# Patient Record
Sex: Male | Born: 1980 | Hispanic: Yes | Marital: Single | Smoking: Current every day smoker
Health system: Southern US, Community
[De-identification: ages and names within clinical notes are randomized; demographics above are authoritative.]

## PROBLEM LIST (undated history)

## (undated) DIAGNOSIS — B20 Human immunodeficiency virus [HIV] disease: Secondary | ICD-10-CM

## (undated) DIAGNOSIS — F410 Panic disorder [episodic paroxysmal anxiety] without agoraphobia: Secondary | ICD-10-CM

## (undated) DIAGNOSIS — R569 Unspecified convulsions: Secondary | ICD-10-CM

## (undated) DIAGNOSIS — G40909 Epilepsy, unspecified, not intractable, without status epilepticus: Secondary | ICD-10-CM

## (undated) DIAGNOSIS — I10 Essential (primary) hypertension: Secondary | ICD-10-CM

## (undated) DIAGNOSIS — Z21 Asymptomatic human immunodeficiency virus [HIV] infection status: Secondary | ICD-10-CM

## (undated) DIAGNOSIS — F419 Anxiety disorder, unspecified: Secondary | ICD-10-CM

## (undated) DIAGNOSIS — G43909 Migraine, unspecified, not intractable, without status migrainosus: Secondary | ICD-10-CM

## (undated) HISTORY — PX: ORIF CLAVICULAR FRACTURE: SHX5055

---

## 2014-07-14 ENCOUNTER — Emergency Department (HOSPITAL_BASED_OUTPATIENT_CLINIC_OR_DEPARTMENT_OTHER): Payer: PRIVATE HEALTH INSURANCE

## 2014-07-14 ENCOUNTER — Encounter (HOSPITAL_BASED_OUTPATIENT_CLINIC_OR_DEPARTMENT_OTHER): Payer: Self-pay | Admitting: Emergency Medicine

## 2014-07-14 ENCOUNTER — Emergency Department (HOSPITAL_BASED_OUTPATIENT_CLINIC_OR_DEPARTMENT_OTHER)
Admission: EM | Admit: 2014-07-14 | Discharge: 2014-07-14 | Disposition: A | Discharge status: Home / Self Care | Payer: PRIVATE HEALTH INSURANCE | Attending: Emergency Medicine | Admitting: Emergency Medicine

## 2014-07-14 DIAGNOSIS — S5401XA Injury of ulnar nerve at forearm level, right arm, initial encounter: Secondary | ICD-10-CM

## 2014-07-14 DIAGNOSIS — Z820 Family history of epilepsy and other diseases of the nervous system: Secondary | ICD-10-CM | POA: Insufficient documentation

## 2014-07-14 DIAGNOSIS — F419 Anxiety disorder, unspecified: Secondary | ICD-10-CM | POA: Insufficient documentation

## 2014-07-14 DIAGNOSIS — Z880 Allergy status to penicillin: Secondary | ICD-10-CM | POA: Insufficient documentation

## 2014-07-14 DIAGNOSIS — Z23 Encounter for immunization: Secondary | ICD-10-CM | POA: Insufficient documentation

## 2014-07-14 DIAGNOSIS — S4361XA Sprain of right sternoclavicular joint, initial encounter: Secondary | ICD-10-CM

## 2014-07-14 DIAGNOSIS — Z8669 Personal history of other diseases of the nervous system and sense organs: Secondary | ICD-10-CM | POA: Insufficient documentation

## 2014-07-14 DIAGNOSIS — Z791 Long term (current) use of non-steroidal anti-inflammatories (NSAID): Secondary | ICD-10-CM | POA: Insufficient documentation

## 2014-07-14 DIAGNOSIS — Z79899 Other long term (current) drug therapy: Secondary | ICD-10-CM | POA: Insufficient documentation

## 2014-07-14 DIAGNOSIS — S4991XA Unspecified injury of right shoulder and upper arm, initial encounter: Secondary | ICD-10-CM | POA: Insufficient documentation

## 2014-07-14 DIAGNOSIS — Z21 Asymptomatic human immunodeficiency virus [HIV] infection status: Secondary | ICD-10-CM | POA: Insufficient documentation

## 2014-07-14 DIAGNOSIS — I10 Essential (primary) hypertension: Secondary | ICD-10-CM | POA: Insufficient documentation

## 2014-07-14 HISTORY — DX: Anxiety disorder, unspecified: F41.9

## 2014-07-14 HISTORY — DX: Unspecified convulsions: R56.9

## 2014-07-14 HISTORY — DX: Asymptomatic human immunodeficiency virus (hiv) infection status: Z21

## 2014-07-14 HISTORY — DX: Human immunodeficiency virus (HIV) disease: B20

## 2014-07-14 HISTORY — DX: Essential (primary) hypertension: I10

## 2014-07-14 MED ORDER — FENTANYL CITRATE 0.05 MG/ML IJ SOLN
100.0000 ug | Freq: Once | INTRAMUSCULAR | Status: AC
  Administered 2014-07-14: 100 ug via INTRAVENOUS
  Filled 2014-07-14: qty 2

## 2014-07-14 MED ORDER — TETANUS-DIPHTH-ACELL PERTUSSIS 5-2.5-18.5 LF-MCG/0.5 IM SUSP
0.5000 mL | Freq: Once | INTRAMUSCULAR | Status: AC
  Administered 2014-07-14: 0.5 mL via INTRAMUSCULAR
  Filled 2014-07-14: qty 0.5

## 2014-07-14 MED ORDER — HYDROCODONE-ACETAMINOPHEN 5-325 MG PO TABS: 1.0000 | ORAL_TABLET | Freq: Four times a day (QID) | ORAL | Status: DC | PRN

## 2014-07-14 MED ORDER — LORAZEPAM 2 MG/ML IJ SOLN
INTRAMUSCULAR | Status: AC
  Filled 2014-07-14: qty 1

## 2014-07-14 MED ORDER — LORAZEPAM 2 MG/ML IJ SOLN
2.0000 mg | Freq: Once | INTRAMUSCULAR | Status: AC
  Administered 2014-07-14: 2 mg via INTRAVENOUS
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
## 2014-07-14 NOTE — ED Notes (Signed)
Patient transported to CT 
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
## 2014-07-17 ENCOUNTER — Inpatient Hospital Stay (HOSPITAL_COMMUNITY)
Admission: EM | Admit: 2014-07-17 | Discharge: 2014-07-19 | DRG: 100 | Disposition: A | Discharge status: Home / Self Care | Payer: PRIVATE HEALTH INSURANCE | Attending: Internal Medicine | Admitting: Internal Medicine

## 2014-07-17 ENCOUNTER — Emergency Department (HOSPITAL_COMMUNITY): Payer: PRIVATE HEALTH INSURANCE

## 2014-07-17 ENCOUNTER — Encounter (HOSPITAL_COMMUNITY): Payer: Self-pay | Admitting: Emergency Medicine

## 2014-07-17 ENCOUNTER — Inpatient Hospital Stay (HOSPITAL_COMMUNITY): Payer: PRIVATE HEALTH INSURANCE

## 2014-07-17 DIAGNOSIS — Z79899 Other long term (current) drug therapy: Secondary | ICD-10-CM

## 2014-07-17 DIAGNOSIS — E870 Hyperosmolality and hypernatremia: Secondary | ICD-10-CM | POA: Diagnosis present

## 2014-07-17 DIAGNOSIS — D649 Anemia, unspecified: Secondary | ICD-10-CM | POA: Diagnosis not present

## 2014-07-17 DIAGNOSIS — F17210 Nicotine dependence, cigarettes, uncomplicated: Secondary | ICD-10-CM | POA: Diagnosis present

## 2014-07-17 DIAGNOSIS — E876 Hypokalemia: Secondary | ICD-10-CM | POA: Diagnosis not present

## 2014-07-17 DIAGNOSIS — R569 Unspecified convulsions: Secondary | ICD-10-CM

## 2014-07-17 DIAGNOSIS — B20 Human immunodeficiency virus [HIV] disease: Secondary | ICD-10-CM

## 2014-07-17 DIAGNOSIS — F445 Conversion disorder with seizures or convulsions: Secondary | ICD-10-CM

## 2014-07-17 DIAGNOSIS — Z21 Asymptomatic human immunodeficiency virus [HIV] infection status: Secondary | ICD-10-CM | POA: Diagnosis present

## 2014-07-17 DIAGNOSIS — I10 Essential (primary) hypertension: Secondary | ICD-10-CM | POA: Diagnosis present

## 2014-07-17 DIAGNOSIS — G40301 Generalized idiopathic epilepsy and epileptic syndromes, not intractable, with status epilepticus: Secondary | ICD-10-CM

## 2014-07-17 DIAGNOSIS — J9610 Chronic respiratory failure, unspecified whether with hypoxia or hypercapnia: Secondary | ICD-10-CM

## 2014-07-17 DIAGNOSIS — Z9114 Patient's other noncompliance with medication regimen: Secondary | ICD-10-CM | POA: Diagnosis present

## 2014-07-17 DIAGNOSIS — G40901 Epilepsy, unspecified, not intractable, with status epilepticus: Principal | ICD-10-CM | POA: Diagnosis present

## 2014-07-17 DIAGNOSIS — J9601 Acute respiratory failure with hypoxia: Secondary | ICD-10-CM | POA: Diagnosis present

## 2014-07-17 LAB — I-STAT ARTERIAL BLOOD GAS, ED
BICARBONATE: 27.1 meq/L — AB (ref 20.0–24.0)
O2 Saturation: 63 %
PO2 ART: 35 mmHg — AB (ref 80.0–100.0)
TCO2: 29 mmol/L (ref 0–100)
pCO2 arterial: 50.7 mmHg — ABNORMAL HIGH (ref 35.0–45.0)
pH, Arterial: 7.336 — ABNORMAL LOW (ref 7.350–7.450)

## 2014-07-17 LAB — COMPREHENSIVE METABOLIC PANEL
ALT: 48 U/L (ref 0–53)
AST: 26 U/L (ref 0–37)
Albumin: 4 g/dL (ref 3.5–5.2)
Alkaline Phosphatase: 101 U/L (ref 39–117)
Anion gap: 12 (ref 5–15)
BUN: 11 mg/dL (ref 6–23)
CO2: 28 mEq/L (ref 19–32)
Calcium: 9.5 mg/dL (ref 8.4–10.5)
Chloride: 108 mEq/L (ref 96–112)
Creatinine, Ser: 0.67 mg/dL (ref 0.50–1.35)
GFR calc Af Amer: >90 mL/min (ref >90)
GFR calc non Af Amer: >90 mL/min (ref >90)
Glucose, Bld: 128 mg/dL — ABNORMAL HIGH (ref 70–99)
Potassium: 4.6 mEq/L (ref 3.7–5.3)
SODIUM: 148 meq/L — AB (ref 137–147)
Total Bilirubin: <0.2 mg/dL — ABNORMAL LOW (ref 0.3–1.2)
Total Protein: 8 g/dL (ref 6.0–8.3)

## 2014-07-17 LAB — RAPID URINE DRUG SCREEN, HOSP PERFORMED
AMPHETAMINES: NOT DETECTED
BENZODIAZEPINES: POSITIVE — AB
Barbiturates: NOT DETECTED
Cocaine: NOT DETECTED
OPIATES: NOT DETECTED
Tetrahydrocannabinol: NOT DETECTED

## 2014-07-17 LAB — URINE MICROSCOPIC-ADD ON

## 2014-07-17 LAB — CBG MONITORING, ED: Glucose-Capillary: 141 mg/dL — ABNORMAL HIGH (ref 70–99)

## 2014-07-17 LAB — URINALYSIS, ROUTINE W REFLEX MICROSCOPIC
Bilirubin Urine: NEGATIVE
GLUCOSE, UA: NEGATIVE mg/dL
Ketones, ur: NEGATIVE mg/dL
Leukocytes, UA: NEGATIVE
Nitrite: NEGATIVE
PH: 6 (ref 5.0–8.0)
PROTEIN: NEGATIVE mg/dL
Specific Gravity, Urine: 1.021 (ref 1.005–1.030)
Urobilinogen, UA: 0.2 mg/dL (ref 0.0–1.0)

## 2014-07-17 LAB — TROPONIN I

## 2014-07-17 LAB — CBC WITH DIFFERENTIAL/PLATELET
Basophils Absolute: 0 10*3/uL (ref 0.0–0.1)
Basophils Relative: 0 % (ref 0–1)
Eosinophils Absolute: 0 10*3/uL (ref 0.0–0.7)
Eosinophils Relative: 0 % (ref 0–5)
HCT: 42.9 % (ref 39.0–52.0)
Hemoglobin: 14.4 g/dL (ref 13.0–17.0)
Lymphocytes Relative: 35 % (ref 12–46)
Lymphs Abs: 2.5 10*3/uL (ref 0.7–4.0)
MCH: 31.6 pg (ref 26.0–34.0)
MCHC: 33.6 g/dL (ref 30.0–36.0)
MCV: 94.1 fL (ref 78.0–100.0)
Monocytes Absolute: 0.5 10*3/uL (ref 0.1–1.0)
Monocytes Relative: 6 % (ref 3–12)
Neutro Abs: 4.1 10*3/uL (ref 1.7–7.7)
Neutrophils Relative %: 59 % (ref 43–77)
Platelets: 222 10*3/uL (ref 150–400)
RBC: 4.56 MIL/uL (ref 4.22–5.81)
RDW: 14 % (ref 11.5–15.5)
WBC: 7 10*3/uL (ref 4.0–10.5)

## 2014-07-17 LAB — MRSA PCR SCREENING: MRSA by PCR: NEGATIVE

## 2014-07-17 LAB — I-STAT CG4 LACTIC ACID, ED: Lactic Acid, Venous: 4.03 mmol/L — ABNORMAL HIGH (ref 0.5–2.2)

## 2014-07-17 LAB — CK: CK TOTAL: 189 U/L (ref 7–232)

## 2014-07-17 LAB — PHENYTOIN LEVEL, TOTAL: Phenytoin Lvl: 4.9 ug/mL — ABNORMAL LOW (ref 10.0–20.0)

## 2014-07-17 MED ORDER — ONDANSETRON HCL 4 MG/2ML IJ SOLN
4.0000 mg | Freq: Once | INTRAMUSCULAR | Status: AC
  Administered 2014-07-17: 4 mg via INTRAVENOUS
  Filled 2014-07-17 (×2): qty 2

## 2014-07-17 MED ORDER — PROPOFOL 10 MG/ML IV EMUL
INTRAVENOUS | Status: AC
  Administered 2014-07-17: 100 mg via INTRAVENOUS
  Filled 2014-07-17: qty 100

## 2014-07-17 MED ORDER — LORAZEPAM 2 MG/ML IJ SOLN
2.0000 mg | Freq: Once | INTRAMUSCULAR | Status: AC
  Administered 2014-07-17: 2 mg via INTRAVENOUS

## 2014-07-17 MED ORDER — SODIUM CHLORIDE 0.9 % IV SOLN
INTRAVENOUS | Status: DC
  Administered 2014-07-17: 100 mL/h via INTRAVENOUS
  Administered 2014-07-17 – 2014-07-18 (×2): via INTRAVENOUS

## 2014-07-17 MED ORDER — LEVETIRACETAM IN NACL 500 MG/100ML IV SOLN
500.0000 mg | Freq: Two times a day (BID) | INTRAVENOUS | Status: DC
  Administered 2014-07-17 – 2014-07-18 (×3): 500 mg via INTRAVENOUS
  Filled 2014-07-17 (×9): qty 100

## 2014-07-17 MED ORDER — CHLORHEXIDINE GLUCONATE 0.12 % MT SOLN
15.0000 mL | Freq: Two times a day (BID) | OROMUCOSAL | Status: DC
  Administered 2014-07-17 – 2014-07-18 (×2): 15 mL via OROMUCOSAL
  Filled 2014-07-17 (×2): qty 15

## 2014-07-17 MED ORDER — FENTANYL CITRATE 0.05 MG/ML IJ SOLN
50.0000 ug | Freq: Once | INTRAMUSCULAR | Status: AC
  Administered 2014-07-17: 50 ug via INTRAVENOUS

## 2014-07-17 MED ORDER — PROPOFOL 10 MG/ML IV EMUL
5.0000 ug/kg/min | Freq: Once | INTRAVENOUS | Status: AC
  Administered 2014-07-17: 20 ug/kg/min via INTRAVENOUS

## 2014-07-17 MED ORDER — SODIUM CHLORIDE 0.9 % IV BOLUS (SEPSIS)
1000.0000 mL | Freq: Once | INTRAVENOUS | Status: AC
  Administered 2014-07-17: 1000 mL via INTRAVENOUS

## 2014-07-17 MED ORDER — LORAZEPAM 2 MG/ML IJ SOLN
INTRAMUSCULAR | Status: AC
  Filled 2014-07-17: qty 1

## 2014-07-17 MED ORDER — CLONAZEPAM 1 MG PO TABS
1.0000 mg | ORAL_TABLET | Freq: Two times a day (BID) | ORAL | Status: DC
  Administered 2014-07-17 – 2014-07-18 (×3): 1 mg
  Filled 2014-07-17 (×2): qty 2
  Filled 2014-07-17: qty 1

## 2014-07-17 MED ORDER — CETYLPYRIDINIUM CHLORIDE 0.05 % MT LIQD
7.0000 mL | Freq: Four times a day (QID) | OROMUCOSAL | Status: DC
  Administered 2014-07-17: 7 mL via OROMUCOSAL

## 2014-07-17 MED ORDER — PHENYTOIN SODIUM 50 MG/ML IJ SOLN: 100.0000 mg | Freq: Three times a day (TID) | INTRAMUSCULAR | Status: DC

## 2014-07-17 MED ORDER — FENTANYL CITRATE 0.05 MG/ML IJ SOLN
100.0000 ug | INTRAMUSCULAR | Status: DC | PRN
  Administered 2014-07-17 – 2014-07-18 (×4): 100 ug via INTRAVENOUS
  Filled 2014-07-17 (×4): qty 2

## 2014-07-17 MED ORDER — FENTANYL CITRATE 0.05 MG/ML IJ SOLN
INTRAMUSCULAR | Status: AC
  Filled 2014-07-17: qty 2

## 2014-07-17 MED ORDER — ROCURONIUM BROMIDE 50 MG/5ML IV SOLN
50.0000 mg | Freq: Once | INTRAVENOUS | Status: AC
  Administered 2014-07-17: 50 mg via INTRAVENOUS

## 2014-07-17 MED ORDER — PANTOPRAZOLE SODIUM 40 MG IV SOLR
40.0000 mg | Freq: Every day | INTRAVENOUS | Status: DC
  Administered 2014-07-17: 40 mg via INTRAVENOUS
  Filled 2014-07-17 (×2): qty 40

## 2014-07-17 MED ORDER — SODIUM CHLORIDE 0.9 % IV SOLN
500.0000 mg | Freq: Once | INTRAVENOUS | Status: AC
  Administered 2014-07-17: 500 mg via INTRAVENOUS
  Filled 2014-07-17: qty 10

## 2014-07-17 MED ORDER — EMTRICITABINE-TENOFOVIR DF 200-300 MG PO TABS
1.0000 | ORAL_TABLET | Freq: Every day | ORAL | Status: DC
  Administered 2014-07-17: 1
  Filled 2014-07-17: qty 1

## 2014-07-17 MED ORDER — MORPHINE SULFATE 4 MG/ML IJ SOLN
4.0000 mg | Freq: Once | INTRAMUSCULAR | Status: DC
  Filled 2014-07-17 (×2): qty 1

## 2014-07-17 MED ORDER — LEVETIRACETAM IN NACL 500 MG/100ML IV SOLN
500.0000 mg | Freq: Two times a day (BID) | INTRAVENOUS | Status: DC
  Filled 2014-07-17 (×2): qty 100

## 2014-07-17 MED ORDER — PROPOFOL 10 MG/ML IV EMUL
20.0000 ug/kg/min | Freq: Once | INTRAVENOUS | Status: AC
  Administered 2014-07-17: 20 ug/kg/min via INTRAVENOUS

## 2014-07-17 MED ORDER — LORAZEPAM 2 MG/ML IJ SOLN
4.0000 mg | Freq: Once | INTRAMUSCULAR | Status: AC
  Administered 2014-07-17: 2 mg via INTRAVENOUS
  Filled 2014-07-17: qty 2

## 2014-07-17 MED ORDER — CETYLPYRIDINIUM CHLORIDE 0.05 % MT LIQD
7.0000 mL | Freq: Four times a day (QID) | OROMUCOSAL | Status: DC
  Administered 2014-07-18 (×3): 7 mL via OROMUCOSAL

## 2014-07-17 MED ORDER — PROPOFOL 10 MG/ML IV EMUL
5.0000 ug/kg/min | INTRAVENOUS | Status: DC
  Administered 2014-07-17: 75 ug/kg/min via INTRAVENOUS
  Administered 2014-07-17: 65 ug/kg/min via INTRAVENOUS
  Administered 2014-07-17: 100 mg via INTRAVENOUS
  Filled 2014-07-17 (×3): qty 100

## 2014-07-17 MED ORDER — PHENYTOIN SODIUM 50 MG/ML IJ SOLN
100.0000 mg | Freq: Three times a day (TID) | INTRAMUSCULAR | Status: DC
  Administered 2014-07-18 – 2014-07-19 (×4): 100 mg via INTRAVENOUS
  Filled 2014-07-17 (×6): qty 2

## 2014-07-17 MED ORDER — LORAZEPAM 2 MG/ML IJ SOLN
INTRAMUSCULAR | Status: AC
  Filled 2014-07-17: qty 2

## 2014-07-17 MED ORDER — CHLORHEXIDINE GLUCONATE 0.12 % MT SOLN: 15.0000 mL | Freq: Two times a day (BID) | OROMUCOSAL | Status: DC

## 2014-07-17 NOTE — ED Notes (Signed)
Per [facility], pt with hx of epilepsy, partner came home and found him lying prone on the bed seizing. Seized for 15 min before EMS arrived and for 30 min during route. Given D50 with CBG of 65,  10 mg versed. 18g to LFA. Placed on NRB. Pt continues to seize on arrival to ED.

## 2014-07-17 NOTE — ED Notes (Signed)
Lactic acid results given to Dr. Nanavati 

## 2014-07-17 NOTE — ED Notes (Signed)
Seizing noted on pt, shaking all over; is tense.

## 2014-07-17 NOTE — Consult Note (Addendum)
NEURO HOSPITALIST CONSULT NOTE    Reason for Consult: SE  HPI:                                                                                                                                          Javier Hill is an 33 y.o. male with a past medical history significant for HTN, anxiety, HIV positive, and epilepsy since age 19 years old, brought in via EMS due to status epilepticus. Patient is sedated with propofol, intubated on the vent and thus all information is gathered from his chart and from his fiance who is at the bedside. Patient's fiance stated tat he found him unresponsive having and having a very prolonged seizure at home lasting at least 30 minutes without fully regaining consciousness and ambulance was summoned. EMS arrived and seized for 30 min during route. Given D50 with CBG of 65, 10 mg versed.  As per his fiance, patient usually takes keppra 500 mg BID, dilantin 300 mg daily, and clonazepam and said that his fiance " will always have a seizure when he is not taking clonazepam and he ran out that medication several days ago". Upon arrival to the ED patient remained having generalized shakiness and was intubated and started on propofol. During my assessment continued having intermittent generalized jerkiness lasting for several seconds and afterwards he is able to follow commands and coherently write few sentences. No recent fever, infection, or poor compliance with AED. CT head negative for acute abnormality.   Past Medical History  Diagnosis Date  . Hypertension   . Anxiety epi  . HIV (human immunodeficiency virus infection)   . Seizure     History reviewed. No pertinent past surgical history.  History reviewed. No pertinent family history.   Social History:  reports that he has been smoking.  He does not have any smokeless tobacco history on file. He reports that he does not drink alcohol or use illicit drugs.  Allergies  Allergen  Reactions  . Aloprim [Allopurinol]   . Bactericin [Bacitracin]   . Caffeine   . Penicillins   . Zoloft [Sertraline Hcl]     MEDICATIONS:  I have reviewed the patient's current medications. Scheduled: . antiseptic oral rinse  7 mL Mouth Rinse QID  . chlorhexidine  15 mL Mouth Rinse BID  . clonazePAM  1 mg Per Tube BID  . emtricitabine-tenofovir  1 tablet Per Tube Daily  . levETIRAcetam  500 mg Intravenous Q12H  . pantoprazole (PROTONIX) IV  40 mg Intravenous Daily     ROS: unable to obtain due to intubation                                                                                                                                     History obtained from chart review and fiance.  Physical exam: intubated on the vent, no apparent distress. Blood pressure 133/87, pulse 89, temperature 98.9 F (37.2 C), temperature source Temporal, resp. rate 17, height 5' 4"  (1.626 m), weight 58.514 kg (129 lb), SpO2 97.00%. Head: normocephalic. Neck: supple, no bruits, no JVD. Cardiac: no murmurs. Lungs: clear. Abdomen: soft, no tender, no mass. Extremities: no edema. Neurologic Examination:                                                                                                      General: Mental Status:  On propofol but alert and  able to follow 3 step commands without difficulty. Cranial Nerves: II: Discs flat bilaterally; Visual fields grossly normal, pupils equal, round, reactive to light III,IV, VI: ptosis not present, extra-ocular motions intact bilaterally V,VII: no obvious facial weakness, facial light touch sensation normal bilaterally VIII: hearing normal bilaterally IX,X: gag reflex: intubated XI: bilateral shoulder shrug no tested XII: tongue intubated Motor: Right leg with decreased motor function s/p recent trauma, but otherwise no focal  weakness. Tone and bulk:normal tone throughout; no atrophy noted Sensory: Pinprick intact Deep Tendon Reflexes:  Right: Upper Extremity   Left: Upper extremity   biceps (C-5 to C-6) 2/4   biceps (C-5 to C-6) 2/4 tricep (C7) 2/4    triceps (C7) 2/4 Brachioradialis (C6) 2/4  Brachioradialis (C6) 2/4  Lower Extremity Lower Extremity  quadriceps (L-2 to L-4) 2/4   quadriceps (L-2 to L-4) 2/4 Achilles (S1) 2/4   Achilles (S1) 2/4  Plantars: Right: downgoing   Left: downgoing Cerebellar: Unable to test Gait:  Unable to test    No results found for this basename: cbc, bmp, coags, chol, tri, ldl, hga1c    Results for orders placed during the hospital encounter of 07/17/14 (from the past 48 hour(s))  CBC WITH DIFFERENTIAL  Status: None   Collection Time    07/17/14  2:00 PM      Result Value Ref Range   WBC 7.0  4.0 - 10.5 K/uL   RBC 4.56  4.22 - 5.81 MIL/uL   Hemoglobin 14.4  13.0 - 17.0 g/dL   HCT 42.9  39.0 - 52.0 %   MCV 94.1  78.0 - 100.0 fL   MCH 31.6  26.0 - 34.0 pg   MCHC 33.6  30.0 - 36.0 g/dL   RDW 14.0  11.5 - 15.5 %   Platelets 222  150 - 400 K/uL   Neutrophils Relative % 59  43 - 77 %   Neutro Abs 4.1  1.7 - 7.7 K/uL   Lymphocytes Relative 35  12 - 46 %   Lymphs Abs 2.5  0.7 - 4.0 K/uL   Monocytes Relative 6  3 - 12 %   Monocytes Absolute 0.5  0.1 - 1.0 K/uL   Eosinophils Relative 0  0 - 5 %   Eosinophils Absolute 0.0  0.0 - 0.7 K/uL   Basophils Relative 0  0 - 1 %   Basophils Absolute 0.0  0.0 - 0.1 K/uL  COMPREHENSIVE METABOLIC PANEL     Status: Abnormal   Collection Time    07/17/14  2:00 PM      Result Value Ref Range   Sodium 148 (*) 137 - 147 mEq/L   Potassium 4.6  3.7 - 5.3 mEq/L   Chloride 108  96 - 112 mEq/L   CO2 28  19 - 32 mEq/L   Glucose, Bld 128 (*) 70 - 99 mg/dL   BUN 11  6 - 23 mg/dL   Creatinine, Ser 0.67  0.50 - 1.35 mg/dL   Calcium 9.5  8.4 - 10.5 mg/dL   Total Protein 8.0  6.0 - 8.3 g/dL   Albumin 4.0  3.5 - 5.2 g/dL   AST 26   0 - 37 U/L   Comment: HEMOLYSIS AT THIS LEVEL MAY AFFECT RESULT   ALT 48  0 - 53 U/L   Alkaline Phosphatase 101  39 - 117 U/L   Total Bilirubin <0.2 (*) 0.3 - 1.2 mg/dL   GFR calc non Af Amer >90  >90 mL/min   GFR calc Af Amer >90  >90 mL/min   Comment: (NOTE)     The eGFR has been calculated using the CKD EPI equation.     This calculation has not been validated in all clinical situations.     eGFR's persistently <90 mL/min signify possible Chronic Kidney     Disease.   Anion gap 12  5 - 15  CK     Status: None   Collection Time    07/17/14  2:00 PM      Result Value Ref Range   Total CK 189  7 - 232 U/L  TROPONIN I     Status: None   Collection Time    07/17/14  2:00 PM      Result Value Ref Range   Troponin I <0.30  <0.30 ng/mL   Comment:            Due to the release kinetics of cTnI,     a negative result within the first hours     of the onset of symptoms does not rule out     myocardial infarction with certainty.     If myocardial infarction is still suspected,     repeat the test  at appropriate intervals.  I-STAT CG4 LACTIC ACID, ED     Status: Abnormal   Collection Time    07/17/14  2:56 PM      Result Value Ref Range   Lactic Acid, Venous 4.03 (*) 0.5 - 2.2 mmol/L    Dg Chest Port 1 View  07/17/2014   CLINICAL DATA:  Status epilepticus.  Seizures.  Initial encounter.  EXAM: PORTABLE CHEST - 1 VIEW  COMPARISON:  07/14/2014.  FINDINGS: Support apparatus: Endotracheal tube tip is 9 mm from the carina. This should be retracted about 1 cm for better positioning. Enteric tube is present in the stomach.  Cardiomediastinal Silhouette:  Within normal limits.  Lungs: Basilar atelectasis associated with markedly low lung volumes. This is greater on the LEFT than RIGHT. No pneumothorax.  Effusions:  None.  Other:  Monitoring leads project over the chest.  IMPRESSION: 1. Low position of the endotracheal tube tip, 9 mm from the carina. 2. Low volume chest with basilar atelectasis.    Electronically Signed   By: Dereck Ligas M.D.   On: 07/17/2014 15:12   Assessment/Plan: 33 y/o with HIV and known seizures since age 89 years old, presents after sustaining repetitive and prolonged seizures. Although patient can certainly has real epileptic seizures, I am concerned that the witnessed seizures while in the ED are non epileptic seizures and I am absolutely confident that at this time he is not on status epilepticus. Will resume his habitual AED regimen. Hopefully he can get extubated soon. Will follow up.  Dorian Pod, MD 07/17/2014, 3:55 PM [facility]

## 2014-07-17 NOTE — ED Notes (Signed)
Camillo MD at bedside.
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
## 2014-07-17 NOTE — ED Notes (Signed)
Paged PCCM to [phone] 

## 2014-07-17 NOTE — ED Notes (Signed)
Paged Dr. Leroy Kennedyamilo to [phone]

## 2014-07-17 NOTE — ED Notes (Signed)
Pt noted to be writing notes with pen and paper. Pt writing clearly, "The plastic thing and saliva in my mouth is hurting".

## 2014-07-17 NOTE — Progress Notes (Signed)
MEDICATION RELATED CONSULT NOTE - INITIAL   Pharmacy Consult for Phenytoin Indication: Seizure  Allergies  Allergen Reactions  . Aloprim [Allopurinol]   . Bactericin [Bacitracin]   . Caffeine   . Penicillins   . Zoloft [Sertraline Hcl]    Patient Measurements: Height: 5\' 4"  (162.6 cm) Weight: 129 lb (58.514 kg) IBW/kg (Calculated) : 59.2  Vital Signs: Temp: 98.9 F (37.2 C) (10/20 1447) Temp Source: Temporal (10/20 1447) BP: 133/87 mmHg (10/20 1530) Pulse Rate: 89 (10/20 1530)  Labs:  Recent Labs  07/17/14 1400  WBC 7.0  HGB 14.4  HCT 42.9  PLT 222  CREATININE 0.67  ALBUMIN 4.0  PROT 8.0  AST 26  ALT 48  ALKPHOS 101  BILITOT <0.2*   Estimated Creatinine Clearance: 109.7 ml/min (by C-G formula based on Cr of 0.67).  Microbiology: No results found for this or any previous visit (from the past 720 hour(s)).  Medical History: Past Medical History  Diagnosis Date  . Hypertension   . Anxiety epi  . HIV (human immunodeficiency virus infection)   . Seizure    Assessment: 33 y/o presents from home seizing for 15 min before EMS arrived and 30 minutes during route.  Patient phenytoin and keppra PTA.  Free phenytoin level 4.9, correct phenytoin level 5.4. Restart phenytoin for seizures inpatient.   Goal of Therapy:  Phenytoin level 10-20  Plan:  Phenytoin IV 500 mg for loading dose F/u 5-7 day phenytoin level  Marisue BrooklynGazda, Nicholas 07/17/2014,3:58 PM   Note has been reviewed, I agree with the assessment and plan.   Agapito GamesAlison Domenik Trice, PharmD, BCPS Clinical Pharmacist Pager: [phone] 07/17/2014 5:25 PM

## 2014-07-17 NOTE — Progress Notes (Signed)
ABG sample was venous, MD said not to redraw

## 2014-07-17 NOTE — Progress Notes (Signed)
Called to patient room by Nurse tech.  Patient rigid and shaking in bed, non responsive to verbal command. Notified eLink for assistance.  Marland Kitchen.CSM

## 2014-07-17 NOTE — H&P (Signed)
PULMONARY / CRITICAL CARE MEDICINE   Name: Javier Hill MRN: [id number] DOB: 1981-05-10    ADMISSION DATE:  07/17/2014 CONSULTATION DATE:  10/20  REFERRING MD :   Rhunette CroftNanavati   CHIEF COMPLAINT:   Seizure   INITIAL PRESENTATION:  33 year old male w/ known h/o HIV and seizure d/o. Reportedly not taking either his keppra or klonopin. Presented to ER possible break-thru seizure. Eventually required intubation after several doses of benzos and persistent seizure activity. PCCM asked to admit   STUDIES:  CT head 10/20>>> EEG 10/20>>>  SIGNIFICANT EVENTS:    HISTORY OF PRESENT ILLNESS:  This is a 33 year old male w/ known h/o HIV (on anti-retrovirals), and h/o seizure d/o. Per his fiance he had not been taking his Klonopin for several days. He also told the ED staff he had not been taking his Keppra. He was found by his Fiance unresponsive having what the fiance felt was a seizure. He presented to the ER w/ seizure type activity. Received several doses of ativan with on-going seizure activity. He was intubated by EDP, placed on a diprivan gtt and PCCM asked to admit. On PCCM arrival the pt was awake, following commands, writing notes to his fiance. He had several repeated episodes of rapid shaking of his upper and lower extremities but immediately regained consciousness and was writing notes.    PAST MEDICAL HISTORY :   has a past medical history of Hypertension; Anxiety (epi); HIV (human immunodeficiency virus infection); and Seizure.  has no past surgical history on file. Prior to Admission medications   Medication Sig Start Date End Date Taking? Authorizing Provider  ALPRAZolam Prudy Feeler(XANAX) 1 MG tablet Take 1 mg by mouth 2 (two) times daily.    Historical Provider, MD  clonazePAM (KLONOPIN) 1 MG tablet Take 1 mg by mouth 2 (two) times daily.    Historical Provider, MD  diclofenac (VOLTAREN) 25 MG EC tablet Take 25 mg by mouth 2 (two) times daily.    Historical Provider, MD   emtricitabine-tenofovir (TRUVADA) 200-300 MG per tablet Take 1 tablet by mouth daily.    Historical Provider, MD  HYDROcodone-acetaminophen (NORCO/VICODIN) 5-325 MG per tablet Take 1-2 tablets by mouth every 6 (six) hours as needed (for pain). 07/14/14   Carlisle BeersJohn L Molpus, MD  levETIRAcetam (KEPPRA) 250 MG tablet Take 250 mg by mouth 2 (two) times daily.    Historical Provider, MD  lisinopril (PRINIVIL,ZESTRIL) 20 MG tablet Take 20 mg by mouth daily.    Historical Provider, MD  phenytoin (DILANTIN) 300 MG ER capsule Take 300 mg by mouth 3 (three) times daily.    Historical Provider, MD   Allergies  Allergen Reactions  . Aloprim [Allopurinol]   . Bactericin [Bacitracin]   . Caffeine   . Penicillins   . Zoloft [Sertraline Hcl]     FAMILY HISTORY:  has no family status information on file.  SOCIAL HISTORY:  reports that he has been smoking.  He does not have any smokeless tobacco history on file. He reports that he does not drink alcohol or use illicit drugs.  REVIEW OF SYSTEMS:   Not able   SUBJECTIVE:  Sedated  VITAL SIGNS: Temp:  [98.9 F (37.2 C)] 98.9 F (37.2 C) (10/20 1447) Pulse Rate:  [89-105] 89 (10/20 1530) Resp:  [14-29] 17 (10/20 1530) BP: (133-160)/(87-119) 133/87 mmHg (10/20 1530) SpO2:  [97 %-100 %] 97 % (10/20 1530) FiO2 (%):  [100 %] 100 % (10/20 1447) Weight:  [58.514 kg (129 lb)] 58.514  kg (129 lb) (10/20 1434) HEMODYNAMICS:   VENTILATOR SETTINGS: Vent Mode:  [-] PRVC FiO2 (%):  [100 %] 100 % Set Rate:  [16 bmp] 16 bmp Vt Set:  [470 mL] 470 mL PEEP:  [5 cmH20] 5 cmH20 Plateau Pressure:  [18 cmH20] 18 cmH20 INTAKE / OUTPUT: No intake or output data in the 24 hours ending 07/17/14 1548  PHYSICAL EXAMINATION: General:  Sedated on diprivan gtt  Neuro:  Awake, follows commands. Has had several episodes of shaking of legs and arms.  HEENT:  [place], no JVD, orally intubated  Cardiovascular:  rrr Lungs:  Scattered rhonchi  Abdomen:  Soft, non-tender + bowel  sounds  Musculoskeletal:  Intact  Skin:  Intact   LABS:  CBC  Recent Labs Lab 07/17/14 1400  WBC 7.0  HGB 14.4  HCT 42.9  PLT 222   Coag's No results found for this basename: APTT, INR,  in the last 168 hours BMET  Recent Labs Lab 07/17/14 1400  NA 148*  K 4.6  CL 108  CO2 28  BUN 11  CREATININE 0.67  GLUCOSE 128*   Electrolytes  Recent Labs Lab 07/17/14 1400  CALCIUM 9.5   Sepsis Markers  Recent Labs Lab 07/17/14 1456  LATICACIDVEN 4.03*   ABG No results found for this basename: PHART, PCO2ART, PO2ART,  in the last 168 hours Liver Enzymes  Recent Labs Lab 07/17/14 1400  AST 26  ALT 48  ALKPHOS 101  BILITOT <0.2*  ALBUMIN 4.0   Cardiac Enzymes  Recent Labs Lab 07/17/14 1400  TROPONINI <0.30   Glucose No results found for this basename: GLUCAP,  in the last 168 hours  Imaging No results found. Basilar atx   ASSESSMENT / PLAN:  PULMONARY OETT 10/20>>> A: acute respiratory failure in the setting of AMS from possible seizure  P:   Full vent support PAD protocol; see neuro section re: RAS goal  F/u abg   CARDIOVASCULAR CVL A:  No acute  P:  Admit to ICU  Tele   RENAL A:   Mild hypernatremia  P:   Cont NS for IVFs F/u chemistry   GASTROINTESTINAL A:  No acute  P:   SUP w/ PPI   HEMATOLOGIC A:   No acute  P:  Trend fever curve Pas add [place] heparin if CT head neg   INFECTIOUS A: fever       H/o HIV  P:   BCx2 10/20>>> UC 10/20>>> Sputum 10/20>>> Cont antiretro-virals  ENDOCRINE A:  No acute  P:   Trend glucose   NEUROLOGIC A:   H/o seizure d/o Break-thru seizure  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   - Inter-disciplinary family meet or Palliative Care meeting due by:  10/27        TODAY'S SUMMARY: 33 y/o male with HIV here with seizure  activity leading to intubation in ED.  Puzzling story as his movements after initial presentation in ED are not consistent with true seizure disorder.  Suspect some component of pseudoseizure.  Considering complexity of case will keep him sedated, intubated and on propofol until EEG performed.  Appreciate neurology.   I have personally obtained a history, examined the patient, evaluated laboratory and imaging results, formulated the assessment and plan and placed orders. CRITICAL CARE: The patient is critically ill with multiple organ  systems failure and requires high complexity decision making for assessment and support, frequent evaluation and titration of therapies, application of advanced monitoring technologies and extensive interpretation of multiple databases. Critical Care Time devoted to patient care services described in this note is 45 minutes.   Heber CarolinaBrent McQuaid, MD [place] [facility] Pager: [phone] Cell: [phone] If no response, call [phone]   07/17/2014, 3:48 PM

## 2014-07-17 NOTE — Progress Notes (Signed)
Upon initial assessments, patient restrained with bilateral soft wrist restraints, order is active. Patient is restless at times, will continue to assess need for restraints through shift.  Corliss SkainsJuan Niamya Vittitow RN
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
## 2014-07-18 ENCOUNTER — Ambulatory Visit (HOSPITAL_COMMUNITY): Payer: PRIVATE HEALTH INSURANCE

## 2014-07-18 DIAGNOSIS — G4089 Other seizures: Secondary | ICD-10-CM

## 2014-07-18 LAB — BASIC METABOLIC PANEL
Anion gap: 11 (ref 5–15)
BUN: 10 mg/dL (ref 6–23)
CALCIUM: 8.4 mg/dL (ref 8.4–10.5)
CO2: 26 mEq/L (ref 19–32)
CREATININE: 0.65 mg/dL (ref 0.50–1.35)
Chloride: 110 mEq/L (ref 96–112)
GFR calc Af Amer: >90 mL/min (ref >90)
GFR calc non Af Amer: >90 mL/min (ref >90)
GLUCOSE: 80 mg/dL (ref 70–99)
Potassium: 3.5 mEq/L — ABNORMAL LOW (ref 3.7–5.3)
Sodium: 147 mEq/L (ref 137–147)

## 2014-07-18 LAB — URINE CULTURE
COLONY COUNT: NO GROWTH
CULTURE: NO GROWTH

## 2014-07-18 LAB — CBC
HEMATOCRIT: 34.8 % — AB (ref 39.0–52.0)
Hemoglobin: 11.6 g/dL — ABNORMAL LOW (ref 13.0–17.0)
MCH: 31.5 pg (ref 26.0–34.0)
MCHC: 33.3 g/dL (ref 30.0–36.0)
MCV: 94.6 fL (ref 78.0–100.0)
Platelets: 171 10*3/uL (ref 150–400)
RBC: 3.68 MIL/uL — ABNORMAL LOW (ref 4.22–5.81)
RDW: 14.2 % (ref 11.5–15.5)
WBC: 6.6 10*3/uL (ref 4.0–10.5)

## 2014-07-18 LAB — GLUCOSE, CAPILLARY: Glucose-Capillary: 99 mg/dL (ref 70–99)

## 2014-07-18 LAB — PHOSPHORUS: Phosphorus: 2.7 mg/dL (ref 2.3–4.6)

## 2014-07-18 LAB — MAGNESIUM: Magnesium: 1.8 mg/dL (ref 1.5–2.5)

## 2014-07-18 MED ORDER — POTASSIUM CHLORIDE 20 MEQ/15ML (10%) PO LIQD
20.0000 meq | ORAL | Status: AC
  Administered 2014-07-18 (×2): 20 meq
  Filled 2014-07-18 (×2): qty 15

## 2014-07-18 MED ORDER — EMTRICITABINE-TENOFOVIR DF 200-300 MG PO TABS
1.0000 | ORAL_TABLET | Freq: Every day | ORAL | Status: DC
  Administered 2014-07-18 – 2014-07-19 (×2): 1 via ORAL
  Filled 2014-07-18 (×2): qty 1

## 2014-07-18 MED ORDER — HEPARIN SODIUM (PORCINE) 5000 UNIT/ML IJ SOLN
5000.0000 [IU] | Freq: Three times a day (TID) | INTRAMUSCULAR | Status: DC
Start: 2014-07-18 — End: 2014-07-18
  Administered 2014-07-18: 5000 [IU] via SUBCUTANEOUS
  Filled 2014-07-18: qty 1

## 2014-07-18 MED ORDER — INFLUENZA VAC SPLIT QUAD 0.5 ML IM SUSY
0.5000 mL | PREFILLED_SYRINGE | INTRAMUSCULAR | Status: AC
  Administered 2014-07-19: 0.5 mL via INTRAMUSCULAR
  Filled 2014-07-18: qty 0.5

## 2014-07-18 MED ORDER — RALTEGRAVIR POTASSIUM 400 MG PO TABS
400.0000 mg | ORAL_TABLET | Freq: Two times a day (BID) | ORAL | Status: DC
  Administered 2014-07-18 – 2014-07-19 (×3): 400 mg via ORAL
  Filled 2014-07-18 (×6): qty 1

## 2014-07-18 MED ORDER — ENOXAPARIN SODIUM 40 MG/0.4ML ~~LOC~~ SOLN
40.0000 mg | SUBCUTANEOUS | Status: DC
  Administered 2014-07-18 – 2014-07-19 (×2): 40 mg via SUBCUTANEOUS
  Filled 2014-07-18 (×2): qty 0.4

## 2014-07-18 MED ORDER — FENTANYL CITRATE 0.05 MG/ML IJ SOLN
25.0000 ug | INTRAMUSCULAR | Status: DC | PRN
  Administered 2014-07-18 – 2014-07-19 (×6): 25 ug via INTRAVENOUS
  Filled 2014-07-18 (×9): qty 2

## 2014-07-18 NOTE — Progress Notes (Signed)
1553 - pt arrived to unit per wheelchair from 2MW with RN. No distress noted. Will monitor   Andrew AuVafiadis, Pamala Hayman I 07/18/2014 3:58 PM

## 2014-07-18 NOTE — ED Provider Notes (Addendum)
CSN: [id number]     Arrival date & time 07/17/14  1429 History   First MD Initiated Contact with Patient 07/17/14 1440     Chief Complaint  Patient presents with  . Seizures     (Consider location/radiation/quality/duration/timing/severity/associated sxs/prior Treatment) HPI Comments: Pt brought in to the ER with seizures. LEVEL 5 CAVEAT FOR ACTIVE SEIZURES. Pt with hx of HIV, seizures comes in with active seizures. Partner reports that pt was seizing when he arrived at home, and pt continued to seize for 10 minutes constant, and so he called EMS. EMS gave patient a total of 10 iv versed with no change. Pt seized for the whole time he was under EMS care.  Partner reports hx of seizures, with status and intubations. Pt also had a fever y'day, 100. No AIDS hx. Pt is not using drugs.  Patient is a 33 y.o. male presenting with seizures. The history is provided by a friend.  Seizures   Past Medical History  Diagnosis Date  . Hypertension   . Anxiety epi  . HIV (human immunodeficiency virus infection)   . Seizure    History reviewed. No pertinent past surgical history. History reviewed. No pertinent family history. History  Substance Use Topics  . Smoking status: Current Every Day Smoker  . Smokeless tobacco: Not on file  . Alcohol Use: No    Review of Systems  Unable to perform ROS: Mental status change  Neurological: Positive for seizures.      Allergies  Aloprim; Bactericin; Bactrim; Caffeine; Haldol; Penicillins; Zoloft; and Watermelon flavor  Home Medications   Prior to Admission medications   Medication Sig Start Date End Date Taking? Authorizing Provider  ALPRAZolam Prudy Feeler(XANAX) 1 MG tablet Take 1 mg by mouth 2 (two) times daily.   Yes Historical Provider, MD  clonazePAM (KLONOPIN) 1 MG tablet Take 0.5-1 mg by mouth 2 (two) times daily.    Yes Historical Provider, MD  emtricitabine-tenofovir (TRUVADA) 200-300 MG per tablet Take 1 tablet by mouth daily.   Yes  Historical Provider, MD  levETIRAcetam (KEPPRA) 500 MG tablet Take 500 mg by mouth 2 (two) times daily.   Yes Historical Provider, MD  lisinopril (PRINIVIL,ZESTRIL) 20 MG tablet Take 20 mg by mouth daily.   Yes Historical Provider, MD  omeprazole (PRILOSEC) 40 MG capsule Take 40 mg by mouth daily.   Yes Historical Provider, MD  phenytoin (DILANTIN) 300 MG ER capsule Take 300 mg by mouth at bedtime.   Yes Historical Provider, MD  raltegravir (ISENTRESS) 400 MG tablet Take 400 mg by mouth 2 (two) times daily.   Yes Historical Provider, MD   BP 129/79  Pulse 84  Temp(Src) 99.9 F (37.7 C) (Core (Comment))  Resp 14  Ht 5\' 4"  (1.626 m)  Wt 137 lb 2 oz (62.2 kg)  BMI 23.53 kg/m2  SpO2 100% Physical Exam  Nursing note and vitals reviewed. Constitutional: He appears well-developed.  Eyes: Pupils are equal, round, and reactive to light.  Pulmonary/Chest: Effort normal.  Abdominal: He exhibits no distension.  Musculoskeletal: He exhibits no edema.  Skin: Skin is warm. No rash noted.    ED Course  OG placement Date/Time: 07/17/2014 2:42 PM Performed by: Derwood KaplanNANAVATI, Devlin Brink Authorized by: Derwood KaplanNANAVATI, Jeanne Diefendorf Consent: The procedure was performed in an emergent situation. Required items: required blood products, implants, devices, and special equipment available Time out: Immediately prior to procedure a "time out" was called to verify the correct patient, procedure, equipment, support staff and site/side marked as required. Patient  tolerance: Patient tolerated the procedure well with no immediate complications.   (including critical care time) Labs Review Labs Reviewed  COMPREHENSIVE METABOLIC PANEL - Abnormal; Notable for the following:    Sodium 148 (*)    Glucose, Bld 128 (*)    Total Bilirubin <0.2 (*)    All other components within normal limits  URINALYSIS, ROUTINE W REFLEX MICROSCOPIC - Abnormal; Notable for the following:    Hgb urine dipstick SMALL (*)    All other components within  normal limits  URINE RAPID DRUG SCREEN (HOSP PERFORMED) - Abnormal; Notable for the following:    Benzodiazepines POSITIVE (*)    All other components within normal limits  PHENYTOIN LEVEL, TOTAL - Abnormal; Notable for the following:    Phenytoin Lvl 4.9 (*)    All other components within normal limits  BASIC METABOLIC PANEL - Abnormal; Notable for the following:    Potassium 3.5 (*)    All other components within normal limits  CBC - Abnormal; Notable for the following:    RBC 3.68 (*)    Hemoglobin 11.6 (*)    HCT 34.8 (*)    All other components within normal limits  I-STAT CG4 LACTIC ACID, ED - Abnormal; Notable for the following:    Lactic Acid, Venous 4.03 (*)    All other components within normal limits  I-STAT ARTERIAL BLOOD GAS, ED - Abnormal; Notable for the following:    pH, Arterial 7.336 (*)    pCO2 arterial 50.7 (*)    pO2, Arterial 35.0 (*)    Bicarbonate 27.1 (*)    All other components within normal limits  CBG MONITORING, ED - Abnormal; Notable for the following:    Glucose-Capillary 141 (*)    All other components within normal limits  MRSA PCR SCREENING  CULTURE, BLOOD (ROUTINE X 2)  CULTURE, BLOOD (ROUTINE X 2)  URINE CULTURE  CULTURE, RESPIRATORY (NON-EXPECTORATED)  CBC WITH DIFFERENTIAL  CK  TROPONIN I  MAGNESIUM  PHOSPHORUS  URINE MICROSCOPIC-ADD ON  GLUCOSE, CAPILLARY    Imaging Review Ct Head Wo Contrast  07/17/2014   CLINICAL DATA:  Seizures  EXAM: CT HEAD WITHOUT CONTRAST  TECHNIQUE: Contiguous axial images were obtained from the base of the skull through the vertex without intravenous contrast.  COMPARISON:  None.  FINDINGS: No skull fracture is noted. Mild mucosal thickening bilateral ethmoid air cells. The mastoid air cells are unremarkable.  No intracranial hemorrhage, mass effect or midline shift. No hydrocephalus. No acute cortical infarction. The gray and white-matter differentiation is preserved. No mass lesion is noted on this  unenhanced scan.  IMPRESSION: No acute intracranial abnormality. Mucosal thickening bilateral ethmoid air cells.   Electronically Signed   By: Natasha MeadLiviu  Pop M.D.   On: 07/17/2014 16:54   Dg Chest Port 1 View  07/17/2014   CLINICAL DATA:  Status epilepticus.  Seizures.  Initial encounter.  EXAM: PORTABLE CHEST - 1 VIEW  COMPARISON:  07/14/2014.  FINDINGS: Support apparatus: Endotracheal tube tip is 9 mm from the carina. This should be retracted about 1 cm for better positioning. Enteric tube is present in the stomach.  Cardiomediastinal Silhouette:  Within normal limits.  Lungs: Basilar atelectasis associated with markedly low lung volumes. This is greater on the LEFT than RIGHT. No pneumothorax.  Effusions:  None.  Other:  Monitoring leads project over the chest.  IMPRESSION: 1. Low position of the endotracheal tube tip, 9 mm from the carina. 2. Low volume chest with basilar atelectasis.  Electronically Signed   By: Andreas Newport M.D.   On: 07/17/2014 15:12     EKG Interpretation None      MDM   Final diagnoses:  Status epilepticus   INTUBATION Performed by: Derwood Kaplan  Required items: required blood products, implants, devices, and special equipment available Patient identity confirmed: provided demographic data and hospital-assigned identification number Time out: Immediately prior to procedure a "time out" was called to verify the correct patient, procedure, equipment, support staff and site/side marked as required.  Indications: status epilepticus, airway protection  Intubation method: Glidescope Laryngoscopy   Preoxygenation: BVM  Sedatives: 100 mg propofol Paralytic: 50  rocoronium  Tube Size: 7.5 cuffed  Post-procedure assessment: chest rise and ETCO2 monitor Breath sounds: equal and absent over the epigastrium Tube secured with: ETT holder Chest x-ray interpreted by radiologist and me.  Chest x-ray findings: endotracheal tube in appropriate position  Patient  tolerated the procedure well with no immediate complications.    CRITICAL CARE Performed by: Derwood Kaplan   Total critical care time: 40 min  Critical care time was exclusive of separately billable procedures and treating other patients.  Critical care was necessary to treat or prevent imminent or life-threatening deterioration.  Critical care was time spent personally by me on the following activities: development of treatment plan with patient and/or surrogate as well as nursing, discussions with consultants, evaluation of patient's response to treatment, examination of patient, obtaining history from patient or surrogate, ordering and performing treatments and interventions, ordering and review of laboratory studies, ordering and review of radiographic studies, pulse oximetry and re-evaluation of patient's condition.   Pt comes in with seizures. In status. Pt given 10 mh iv versed per EMS - no response. We gave 2 mg ativan x 3 = 6 mg ativan - still seizing. 100 mg iv propofol given - still no response.  Decided to intubate. Propofol 100 mg for induction and rocuronium given.  Surprisingly, pt wide awake post intubation. Needed to be restrained, which has been needed in the past.  CCM and Neurology consulted.  Cause of seizure unknown. If there is seizure, and psychogenic component to it, is also possible. Lactate of just 4 and normal CK makes seizures for 30+ minutes almost unlikely, and so we are deferring the LP for now. WC is also normal. CCM informed about the subjective fevers last night.      Derwood Kaplan, MD 07/18/14 1610  Derwood Kaplan, MD 07/18/14 [phone]

## 2014-07-18 NOTE — Progress Notes (Signed)
Utilization Review Completed.Biana Haggar T10/21/2015  

## 2014-07-18 NOTE — Progress Notes (Signed)
Patient was extubated with no complications.Patient  was able to produce a strong cough as well as communicate with Rt and nurse. Patient was placed on 2LNC humidified and sats are 97%.
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
## 2014-07-18 NOTE — Progress Notes (Signed)
Patient following commands, cooperating with care and no longer requiring restraints. Order discontinued at 0516.  Corliss SkainsJuan Bernadett Milian RN

## 2014-07-18 NOTE — Progress Notes (Signed)
1530 - report recd from Enid SkeensSara Early, RN in 2MW. Will monitor for pt's arrival to unit   Andrew AuVafiadis, Kylee Nardozzi I 07/18/2014 3:57 PM

## 2014-07-18 NOTE — Procedures (Signed)
Extubation Procedure Note  Patient Details:   Name: Javier Hill DOB: [birth year] MRN: [id number]   Airway Documentation:  Airway 7.5 mm (Active)  Secured at (cm) 23 cm 07/18/2014  3:27 AM  Measured From Lips 07/18/2014  3:27 AM  Secured Location Right 07/18/2014  3:27 AM  Secured By [employer] Tube Holder 07/18/2014  3:27 AM  Tube Holder Repositioned Yes 07/18/2014  3:27 AM  Cuff Pressure (cm H2O) 26 cm H2O 07/17/2014  7:20 PM  Site Condition Dry 07/18/2014  3:27 AM    Evaluation  O2 sats: stable throughout Complications: No apparent complications Patient did tolerate procedure well. Bilateral Breath Sounds: Clear Suctioning: Airway Yes  Javier Hill, Javier Hill 07/18/2014, 5:16 AM
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
## 2014-07-18 NOTE — Progress Notes (Signed)
NEURO HOSPITALIST PROGRESS NOTE   SUBJECTIVE:                                                                          Extubated. Complains of shoulder and legs pain. No reported seizures this morning. On dilantin and keppra. He tells me that he is having seizures almost every day despite taking his anti-seizure medications religiously. He attributes this to significant stress.  OBJECTIVE:                                                                              Vital signs in last 24 hours: Temp:  [96.3 F (35.7 C)-99.9 F (37.7 C)] 98.5 F (36.9 C) (10/21 0836) Pulse Rate:  [60-107] 89 (10/21 0800) Resp:  [9-29] 18 (10/21 0800) BP: (95-160)/(60-119) 118/75 mmHg (10/21 0800) SpO2:  [97 %-100 %] 100 % (10/21 0600) FiO2 (%):  [40 %-100 %] 40 % (10/21 0457) Weight:  [58.514 kg (129 lb)-62.2 kg (137 lb 2 oz)] 62.2 kg (137 lb 2 oz) (10/21 0500)  Intake/Output from previous day: 10/20 0701 - 10/21 0700 In: 1622.9 [I.V.:1392.9; NG/GT:30; IV Piggyback:200] Out: 2700 [Urine:2700] Intake/Output this shift:   Nutritional status: General  Past Medical History  Diagnosis Date  . Hypertension   . Anxiety epi  . HIV (human immunodeficiency virus infection)   . Seizure    Physical exam: pleasant. Blood pressure 133/87, pulse 89, temperature 98.9 F (37.2 C), temperature source Temporal, resp. rate 17, height 5\' 4"  (1.626 m), weight 58.514 kg (129 lb), SpO2 97.00%.  Head: normocephalic.  Neck: supple, no bruits, no JVD.  Cardiac: no murmurs.  Lungs: clear.  Abdomen: soft, no tender, no mass.  Extremities: no edema.  Neurologic Exam:  General: Mental Status: Alert, oriented, thought content appropriate.  Speech fluent without evidence of aphasia.  Able to follow 3 step commands without difficulty. Cranial Nerves: II: Discs flat bilaterally; Visual fields grossly normal, pupils equal, round, reactive to light and accommodation III,IV, VI:  ptosis not present, extra-ocular motions intact bilaterally V,VII: smile symmetric, facial light touch sensation normal bilaterally VIII: hearing normal bilaterally IX,X: gag reflex present XI: bilateral shoulder shrug XII: midline tongue extension without atrophy or fasciculations  Motor: Moves all limbs symmetrically Tone and bulk:normal tone throughout; no atrophy noted Sensory: Pinprick and light touch intact throughout, bilaterally Deep Tendon Reflexes:  Brisk all over Plantars: Right: downgoing   Left: downgoing Cerebellar: normal finger-to-nose,  normal heel-to-shin test Gait:  Unable to test due to safety reasons CV: pulses palpable throughout     Lab Results: No results found for this basename: cbc, bmp, coags, chol, tri, ldl, hga1c   Lipid Panel No results found for this basename: CHOL, TRIG,  HDL, CHOLHDL, VLDL, LDLCALC,  in the last 72 hours  Studies/Results: Ct Head Wo Contrast  07/17/2014   CLINICAL DATA:  Seizures  EXAM: CT HEAD WITHOUT CONTRAST  TECHNIQUE: Contiguous axial images were obtained from the base of the skull through the vertex without intravenous contrast.  COMPARISON:  None.  FINDINGS: No skull fracture is noted. Mild mucosal thickening bilateral ethmoid air cells. The mastoid air cells are unremarkable.  No intracranial hemorrhage, mass effect or midline shift. No hydrocephalus. No acute cortical infarction. The gray and white-matter differentiation is preserved. No mass lesion is noted on this unenhanced scan.  IMPRESSION: No acute intracranial abnormality. Mucosal thickening bilateral ethmoid air cells.   Electronically Signed   By: Natasha Mead M.D.   On: 07/17/2014 16:54   Dg Chest Port 1 View  07/17/2014   CLINICAL DATA:  Status epilepticus.  Seizures.  Initial encounter.  EXAM: PORTABLE CHEST - 1 VIEW  COMPARISON:  07/14/2014.  FINDINGS: Support apparatus: Endotracheal tube tip is 9 mm from the carina. This should be retracted about 1 cm for better  positioning. Enteric tube is present in the stomach.  Cardiomediastinal Silhouette:  Within normal limits.  Lungs: Basilar atelectasis associated with markedly low lung volumes. This is greater on the LEFT than RIGHT. No pneumothorax.  Effusions:  None.  Other:  Monitoring leads project over the chest.  IMPRESSION: 1. Low position of the endotracheal tube tip, 9 mm from the carina. 2. Low volume chest with basilar atelectasis.   Electronically Signed   By: Andreas Newport M.D.   On: 07/17/2014 15:12   MEDICATIONS                                                                                                           Scheduled: . antiseptic oral rinse  7 mL Mouth Rinse QID  . chlorhexidine  15 mL Mouth Rinse BID  . clonazePAM  1 mg Per Tube BID  . heparin subcutaneous  5,000 Units Subcutaneous 3 times per day  . [START ON 07/19/2014] Influenza vac split quadrivalent PF  0.5 mL Intramuscular Tomorrow-1000  . levETIRAcetam  500 mg Intravenous Q12H  . phenytoin (DILANTIN) IV  100 mg Intravenous 3 times per day  . potassium chloride  20 mEq Per Tube Q4H    ASSESSMENT/PLAN:                                                                                              33 y/o with HIV and known seizures since age 4 years old, presents after sustaining repetitive and prolonged seizures with a semiology suggestive of non epileptic seizures. Back to baseline. Will continue current AED  but he will ultimately need admission to an epilepsy unit for video-eeg monitoring in order to better characterize his paroxysmal events. Should be able to go out of the unit today and potential discharge home today. Will sign off.  Wyatt Portelasvaldo Camilo, MD [facility] [phone]  07/18/2014, 9:03 AM

## 2014-07-19 DIAGNOSIS — Z21 Asymptomatic human immunodeficiency virus [HIV] infection status: Secondary | ICD-10-CM

## 2014-07-19 LAB — T-HELPER CELLS (CD4) COUNT (NOT AT ARMC)
CD4 T CELL HELPER: 31 % — AB (ref 33–55)
CD4 T Cell Abs: 340 /uL — ABNORMAL LOW (ref 400–2700)

## 2014-07-19 LAB — HIV-1 RNA QUANT-NO REFLEX-BLD
HIV 1 RNA Quant: <20 copies/mL (ref <20)
HIV-1 RNA Quant, Log: <1.3 {Log} (ref <1.30)

## 2014-07-19 MED ORDER — CLONAZEPAM 1 MG PO TABS
1.0000 mg | ORAL_TABLET | Freq: Two times a day (BID) | ORAL | Status: DC
  Administered 2014-07-19: 1 mg via ORAL
  Filled 2014-07-19: qty 1

## 2014-07-19 MED ORDER — EMTRICITABINE-TENOFOVIR DF 200-300 MG PO TABS: 1.0000 | ORAL_TABLET | Freq: Every day | ORAL | Status: DC

## 2014-07-19 MED ORDER — PHENYTOIN 50 MG PO CHEW
100.0000 mg | CHEWABLE_TABLET | Freq: Three times a day (TID) | ORAL | Status: DC
  Filled 2014-07-19 (×4): qty 2

## 2014-07-19 MED ORDER — PHENYTOIN SODIUM EXTENDED 300 MG PO CAPS: 300.0000 mg | ORAL_CAPSULE | Freq: Every day | ORAL | Status: DC

## 2014-07-19 MED ORDER — POTASSIUM CHLORIDE CRYS ER 20 MEQ PO TBCR
40.0000 meq | EXTENDED_RELEASE_TABLET | Freq: Once | ORAL | Status: AC
Start: 2014-07-19 — End: 2014-07-19
  Administered 2014-07-19: 40 meq via ORAL
  Filled 2014-07-19: qty 2

## 2014-07-19 MED ORDER — OMEPRAZOLE 40 MG PO CPDR: 40.0000 mg | DELAYED_RELEASE_CAPSULE | Freq: Every day | ORAL | Status: DC

## 2014-07-19 MED ORDER — IBUPROFEN 600 MG PO TABS
600.0000 mg | ORAL_TABLET | Freq: Three times a day (TID) | ORAL | Status: DC | PRN
  Administered 2014-07-19: 600 mg via ORAL
  Filled 2014-07-19: qty 1

## 2014-07-19 MED ORDER — LEVETIRACETAM 500 MG PO TABS: 500.0000 mg | ORAL_TABLET | Freq: Two times a day (BID) | ORAL | Status: DC

## 2014-07-19 MED ORDER — LEVETIRACETAM 500 MG PO TABS
500.0000 mg | ORAL_TABLET | Freq: Two times a day (BID) | ORAL | Status: DC
  Administered 2014-07-19: 500 mg via ORAL
  Filled 2014-07-19: qty 1

## 2014-07-19 MED ORDER — RALTEGRAVIR POTASSIUM 400 MG PO TABS
400.0000 mg | ORAL_TABLET | Freq: Two times a day (BID) | ORAL | Status: DC
Start: 2014-07-19 — End: 2014-09-03

## 2014-07-19 MED ORDER — PANTOPRAZOLE SODIUM 40 MG PO TBEC
40.0000 mg | DELAYED_RELEASE_TABLET | Freq: Every day | ORAL | Status: DC
  Administered 2014-07-19: 40 mg via ORAL
  Filled 2014-07-19: qty 1

## 2014-07-19 MED ORDER — CLONAZEPAM 1 MG PO TABS: 1.0000 mg | ORAL_TABLET | Freq: Two times a day (BID) | ORAL | Status: DC

## 2014-07-19 MED ORDER — IBUPROFEN 600 MG PO TABS: 600.0000 mg | ORAL_TABLET | Freq: Three times a day (TID) | ORAL | Status: DC | PRN

## 2014-07-19 NOTE — Progress Notes (Signed)
CASE MANAGEMENT CONSULT  Patient recently moved her from [place] with his significant other ( [place] lives) MD in [place] was Dr Dorthey Sawyerave; patient stated that he has not seen an MD in about 9 months and he plans to stay here in [place].  [facility]/ eligiability apt is [id number], 2015 at 11 am; Patient can get his prescriptions filled there at discharge all except HIV meds; Patient also stated that his significant other has providing financial assistance and can assist with his medication. [facility] to help assist with his HIV meds/ coordinate process; patient is unemployed, no insurance;Talked to Marcelino DusterMichelle at the ID clinic- apt made for Jul 31, 2014 at 1:30 pm; The patient stated that he has a few of his antivirals ( HIV meds) per Marcelino DusterMichelle it will take about 1 week to get his meds ( total cost possibly around $3,000). Patient has completed his [employer] paperwork to [place]; patient encouraged to keep his apt and how important it is to manage his health. Patient acknowledged understanding. Abelino DerrickB Saunders [place] RN,BSN,MHA [phone]

## 2014-07-19 NOTE — Discharge Summary (Signed)
Physician Discharge Summary  Patient ID: Javier KoyanagiJose Hill MRN: [id number] DOB/AGE: 1981/06/24 33 y.o.  Admit date: 07/17/2014 Discharge date: 07/19/2014  Primary Care Physician:  No PCP Per Patient  Discharge Diagnoses:    . Acute respiratory failure with hypoxia Status epilepticus  . HIV (human immunodeficiency virus infection)  Consults:  Neurology, Dr. Cyril Mourningamillo Patient was admitted by critical care service    Recommendations for Outpatient Follow-up:  Please note all the appointments for the patient, he will need assistance with medications, HIV meds, ID clinic appointment has also been made on 07/31/2014  Outpatient followup with neurology also scheduled, [facility] scheduled as below  Allergies:   Allergies  Allergen Reactions  . Aloprim [Allopurinol]   . Bactericin [Bacitracin]   . Bactrim [Sulfamethoxazole-Tmp Ds]   . Caffeine   . Haldol [Haloperidol Lactate]   . Penicillins   . Zoloft [Sertraline Hcl]   . Watermelon Flavor Rash     Discharge Medications:   Medication List    STOP taking these medications       ALPRAZolam 1 MG tablet  Commonly known as:  XANAX     lisinopril 20 MG tablet  Commonly known as:  PRINIVIL,ZESTRIL      TAKE these medications       clonazePAM 1 MG tablet  Commonly known as:  KLONOPIN  Take 1 tablet (1 mg total) by mouth 2 (two) times daily.     emtricitabine-tenofovir 200-300 MG per tablet  Commonly known as:  TRUVADA  Take 1 tablet by mouth daily.     ibuprofen 600 MG tablet  Commonly known as:  ADVIL,MOTRIN  Take 1 tablet (600 mg total) by mouth 3 (three) times daily as needed for headache.     levETIRAcetam 500 MG tablet  Commonly known as:  KEPPRA  Take 1 tablet (500 mg total) by mouth 2 (two) times daily.     omeprazole 40 MG capsule  Commonly known as:  PRILOSEC  Take 1 capsule (40 mg total) by mouth daily.     phenytoin 300 MG ER capsule  Commonly known as:   DILANTIN  Take 1 capsule (300 mg total) by mouth at bedtime.     raltegravir 400 MG tablet  Commonly known as:  ISENTRESS  Take 1 tablet (400 mg total) by mouth 2 (two) times daily.         Brief H and P: For complete details please refer to admission H and P, but in brief[age] year old male w/ known h/o HIV and seizure d/o. Reportedly not taking either his keppra or klonopin. Presented to ER possible break-thru seizure. Eventually required intubation after several doses of benzos and persistent seizure activity. PCCM asked to admit    Hospital Course:  Patient is a 33 year old male with known history of HIV and seizure disorder he had run out of his Keppra, Dilantin and Klonopin as [place]. Patient presented to the ER with breakthrough seizures. Patient eventually required intubation after several doses of benzodiazepines and persistent seizure activity. Patient was admitted to ICU by critical care service.  Acute respiratory failure for airway protection due to status epilepticus: Currently stable not on any O2, sats 100%  Seizure disorder presenting with breakthrough seizure and status epilepticus Patient was admitted to ICU and required intubation. He is currently stable. Patient was seen by neurology service, Dr. Cyril Mourningamillo, recommended to continue current AED's. He may ultimately need admission to an epilepsy unit  for VQ EEG monitoring in order to better characterize his paroxysmal events. Patient was given prescriptions for all his seizure medications and followup with Dr. Debarah CrapeYen was scheduled outpatient for close monitoring.  HIV Patient was given the prescription for his antiretrovirals. ID clinic appointment was scheduled on November 3 at 1:30 PM, will need assistance from infectious disease department for his HIV meds.    Day of Discharge BP 126/92  Pulse 93  Temp(Src) 98.8 F (37.1 C) (Oral)  Resp 20  Ht 5\' 4"  (1.626 m)  Wt 62.2 kg (137 lb 2  oz)  BMI 23.53 kg/m2  SpO2 100%  Physical Exam: General: Alert and awake oriented x3 not in any acute distress. CVS: S1-S2 clear no murmur rubs or gallops Chest: clear to auscultation bilaterally, no wheezing rales or rhonchi Abdomen: soft nontender, nondistended, normal bowel sounds Extremities: no cyanosis, clubbing or edema noted bilaterally Neuro: Cranial nerves II-XII intact, no focal neurological deficits   The results of significant diagnostics from this hospitalization (including imaging, microbiology, ancillary and laboratory) are listed below for reference.    LAB RESULTS: Basic Metabolic Panel:  Recent Labs Lab 07/17/14 1400 07/18/14 0210  NA 148* 147  K 4.6 3.5*  CL 108 110  CO2 28 26  GLUCOSE 128* 80  BUN 11 10  CREATININE 0.67 0.65  CALCIUM 9.5 8.4  MG  --  1.8  PHOS  --  2.7   Liver Function Tests:  Recent Labs Lab 07/17/14 1400  AST 26  ALT 48  ALKPHOS 101  BILITOT <0.2*  PROT 8.0  ALBUMIN 4.0   No results found for this basename: LIPASE, AMYLASE,  in the last 168 hours No results found for this basename: AMMONIA,  in the last 168 hours CBC:  Recent Labs Lab 07/17/14 1400 07/18/14 0210  WBC 7.0 6.6  NEUTROABS 4.1  --   HGB 14.4 11.6*  HCT 42.9 34.8*  MCV 94.1 94.6  PLT 222 171   Cardiac Enzymes:  Recent Labs Lab 07/17/14 1400  CKTOTAL 189  TROPONINI <0.30   BNP: No components found with this basename: POCBNP,  CBG:  Recent Labs Lab 07/17/14 1434 07/17/14 1730  GLUCAP 141* 99    Significant Diagnostic Studies:  Ct Head Wo Contrast  07/17/2014   CLINICAL DATA:  Seizures  EXAM: CT HEAD WITHOUT CONTRAST  TECHNIQUE: Contiguous axial images were obtained from the base of the skull through the vertex without intravenous contrast.  COMPARISON:  None.  FINDINGS: No skull fracture is noted. Mild mucosal thickening bilateral ethmoid air cells. The mastoid air cells are unremarkable.  No intracranial hemorrhage, mass effect or  midline shift. No hydrocephalus. No acute cortical infarction. The gray and white-matter differentiation is preserved. No mass lesion is noted on this unenhanced scan.  IMPRESSION: No acute intracranial abnormality. Mucosal thickening bilateral ethmoid air cells.   Electronically Signed   By: Natasha MeadLiviu  Pop M.D.   On: 07/17/2014 16:54   Dg Chest Port 1 View  07/17/2014   CLINICAL DATA:  Status epilepticus.  Seizures.  Initial encounter.  EXAM: PORTABLE CHEST - 1 VIEW  COMPARISON:  07/14/2014.  FINDINGS: Support apparatus: Endotracheal tube tip is 9 mm from the carina. This should be retracted about 1 cm for better positioning. Enteric tube is present in the stomach.  Cardiomediastinal Silhouette:  Within normal limits.  Lungs: Basilar atelectasis associated with markedly low lung volumes. This is greater on the LEFT than RIGHT. No pneumothorax.  Effusions:  None.  Other:  Monitoring leads project over the chest.  IMPRESSION: 1. Low position of the endotracheal tube tip, 9 mm from the carina. 2. Low volume chest with basilar atelectasis.   Electronically Signed   By: Andreas Newport M.D.   On: 07/17/2014 15:12       Disposition and Follow-up:    DISPOSITION: Home  DIET: Regular diet   TESTS THAT NEED FOLLOW-UP HIV test ordered for CD4 count and viral load  DISCHARGE FOLLOW-UP     Follow-up Information   Follow up with [facility]     On 07/23/2014. (at 9:00 AM, for hospital follow-up)    Contact information:   [address] [place] [address] [phone]      Follow up with Levert Feinstein, MD On 07/27/2014. (at 12:00PM, for hospital follow-up. Please arrive at 11:30AM for the paperwork.)    Specialty:  Neurology   Contact information:   [address] [place] [address] [phone]       Follow up with [facility]     On 08/08/2014. (Eligability apt for medication assistance/ referrals  Aug 08, 2014 at 11  am)    Contact information:   [address] [place] [address] [phone]      Follow up with [facility]              On 07/31/2014. (at 1:30 pm)    Contact information:   [address] [place] [address]       Time spent on Discharge: 40 minutes  Signed:   RAI,RIPUDEEP M.D. [facility] 07/19/2014, 12:04 PM Pager: [phone]

## 2014-07-19 NOTE — Progress Notes (Signed)
Pt discharged home with a friend. Reviewed all discharged instructions, emphasizing the importance of keeping all upcoming appointments, to assist with medications, as well as follow up for medical problems. Stressed importance of adhering to medication regimen to prevent future seizures. Pt. Verbalized understanding.

## 2014-07-20 LAB — HIV-1 RNA QUANT-NO REFLEX-BLD
HIV 1 RNA Quant: <20 copies/mL (ref <20)
HIV-1 RNA Quant, Log: <1.3 {Log} (ref <1.30)

## 2014-07-23 LAB — CULTURE, BLOOD (ROUTINE X 2)
CULTURE: NO GROWTH
Culture: NO GROWTH

## 2014-07-26 ENCOUNTER — Inpatient Hospital Stay: Payer: PRIVATE HEALTH INSURANCE | Admitting: Internal Medicine

## 2014-07-27 ENCOUNTER — Ambulatory Visit: Payer: Self-pay | Admitting: Neurology

## 2014-07-31 ENCOUNTER — Emergency Department (HOSPITAL_COMMUNITY)
Admission: EM | Admit: 2014-07-31 | Discharge: 2014-08-01 | Disposition: A | Discharge status: Home / Self Care | Payer: MEDICAID | Attending: Emergency Medicine | Admitting: Emergency Medicine

## 2014-07-31 ENCOUNTER — Encounter (HOSPITAL_COMMUNITY): Payer: Self-pay

## 2014-07-31 ENCOUNTER — Other Ambulatory Visit (HOSPITAL_COMMUNITY)
Admission: RE | Admit: 2014-07-31 | Discharge: 2014-07-31 | Disposition: A | Discharge status: Home / Self Care | Payer: PRIVATE HEALTH INSURANCE | Source: Ambulatory Visit | Attending: Infectious Diseases | Admitting: Infectious Diseases

## 2014-07-31 ENCOUNTER — Ambulatory Visit (INDEPENDENT_AMBULATORY_CARE_PROVIDER_SITE_OTHER): Payer: PRIVATE HEALTH INSURANCE

## 2014-07-31 ENCOUNTER — Telehealth: Payer: Self-pay

## 2014-07-31 DIAGNOSIS — I10 Essential (primary) hypertension: Secondary | ICD-10-CM | POA: Insufficient documentation

## 2014-07-31 DIAGNOSIS — Z79899 Other long term (current) drug therapy: Secondary | ICD-10-CM

## 2014-07-31 DIAGNOSIS — Z23 Encounter for immunization: Secondary | ICD-10-CM

## 2014-07-31 DIAGNOSIS — F064 Anxiety disorder due to known physiological condition: Secondary | ICD-10-CM

## 2014-07-31 DIAGNOSIS — Z720 Tobacco use: Secondary | ICD-10-CM | POA: Insufficient documentation

## 2014-07-31 DIAGNOSIS — R569 Unspecified convulsions: Secondary | ICD-10-CM

## 2014-07-31 DIAGNOSIS — R1310 Dysphagia, unspecified: Secondary | ICD-10-CM | POA: Insufficient documentation

## 2014-07-31 DIAGNOSIS — Z21 Asymptomatic human immunodeficiency virus [HIV] infection status: Secondary | ICD-10-CM | POA: Insufficient documentation

## 2014-07-31 DIAGNOSIS — Z113 Encounter for screening for infections with a predominantly sexual mode of transmission: Secondary | ICD-10-CM | POA: Insufficient documentation

## 2014-07-31 DIAGNOSIS — R21 Rash and other nonspecific skin eruption: Secondary | ICD-10-CM | POA: Insufficient documentation

## 2014-07-31 DIAGNOSIS — T50A95A Adverse effect of other bacterial vaccines, initial encounter: Secondary | ICD-10-CM | POA: Insufficient documentation

## 2014-07-31 DIAGNOSIS — Z880 Allergy status to penicillin: Secondary | ICD-10-CM | POA: Insufficient documentation

## 2014-07-31 DIAGNOSIS — T7840XA Allergy, unspecified, initial encounter: Secondary | ICD-10-CM

## 2014-07-31 DIAGNOSIS — Z791 Long term (current) use of non-steroidal anti-inflammatories (NSAID): Secondary | ICD-10-CM | POA: Insufficient documentation

## 2014-07-31 DIAGNOSIS — G40909 Epilepsy, unspecified, not intractable, without status epilepticus: Secondary | ICD-10-CM | POA: Insufficient documentation

## 2014-07-31 DIAGNOSIS — R110 Nausea: Secondary | ICD-10-CM | POA: Insufficient documentation

## 2014-07-31 DIAGNOSIS — F410 Panic disorder [episodic paroxysmal anxiety] without agoraphobia: Secondary | ICD-10-CM

## 2014-07-31 DIAGNOSIS — B20 Human immunodeficiency virus [HIV] disease: Secondary | ICD-10-CM

## 2014-07-31 DIAGNOSIS — L270 Generalized skin eruption due to drugs and medicaments taken internally: Secondary | ICD-10-CM | POA: Insufficient documentation

## 2014-07-31 DIAGNOSIS — F419 Anxiety disorder, unspecified: Secondary | ICD-10-CM | POA: Insufficient documentation

## 2014-07-31 LAB — COMPLETE METABOLIC PANEL WITH GFR
ALBUMIN: 4.8 g/dL (ref 3.5–5.2)
ALT: 59 U/L — ABNORMAL HIGH (ref 0–53)
AST: 29 U/L (ref 0–37)
Alkaline Phosphatase: 123 U/L — ABNORMAL HIGH (ref 39–117)
BUN: 10 mg/dL (ref 6–23)
CO2: 27 meq/L (ref 19–32)
Calcium: 9.6 mg/dL (ref 8.4–10.5)
Chloride: 103 mEq/L (ref 96–112)
Creat: 0.85 mg/dL (ref 0.50–1.35)
GLUCOSE: 78 mg/dL (ref 70–99)
POTASSIUM: 4.3 meq/L (ref 3.5–5.3)
SODIUM: 141 meq/L (ref 135–145)
TOTAL PROTEIN: 7.7 g/dL (ref 6.0–8.3)
Total Bilirubin: 0.2 mg/dL (ref 0.2–1.2)

## 2014-07-31 LAB — LIPID PANEL
CHOL/HDL RATIO: 3 ratio
Cholesterol: 154 mg/dL (ref 0–200)
HDL: 52 mg/dL (ref >39)
LDL Cholesterol: 81 mg/dL (ref 0–99)
TRIGLYCERIDES: 103 mg/dL (ref <150)
VLDL: 21 mg/dL (ref 0–40)

## 2014-07-31 MED ORDER — METHYLPREDNISOLONE SODIUM SUCC 125 MG IJ SOLR
125.0000 mg | Freq: Once | INTRAMUSCULAR | Status: AC
  Administered 2014-07-31: 125 mg via INTRAVENOUS
  Filled 2014-07-31: qty 2

## 2014-07-31 MED ORDER — LEVETIRACETAM 500 MG PO TABS: 500.0000 mg | ORAL_TABLET | Freq: Two times a day (BID) | ORAL | Status: DC

## 2014-07-31 MED ORDER — LISINOPRIL 20 MG PO TABS: 20.0000 mg | ORAL_TABLET | Freq: Every day | ORAL | Status: DC

## 2014-07-31 MED ORDER — PHENYTOIN SODIUM EXTENDED 100 MG PO CAPS: 100.0000 mg | ORAL_CAPSULE | Freq: Three times a day (TID) | ORAL | Status: DC

## 2014-07-31 MED ORDER — FAMOTIDINE IN NACL 20-0.9 MG/50ML-% IV SOLN
20.0000 mg | Freq: Once | INTRAVENOUS | Status: AC
  Administered 2014-07-31: 20 mg via INTRAVENOUS
  Filled 2014-07-31: qty 50

## 2014-07-31 MED ORDER — DIPHENHYDRAMINE HCL 50 MG/ML IJ SOLN
12.5000 mg | Freq: Once | INTRAMUSCULAR | Status: AC
  Administered 2014-07-31: 12.5 mg via INTRAVENOUS
  Filled 2014-07-31: qty 1

## 2014-07-31 MED ORDER — SODIUM CHLORIDE 0.9 % IV SOLN
Freq: Once | INTRAVENOUS | Status: AC
  Administered 2014-07-31: 23:00:00 via INTRAVENOUS

## 2014-07-31 NOTE — ED Notes (Signed)
Pt c/o itching and burning all over body after getting Pneumonia shot earlier today.  Pt st's he took 2 Benadryl tabs approx 3 hours ago without relief.  No redness, or rash present.  No resp problems noted.

## 2014-07-31 NOTE — ED Provider Notes (Signed)
CSN: [id number]     Arrival date & time 07/31/14  2214 History  This chart was scribed for non-physician practitioner, Elpidio AnisShari Bindu Docter, PA-C, working with Lyanne CoKevin M Campos, MD, by Bronson CurbJacqueline Melvin, ED Scribe. This patient was seen in room TR09C/TR09C and the patient's care was started at 10:55 PM.    Chief Complaint  Patient presents with  . Allergic Reaction    Patient is a 33 y.o. male presenting with allergic reaction. The history is provided by the patient. No language interpreter was used.  Allergic Reaction Presenting symptoms: difficulty swallowing, itching and rash   Presenting symptoms: no difficulty breathing   Severity:  Severe Prior allergic episodes:  Allergies to medications Context: medications (pneumonia injection)   Worsened by:  Nothing tried    HPI Comments: Javier Hill is a 33 y.o. male who presents to the Emergency Department complaining of an allergic reaction that began approximately 6 hours ago. Patient suspects he reaction is due to receiving an pneumonia injection that he received approximately 8 hours ago. There is associated diffuse itching and rash, throat tightness, and nausea. Patient has history of several allergies and notes similar symptoms upon receiving the flu vaccine. He denies abdominal pain. Patient is HIV positive with a CD4 count that went from 750 to 340 last check (October 20th, 2015).  Past Medical History  Diagnosis Date  . Hypertension   . Anxiety epi  . HIV (human immunodeficiency virus infection)   . Seizure    History reviewed. No pertinent past surgical history. Family History  Problem Relation Age of Onset  . Adopted: Yes   History  Substance Use Topics  . Smoking status: Current Every Day Smoker  . Smokeless tobacco: Not on file  . Alcohol Use: No    Review of Systems  Constitutional: Negative for fever.  HENT: Positive for trouble swallowing.   Gastrointestinal: Positive for nausea. Negative for vomiting and  abdominal pain.  Skin: Positive for itching and rash.      Allergies  Aloprim; Bactericin; Bactrim; Caffeine; Haldol; Penicillins; Zoloft; Acyclovir and related; Ambien; and Watermelon flavor  Home Medications   Prior to Admission medications   Medication Sig Start Date End Date Taking? Authorizing Provider  ALPRAZolam Prudy Feeler(XANAX) 1 MG tablet Take 1 mg by mouth.    Historical Provider, MD  clonazePAM (KLONOPIN) 0.5 MG tablet Take 1 mg by mouth. 07/30/14   Historical Provider, MD  emtricitabine-tenofovir (TRUVADA) 200-300 MG per tablet Take 1 tablet by mouth daily. 07/19/14   Ripudeep Jenna LuoK Rai, MD  ibuprofen (ADVIL,MOTRIN) 600 MG tablet Take 1 tablet (600 mg total) by mouth 3 (three) times daily as needed for headache. 07/19/14   Ripudeep Jenna LuoK Rai, MD  levETIRAcetam (KEPPRA) 500 MG tablet Take 1 tablet (500 mg total) by mouth 2 (two) times daily. 07/31/14   Cliffton AstersJohn Campbell, MD  lisinopril (PRINIVIL,ZESTRIL) 20 MG tablet Take 1 tablet (20 mg total) by mouth daily. 07/31/14   Cliffton AstersJohn Campbell, MD  omeprazole (PRILOSEC) 40 MG capsule Take 1 capsule (40 mg total) by mouth daily. 07/19/14   Ripudeep Jenna LuoK Rai, MD  phenytoin (DILANTIN) 100 MG ER capsule Take 1 capsule (100 mg total) by mouth 3 (three) times daily. 07/31/14   Cliffton AstersJohn Campbell, MD  phenytoin (DILANTIN) 300 MG ER capsule Take 1 capsule (300 mg total) by mouth at bedtime. 07/19/14   Ripudeep Jenna LuoK Rai, MD  raltegravir (ISENTRESS) 400 MG tablet Take 1 tablet (400 mg total) by mouth 2 (two) times daily. 07/19/14   Ripudeep  Jenna LuoK Rai, MD   Triage Vitals: BP 133/92 mmHg  Pulse 93  Temp(Src) 97.7 F (36.5 C) (Oral)  Resp 20  Ht 5\' 4"  (1.626 m)  Wt 131 lb (59.421 kg)  BMI 22.47 kg/m2  SpO2 100%  Physical Exam  Constitutional: He is oriented to person, place, and time. He appears well-developed and well-nourished. No distress.  HENT:  Head: Normocephalic and atraumatic.  Mouth/Throat: Oropharynx is clear and moist.  Oropharynx is benign.  Eyes: Conjunctivae and  EOM are normal.  Neck: No tracheal deviation present.  Cardiovascular: Normal rate.   Pulmonary/Chest: Effort normal and breath sounds normal. No stridor. No respiratory distress. He has no wheezes.  Lungs are clear.  Musculoskeletal: Normal range of motion.  Neurological: He is alert and oriented to person, place, and time.  Skin: Skin is warm and dry. Rash noted.  Generalized rash without hives. Mild excoriation.  Psychiatric: He has a normal mood and affect. His behavior is normal.  Nursing note and vitals reviewed.   ED Course  Procedures (including critical care time)  DIAGNOSTIC STUDIES: Oxygen Saturation is 100% on room air, normal by my interpretation.    COORDINATION OF CARE: At 2259 Discussed treatment plan with patient. Patient agrees.   Labs Review Labs Reviewed - No data to display  Imaging Review No results found.   EKG Interpretation None      MDM   Final diagnoses:  None    1. Allergic reaction  Mild allergic reaction to vaccination given earlier today. He is improving in the ED, less itching, no further throat tightness. Stable for discharge home.   I personally performed the services described in this documentation, which was scribed in my presence. The recorded information has been reviewed and is accurate.     Arnoldo HookerShari A Tryphena Perkovich, PA-C 08/01/14 [id number]

## 2014-07-31 NOTE — ED Notes (Signed)
Pt presents with itching starting at 2000 this pm. Pt received an pneumonia injection today at 1500. Pt had same injection in the past with similar symptoms but symptoms faded. Today pt's symptoms are persistent with itching and redness. Pt denies whelps, hives, SOB, or oral swelling.

## 2014-08-01 LAB — URINALYSIS
BILIRUBIN URINE: NEGATIVE
GLUCOSE, UA: NEGATIVE mg/dL
Hgb urine dipstick: NEGATIVE
Ketones, ur: NEGATIVE mg/dL
Leukocytes, UA: NEGATIVE
Nitrite: NEGATIVE
Protein, ur: NEGATIVE mg/dL
SPECIFIC GRAVITY, URINE: 1.006 (ref 1.005–1.030)
Urobilinogen, UA: 0.2 mg/dL (ref 0.0–1.0)
pH: 6.5 (ref 5.0–8.0)

## 2014-08-01 LAB — HEPATITIS C ANTIBODY: HCV Ab: NEGATIVE

## 2014-08-01 LAB — HEPATITIS B SURFACE ANTIBODY,QUALITATIVE: Hep B S Ab: NEGATIVE

## 2014-08-01 LAB — HEPATITIS B SURFACE ANTIGEN: HEP B S AG: NEGATIVE

## 2014-08-01 LAB — HEPATITIS A ANTIBODY, TOTAL: Hep A Total Ab: REACTIVE — AB

## 2014-08-01 LAB — HEPATITIS B CORE ANTIBODY, TOTAL: Hep B Core Total Ab: NONREACTIVE

## 2014-08-01 MED ORDER — DIPHENHYDRAMINE HCL 25 MG PO TABS
25.0000 mg | ORAL_TABLET | Freq: Four times a day (QID) | ORAL | Status: DC
Start: 2014-08-01 — End: 2014-12-04

## 2014-08-01 MED ORDER — FAMOTIDINE 20 MG PO TABS: 20.0000 mg | ORAL_TABLET | Freq: Two times a day (BID) | ORAL | Status: DC

## 2014-08-01 MED ORDER — PREDNISONE 20 MG PO TABS: 60.0000 mg | ORAL_TABLET | Freq: Every day | ORAL | Status: DC

## 2014-08-01 NOTE — Discharge Instructions (Signed)
FOLLOW UP WITH YOUR DOCTOR FOR RECHECK AND RETURN HERE WITH ANY WORSENING SYMPTOMS OR NEW CONCERN. TAKE MEDICATIONS AS PRESCRIBED.

## 2014-08-02 ENCOUNTER — Ambulatory Visit: Payer: PRIVATE HEALTH INSURANCE | Attending: Internal Medicine | Admitting: Internal Medicine

## 2014-08-02 ENCOUNTER — Encounter: Payer: Self-pay | Admitting: Internal Medicine

## 2014-08-02 VITALS — BP 130/82 | HR 83 | Temp 98.7°F | Resp 16 | Wt 130.0 lb

## 2014-08-02 DIAGNOSIS — F411 Generalized anxiety disorder: Secondary | ICD-10-CM | POA: Insufficient documentation

## 2014-08-02 DIAGNOSIS — B20 Human immunodeficiency virus [HIV] disease: Secondary | ICD-10-CM

## 2014-08-02 DIAGNOSIS — Z79899 Other long term (current) drug therapy: Secondary | ICD-10-CM | POA: Insufficient documentation

## 2014-08-02 DIAGNOSIS — F410 Panic disorder [episodic paroxysmal anxiety] without agoraphobia: Secondary | ICD-10-CM

## 2014-08-02 DIAGNOSIS — G40909 Epilepsy, unspecified, not intractable, without status epilepticus: Secondary | ICD-10-CM | POA: Insufficient documentation

## 2014-08-02 DIAGNOSIS — Z139 Encounter for screening, unspecified: Secondary | ICD-10-CM

## 2014-08-02 DIAGNOSIS — Z9181 History of falling: Secondary | ICD-10-CM | POA: Insufficient documentation

## 2014-08-02 DIAGNOSIS — M25511 Pain in right shoulder: Secondary | ICD-10-CM | POA: Insufficient documentation

## 2014-08-02 DIAGNOSIS — Z21 Asymptomatic human immunodeficiency virus [HIV] infection status: Secondary | ICD-10-CM

## 2014-08-02 DIAGNOSIS — I10 Essential (primary) hypertension: Secondary | ICD-10-CM | POA: Insufficient documentation

## 2014-08-02 DIAGNOSIS — F17210 Nicotine dependence, cigarettes, uncomplicated: Secondary | ICD-10-CM | POA: Insufficient documentation

## 2014-08-02 DIAGNOSIS — F064 Anxiety disorder due to known physiological condition: Secondary | ICD-10-CM | POA: Insufficient documentation

## 2014-08-02 DIAGNOSIS — R569 Unspecified convulsions: Secondary | ICD-10-CM

## 2014-08-02 DIAGNOSIS — K219 Gastro-esophageal reflux disease without esophagitis: Secondary | ICD-10-CM | POA: Insufficient documentation

## 2014-08-02 LAB — QUANTIFERON TB GOLD ASSAY (BLOOD)
INTERFERON GAMMA RELEASE ASSAY: NEGATIVE
Mitogen value: 8.95 IU/mL
QUANTIFERON NIL VALUE: 0.13 [IU]/mL
QUANTIFERON TB AG MINUS NIL: 0.07 [IU]/mL
TB Ag value: 0.2 IU/mL

## 2014-08-02 LAB — URINE CYTOLOGY ANCILLARY ONLY
CHLAMYDIA, DNA PROBE: NEGATIVE
Neisseria Gonorrhea: NEGATIVE

## 2014-08-02 MED ORDER — GABAPENTIN 300 MG PO CAPS: 300.0000 mg | ORAL_CAPSULE | Freq: Three times a day (TID) | ORAL | Status: DC

## 2014-08-02 NOTE — Progress Notes (Signed)
Patient here for follow up  Was seen in the ed recently for allergic reaction to the pneumonia vaccine

## 2014-08-02 NOTE — Progress Notes (Signed)
Patient recently moved from [place] , [place] with his partner who is present during intake.  He was recently hospitalized [facility] for seizure disorders. Prior to admission he was without his Keppra and Dilantin for for a month or more. He has been without his Isentress and Truvada for at least 2 months. He recalls his CD-4 being 750 in September 2015. His most recent CD-4 is 340.  Patient has  complaints of pain in his head and shoulder. He states he fell 4 days ago and injured his shoulder, memory issues since September, 2015 and constant headache in the center of his forehead.   Patient is very soft spoken and states he feels very depressed since [place] [facility] [place].  His partner was answering most of his questions and I suggested he let the patient speak  .  When his partner left the room I asked if he was safe or felt threatened in his current environment and he stated he was safe.   I will work with his Case Surveyor, miningManager Mitch, for assistance with payment for Dilantin, Keppra and Klonopin.  Patient will need to establish with primary care physician.

## 2014-08-02 NOTE — Progress Notes (Signed)
Patient Demographics  Javier Hill, is a 33 y.o. male  ZOX:[id number]  [id number]  DOB - Jul 26, 1981  CC:  Chief Complaint  Patient presents with  . Follow-up       HPI: Javier Hill is a 33 y.o. male here today to establish medical care. he has history of HIV, seizure disorder, recently hospitalized with breakthrough seizures since patient then out of his medications he was on Keppra Dilantin and Klonopin, patient was intubated and was in ICU, neurology was on board and recommended to continue his anti-seizure medications, patient also recently followed up with ID for HIV. Patient is also advised to follow with the neurology as per patient he missed his appointment, does report on and off seizures, patient not sure if it is secondary to anxiety but does report history of fall and complaining of right shoulder pain. Patient has No headache, No chest pain, No abdominal pain - No Nausea, No new weakness tingling or numbness, No Cough - SOB.  Allergies  Allergen Reactions  . Ciprofloxacin Shortness Of Breath  . Penicillins Anaphylaxis  . Sulfamethoxazole-Trimethoprim Anaphylaxis  . Aloprim [Allopurinol]   . Bactericin [Bacitracin]   . Bactrim [Sulfamethoxazole-Trimethoprim]   . Caffeine     Other reaction(s): Seizures  . Haldol [Haloperidol Lactate]     Other reaction(s): Unknown Muscle spasms  . Other     Other reaction(s): Seizures "Nabenzata" Pt is unsure of what type of antibiotic  . Prevnar [Pneumococcal 13-Val Conj Vacc]     Itching/rash  . Tramadol     Seizures   . Zoloft [Sertraline Hcl]   . Acyclovir And Related Rash and Hives  . Ambien [Zolpidem Tartrate] Anxiety and Hives    Insomnia   . Watermelon Flavor Rash   Past Medical History  Diagnosis Date  . Hypertension   . Anxiety epi  . HIV (human immunodeficiency virus infection)   . Seizure    Current Outpatient Prescriptions on File Prior to Visit  Medication Sig Dispense Refill    . ALPRAZolam (XANAX) 1 MG tablet Take 1 mg by mouth.    . clonazePAM (KLONOPIN) 0.5 MG tablet Take 1 mg by mouth.    . diphenhydrAMINE (BENADRYL) 25 MG tablet Take 1 tablet (25 mg total) by mouth every 6 (six) hours. 20 tablet 0  . emtricitabine-tenofovir (TRUVADA) 200-300 MG per tablet Take 1 tablet by mouth daily. 30 tablet 0  . famotidine (PEPCID) 20 MG tablet Take 1 tablet (20 mg total) by mouth 2 (two) times daily. 30 tablet 0  . ibuprofen (ADVIL,MOTRIN) 600 MG tablet Take 1 tablet (600 mg total) by mouth 3 (three) times daily as needed for headache. 90 tablet 1  . levETIRAcetam (KEPPRA) 500 MG tablet Take 1 tablet (500 mg total) by mouth 2 (two) times daily. 60 tablet 4  . lisinopril (PRINIVIL,ZESTRIL) 20 MG tablet Take 1 tablet (20 mg total) by mouth daily. 30 tablet 5  . omeprazole (PRILOSEC) 40 MG capsule Take 1 capsule (40 mg total) by mouth daily. 30 capsule 4  . phenytoin (DILANTIN) 100 MG ER capsule Take 1 capsule (100 mg total) by mouth 3 (three) times daily. 90 capsule 5  . phenytoin (DILANTIN) 300 MG ER capsule Take 1 capsule (300 mg total) by mouth at bedtime. 30 capsule 4  . predniSONE (DELTASONE) 20 MG tablet Take 3 tablets (60 mg total) by mouth daily. 9 tablet 0  . raltegravir (ISENTRESS) 400 MG tablet Take 1 tablet (400 mg total) by mouth  2 (two) times daily. 60 tablet 0   No current facility-administered medications on file prior to visit.   Family History  Problem Relation Age of Onset  . Adopted: Yes   History   Social History  . Marital Status: Single    Spouse Name: N/A    Number of Children: N/A  . Years of Education: N/A   Occupational History  . Not on file.   Social History Main Topics  . Smoking status: Current Every Day Smoker  . Smokeless tobacco: Not on file  . Alcohol Use: No  . Drug Use: No  . Sexual Activity: Not on file   Other Topics Concern  . Not on file   Social History Narrative    Review of Systems: Constitutional: Negative  for fever, chills, diaphoresis, activity change, appetite change and fatigue. HENT: Negative for ear pain, nosebleeds, congestion, facial swelling, rhinorrhea, neck pain, neck stiffness and ear discharge.  Eyes: Negative for pain, discharge, redness, itching and visual disturbance. Respiratory: Negative for cough, choking, chest tightness, shortness of breath, wheezing and stridor.  Cardiovascular: Negative for chest pain, palpitations and leg swelling. Gastrointestinal: Negative for abdominal distention. Genitourinary: Negative for dysuria, urgency, frequency, hematuria, flank pain, decreased urine volume, difficulty urinating and dyspareunia.  Musculoskeletal: Negative for back pain, joint swelling, arthralgia and gait problem. Neurological: Negative for dizziness, tremors, seizures, syncope, facial asymmetry, speech difficulty, weakness, light-headedness, numbness and headaches.  Hematological: Negative for adenopathy. Does not bruise/bleed easily. Psychiatric/Behavioral: Negative for hallucinations, behavioral problems, confusion, dysphoric mood, decreased concentration and agitation.    Objective:   Filed Vitals:   08/02/14 1054  BP: 130/82  Pulse: 83  Temp: 98.7 F (37.1 C)  Resp: 16    Physical Exam: Constitutional: Patient appears well-developed and well-nourished. No distress. HENT: Normocephalic, atraumatic, External right and left ear normal. Oropharynx is clear and moist.  Eyes: Conjunctivae and EOM are normal. PERRLA, no scleral icterus. Neck: Normal ROM. Neck supple. No JVD. No tracheal deviation. No thyromegaly. CVS: RRR, S1/S2 +, no murmurs, no gallops, no carotid bruit.  Pulmonary: Effort and breath sounds normal, no stridor, rhonchi, wheezes, rales.  Abdominal: Soft. BS +, no distension, tenderness, rebound or guarding.  Musculoskeletal: Normal range of motion. No edema and no tenderness. Right shoulder limited ROM Neuro: Alert. Normal reflexes, muscle tone  coordination. No cranial nerve deficit. Skin: Skin is warm and dry. No rash noted. Not diaphoretic. No erythema. No pallor. Psychiatric: Normal mood and affect. Behavior, judgment, thought content normal.  Lab Results  Component Value Date   WBC 6.6 07/18/2014   HGB 11.6* 07/18/2014   HCT 34.8* 07/18/2014   MCV 94.6 07/18/2014   PLT 171 07/18/2014   Lab Results  Component Value Date   CREATININE 0.85 07/31/2014   BUN 10 07/31/2014   NA 141 07/31/2014   K 4.3 07/31/2014   CL 103 07/31/2014   CO2 27 07/31/2014    No results found for: HGBA1C Lipid Panel     Component Value Date/Time   CHOL 154 07/31/2014 1718   TRIG 103 07/31/2014 1718   HDL 52 07/31/2014 1718   CHOLHDL 3.0 07/31/2014 1718   VLDL 21 07/31/2014 1718   LDLCALC 81 07/31/2014 1718       Assessment and plan:   1. Seizure Currently patient is on Keppra as well as Dilantin, I have advised patient to make a followup appointment with neurology.  2. HIV (human immunodeficiency virus infection)  currently on HIV medications following up with  infectious disease.  3. Essential hypertension Blood pressure is well controlled, currently on lisinopril 20 mg daily - COMPLETE METABOLIC PANEL WITH GFR  4. Gastroesophageal reflux disease, esophagitis presence not specified Advised for lifestyle modification continue with Prilosec.  5. Right shoulder pain  - gabapentin (NEURONTIN) 300 MG capsule; Take 1 capsule (300 mg total) by mouth 3 (three) times daily.  Dispense: 90 capsule; Refill: 3 - DG Shoulder Right; Future  6. Anxiety state Patient is taking Xanax and Klonopin. - Ambulatory referral to Psychiatry  7. Screening  - Vit D  25 hydroxy (rtn osteoporosis monitoring) - Hemoglobin A1c - TSH     Health Maintenance  -Vaccinations: uptodate with flu shot   Return in about 3 months (around 11/02/2014) for hypertension, seizure.    Doris CheadleADVANI, Viraaj Vorndran, MD

## 2014-08-04 LAB — HLA B*5701: HLA-B*5701 w/rflx HLA-B High: NEGATIVE

## 2014-08-08 ENCOUNTER — Ambulatory Visit: Payer: PRIVATE HEALTH INSURANCE

## 2014-08-08 ENCOUNTER — Encounter: Payer: Self-pay | Admitting: Neurology

## 2014-08-17 ENCOUNTER — Encounter: Payer: Self-pay | Admitting: Infectious Diseases

## 2014-08-17 ENCOUNTER — Ambulatory Visit (INDEPENDENT_AMBULATORY_CARE_PROVIDER_SITE_OTHER): Payer: PRIVATE HEALTH INSURANCE | Admitting: Infectious Diseases

## 2014-08-17 VITALS — BP 147/104 | HR 80 | Temp 98.4°F | Ht 64.0 in | Wt 129.0 lb

## 2014-08-17 DIAGNOSIS — R569 Unspecified convulsions: Secondary | ICD-10-CM

## 2014-08-17 DIAGNOSIS — Z113 Encounter for screening for infections with a predominantly sexual mode of transmission: Secondary | ICD-10-CM

## 2014-08-17 DIAGNOSIS — F410 Panic disorder [episodic paroxysmal anxiety] without agoraphobia: Secondary | ICD-10-CM

## 2014-08-17 DIAGNOSIS — B20 Human immunodeficiency virus [HIV] disease: Secondary | ICD-10-CM

## 2014-08-17 DIAGNOSIS — Z79899 Other long term (current) drug therapy: Secondary | ICD-10-CM

## 2014-08-17 DIAGNOSIS — F064 Anxiety disorder due to known physiological condition: Secondary | ICD-10-CM

## 2014-08-17 DIAGNOSIS — I10 Essential (primary) hypertension: Secondary | ICD-10-CM

## 2014-08-17 DIAGNOSIS — Z21 Asymptomatic human immunodeficiency virus [HIV] infection status: Secondary | ICD-10-CM

## 2014-08-17 NOTE — Assessment & Plan Note (Signed)
He is doing well. I spoke with him about changing to a FDC (all his meds in 1 pill, once daily). He is hesitant to do this. He is offered/refused condoms (he has at home). Will see him back in 3 months. He has gotten flu and pneumo vax.

## 2014-08-17 NOTE — Assessment & Plan Note (Signed)
Will try to get him in with neuro, and f/u with his PCP.

## 2014-08-17 NOTE — Assessment & Plan Note (Signed)
I advised him to f/u with [facility]. He asks for benzo today which I deffered.

## 2014-08-17 NOTE — Progress Notes (Signed)
   Subjective:    Patient ID: Javier KoyanagiJose Hill, male    DOB: 1981-01-02, 33 y.o.   MRN: [id number]  HPI [age] yo M with hx of HIV+ since 2013, currently on ISN/TRV (his only regiemen). [place]   He was admitted 10-21 to 10-22 with status epilepticus. He required intubation. He had been off his keppra and klonipin.  He also has a hx of HTN.  Today c/o of anxiety, trouble sleeping, feeling hot and cold.  States he had 2 seizure yesterday. Generalized. States he is taking keppra.  Has had headaches, was taking ibuprofen but has quit due to loss of appetite.  Has been taking his anti-htn as written.   HIV 1 RNA QUANT (copies/mL)  Date Value  07/19/2014 <20  07/18/2014 <20   CD4 T CELL ABS (/uL)  Date Value  07/18/2014 340*   Sochx reviewed.  FHx- pt does not know his parents.   Review of Systems  Constitutional: Positive for chills and appetite change. Negative for fever and unexpected weight change.  Gastrointestinal: Negative for diarrhea and constipation.  Genitourinary: Negative for difficulty urinating.  Neurological: Positive for seizures and headaches.       Objective:   Physical Exam  Constitutional: He appears well-developed and well-nourished.  HENT:  Mouth/Throat: No oropharyngeal exudate.  Eyes: EOM are normal. Pupils are equal, round, and reactive to light.  Neck: Neck supple.  Cardiovascular: Normal rate, regular rhythm and normal heart sounds.   Pulmonary/Chest: Effort normal and breath sounds normal.  Abdominal: Soft. Bowel sounds are normal. He exhibits no distension. There is no tenderness.  Lymphadenopathy:    He has no cervical adenopathy.  Neurological:  Strength and coordination normal. Some fasiculaitons of tongue.           Assessment & Plan:

## 2014-08-17 NOTE — Assessment & Plan Note (Signed)
He is slightly elevated with ACE-I today. He states he does not know who his doctor is. Will get him back in with his PCP.

## 2014-08-21 ENCOUNTER — Emergency Department (HOSPITAL_COMMUNITY): Payer: PRIVATE HEALTH INSURANCE

## 2014-08-21 ENCOUNTER — Emergency Department (HOSPITAL_COMMUNITY)
Admission: EM | Admit: 2014-08-21 | Discharge: 2014-08-21 | Disposition: A | Discharge status: Home / Self Care | Payer: PRIVATE HEALTH INSURANCE | Attending: Emergency Medicine | Admitting: Emergency Medicine

## 2014-08-21 ENCOUNTER — Encounter (HOSPITAL_COMMUNITY): Payer: Self-pay | Admitting: Emergency Medicine

## 2014-08-21 DIAGNOSIS — R197 Diarrhea, unspecified: Secondary | ICD-10-CM | POA: Insufficient documentation

## 2014-08-21 DIAGNOSIS — R1010 Upper abdominal pain, unspecified: Secondary | ICD-10-CM | POA: Insufficient documentation

## 2014-08-21 DIAGNOSIS — Z880 Allergy status to penicillin: Secondary | ICD-10-CM | POA: Insufficient documentation

## 2014-08-21 DIAGNOSIS — B20 Human immunodeficiency virus [HIV] disease: Secondary | ICD-10-CM | POA: Insufficient documentation

## 2014-08-21 DIAGNOSIS — F419 Anxiety disorder, unspecified: Secondary | ICD-10-CM | POA: Insufficient documentation

## 2014-08-21 DIAGNOSIS — R569 Unspecified convulsions: Secondary | ICD-10-CM

## 2014-08-21 DIAGNOSIS — I10 Essential (primary) hypertension: Secondary | ICD-10-CM | POA: Insufficient documentation

## 2014-08-21 DIAGNOSIS — Z79899 Other long term (current) drug therapy: Secondary | ICD-10-CM | POA: Insufficient documentation

## 2014-08-21 DIAGNOSIS — F410 Panic disorder [episodic paroxysmal anxiety] without agoraphobia: Secondary | ICD-10-CM

## 2014-08-21 DIAGNOSIS — R112 Nausea with vomiting, unspecified: Secondary | ICD-10-CM

## 2014-08-21 DIAGNOSIS — R05 Cough: Secondary | ICD-10-CM | POA: Insufficient documentation

## 2014-08-21 DIAGNOSIS — Z720 Tobacco use: Secondary | ICD-10-CM | POA: Insufficient documentation

## 2014-08-21 DIAGNOSIS — F064 Anxiety disorder due to known physiological condition: Secondary | ICD-10-CM

## 2014-08-21 HISTORY — DX: Epilepsy, unspecified, not intractable, without status epilepticus: G40.909

## 2014-08-21 LAB — CBC
HCT: 41.4 % (ref 39.0–52.0)
Hemoglobin: 14.3 g/dL (ref 13.0–17.0)
MCH: 32.6 pg (ref 26.0–34.0)
MCHC: 34.5 g/dL (ref 30.0–36.0)
MCV: 94.5 fL (ref 78.0–100.0)
PLATELETS: 222 10*3/uL (ref 150–400)
RBC: 4.38 MIL/uL (ref 4.22–5.81)
RDW: 14 % (ref 11.5–15.5)
WBC: 5.7 10*3/uL (ref 4.0–10.5)

## 2014-08-21 LAB — BASIC METABOLIC PANEL
Anion gap: 12 (ref 5–15)
BUN: 13 mg/dL (ref 6–23)
CO2: 25 mEq/L (ref 19–32)
Calcium: 9.1 mg/dL (ref 8.4–10.5)
Chloride: 106 mEq/L (ref 96–112)
Creatinine, Ser: 0.71 mg/dL (ref 0.50–1.35)
GFR calc Af Amer: >90 mL/min (ref >90)
GFR calc non Af Amer: >90 mL/min (ref >90)
Glucose, Bld: 92 mg/dL (ref 70–99)
Potassium: 3.9 mEq/L (ref 3.7–5.3)
Sodium: 143 mEq/L (ref 137–147)

## 2014-08-21 LAB — URINALYSIS, ROUTINE W REFLEX MICROSCOPIC
Bilirubin Urine: NEGATIVE
GLUCOSE, UA: NEGATIVE mg/dL
Hgb urine dipstick: NEGATIVE
Ketones, ur: NEGATIVE mg/dL
LEUKOCYTES UA: NEGATIVE
Nitrite: NEGATIVE
Protein, ur: NEGATIVE mg/dL
Specific Gravity, Urine: 1.023 (ref 1.005–1.030)
UROBILINOGEN UA: 0.2 mg/dL (ref 0.0–1.0)
pH: 7 (ref 5.0–8.0)

## 2014-08-21 LAB — CBG MONITORING, ED: GLUCOSE-CAPILLARY: 133 mg/dL — AB (ref 70–99)

## 2014-08-21 LAB — PHENYTOIN LEVEL, TOTAL: PHENYTOIN LVL: 3.2 ug/mL — AB (ref 10.0–20.0)

## 2014-08-21 MED ORDER — PHENYTOIN SODIUM EXTENDED 100 MG PO CAPS: 100.0000 mg | ORAL_CAPSULE | Freq: Three times a day (TID) | ORAL | Status: DC

## 2014-08-21 MED ORDER — LEVETIRACETAM 500 MG PO TABS: 500.0000 mg | ORAL_TABLET | Freq: Two times a day (BID) | ORAL | Status: DC

## 2014-08-21 MED ORDER — PROMETHAZINE HCL 25 MG RE SUPP: 25.0000 mg | Freq: Three times a day (TID) | RECTAL | Status: DC | PRN

## 2014-08-21 MED ORDER — CLONAZEPAM 0.5 MG PO TABS: 1.0000 mg | ORAL_TABLET | Freq: Every day | ORAL | Status: DC

## 2014-08-21 MED ORDER — IOHEXOL 300 MG/ML  SOLN
25.0000 mL | Freq: Once | INTRAMUSCULAR | Status: AC | PRN
  Administered 2014-08-21: 25 mL via ORAL

## 2014-08-21 MED ORDER — LEVETIRACETAM IN NACL 1000 MG/100ML IV SOLN
1000.0000 mg | Freq: Once | INTRAVENOUS | Status: AC
  Administered 2014-08-21: 1000 mg via INTRAVENOUS
  Filled 2014-08-21: qty 100

## 2014-08-21 MED ORDER — SODIUM CHLORIDE 0.9 % IV BOLUS (SEPSIS)
1000.0000 mL | Freq: Once | INTRAVENOUS | Status: AC
  Administered 2014-08-21: 1000 mL via INTRAVENOUS

## 2014-08-21 MED ORDER — IOHEXOL 300 MG/ML  SOLN
100.0000 mL | Freq: Once | INTRAMUSCULAR | Status: AC | PRN
  Administered 2014-08-21: 100 mL via INTRAVENOUS

## 2014-08-21 NOTE — ED Notes (Signed)
Called CT and advised pt done with oral contrast.

## 2014-08-21 NOTE — Discharge Instructions (Signed)
Please follow up with your primary care physician in 1-2 days. If you do not have one please call the [facility] and wellness [facility] number listed above. Please take medications as prescribed. Please read all discharge instructions and return precautions.   Viral Gastroenteritis Viral gastroenteritis is also known as stomach flu. This condition affects the stomach and intestinal tract. It can cause sudden diarrhea and vomiting. The illness typically lasts 3 to 8 days. Most people develop an immune response that eventually gets rid of the virus. While this natural response develops, the virus can make you quite ill. CAUSES  Many different viruses can cause gastroenteritis, such as rotavirus or noroviruses. You can catch one of these viruses by consuming contaminated food or water. You may also catch a virus by sharing utensils or other personal items with an infected person or by touching a contaminated surface. SYMPTOMS  The most common symptoms are diarrhea and vomiting. These problems can cause a severe loss of body fluids (dehydration) and a body salt (electrolyte) imbalance. Other symptoms may include:  Fever.  Headache.  Fatigue.  Abdominal pain. DIAGNOSIS  Your caregiver can usually diagnose viral gastroenteritis based on your symptoms and a physical exam. A stool sample may also be taken to test for the presence of viruses or other infections. TREATMENT  This illness typically goes away on its own. Treatments are aimed at rehydration. The most serious cases of viral gastroenteritis involve vomiting so severely that you are not able to keep fluids down. In these cases, fluids must be given through an intravenous line (IV). HOME CARE INSTRUCTIONS   Drink enough fluids to keep your urine clear or pale yellow. Drink small amounts of fluids frequently and increase the amounts as tolerated.  Ask your caregiver for specific rehydration instructions.  Avoid:  Foods high in  sugar.  Alcohol.  Carbonated drinks.  Tobacco.  Juice.  Caffeine drinks.  Extremely hot or cold fluids.  Fatty, greasy foods.  Too much intake of anything at one time.  Dairy products until 24 to 48 hours after diarrhea stops.  You may consume probiotics. Probiotics are active cultures of beneficial bacteria. They may lessen the amount and number of diarrheal stools in adults. Probiotics can be found in yogurt with active cultures and in supplements.  Wash your hands well to avoid spreading the virus.  Only take over-the-counter or prescription medicines for pain, discomfort, or fever as directed by your caregiver. Do not give aspirin to children. Antidiarrheal medicines are not recommended.  Ask your caregiver if you should continue to take your regular prescribed and over-the-counter medicines.  Keep all follow-up appointments as directed by your caregiver. SEEK IMMEDIATE MEDICAL CARE IF:   You are unable to keep fluids down.  You do not urinate at least once every 6 to 8 hours.  You develop shortness of breath.  You notice blood in your stool or vomit. This may look like coffee grounds.  You have abdominal pain that increases or is concentrated in one small area (localized).  You have persistent vomiting or diarrhea.  You have a fever.  The patient is a child younger than 3 months, and he or she has a fever.  The patient is a child older than 3 months, and he or she has a fever and persistent symptoms.  The patient is a child older than 3 months, and he or she has a fever and symptoms suddenly get worse.  The patient is a baby, and he or  she has no tears when crying. MAKE SURE YOU:   Understand these instructions.  Will watch your condition.  Will get help right away if you are not doing well or get worse. Document Released: 09/14/2005 Document Revised: 12/07/2011 Document Reviewed: 07/01/2011 [facility] Patient Information 2015 [place], [place]. This  information is not intended to replace advice given to you by your health care provider. Make sure you discuss any questions you have with your health care provider.  Epilepsy Epilepsy is a disorder in which a person has repeated seizures over time. A seizure is a release of abnormal electrical activity in the brain. Seizures can cause a change in attention, behavior, or the ability to remain awake and alert (altered mental status). Seizures often involve uncontrollable shaking (convulsions).  Most people with epilepsy lead normal lives. However, people with epilepsy are at an increased risk of falls, accidents, and injuries. Therefore, it is important to begin treatment right away. CAUSES  Epilepsy has many possible causes. Anything that disturbs the normal pattern of brain cell activity can lead to seizures. This may include:   Head injury.  Birth trauma.  High fever as a child.  Stroke.  Bleeding into or around the brain.  Certain drugs.  Prolonged low oxygen, such as what occurs after CPR efforts.  Abnormal brain development.  Certain illnesses, such as meningitis, encephalitis (brain infection), malaria, and other infections.  An imbalance of nerve signaling chemicals (neurotransmitters).  SIGNS AND SYMPTOMS  The symptoms of a seizure can vary greatly from one person to another. Right before a seizure, you may have a warning (aura) that a seizure is about to occur. An aura may include the following symptoms:  Fear or anxiety.  Nausea.  Feeling like the room is spinning (vertigo).  Vision changes, such as seeing flashing lights or spots. Common symptoms during a seizure include:  Abnormal sensations, such as an abnormal smell or a bitter taste in the mouth.   Sudden, general body stiffness.   Convulsions that involve rhythmic jerking of the face, arm, or leg on one or both sides.   Sudden change in consciousness.   Appearing to be awake but not responding.    Appearing to be asleep but cannot be awakened.   Grimacing, chewing, lip smacking, drooling, tongue biting, or loss of bowel or bladder control. After a seizure, you may feel sleepy for a while. DIAGNOSIS  Your health care provider will ask about your symptoms and take a medical history. Descriptions from any witnesses to your seizures will be very helpful in the diagnosis. A physical exam, including a detailed neurological exam, is necessary. Various tests may be done, such as:   An electroencephalogram (EEG). This is a painless test of your brain waves. In this test, a diagram is created of your brain waves. These diagrams can be interpreted by a specialist.  An MRI of the brain.   A CT scan of the brain.   A spinal tap (lumbar puncture, LP).  Blood tests to check for signs of infection or abnormal blood chemistry. TREATMENT  There is no cure for epilepsy, but it is generally treatable. Once epilepsy is diagnosed, it is important to begin treatment as soon as possible. For most people with epilepsy, seizures can be controlled with medicines. The following may also be used:  A pacemaker for the brain (vagus nerve stimulator) can be used for people with seizures that are not well controlled by medicine.  Surgery on the brain. For some people,  epilepsy eventually goes away. HOME CARE INSTRUCTIONS   Follow your health care provider's recommendations on driving and safety in normal activities.  Get enough rest. Lack of sleep can cause seizures.  Only take over-the-counter or prescription medicines as directed by your health care provider. Take any prescribed medicine exactly as directed.  Avoid any known triggers of your seizures.  Keep a seizure diary. Record what you recall about any seizure, especially any possible trigger.   Make sure the people you live and work with know that you are prone to seizures. They should receive instructions on how to help you. In general, a  witness to a seizure should:   Cushion your head and body.   Turn you on your side.   Avoid unnecessarily restraining you.   Not place anything inside your mouth.   Call for emergency medical help if there is any question about what has occurred.   Follow up with your health care provider as directed. You may need regular blood tests to monitor the levels of your medicine.  SEEK MEDICAL CARE IF:   You develop signs of infection or other illness. This might increase the risk of a seizure.   You seem to be having more frequent seizures.   Your seizure pattern is changing.  SEEK IMMEDIATE MEDICAL CARE IF:   You have a seizure that does not stop after a few moments.   You have a seizure that causes any difficulty in breathing.   You have a seizure that results in a very severe headache.   You have a seizure that leaves you with the inability to speak or use a part of your body.  Document Released: 09/14/2005 Document Revised: 07/05/2013 Document Reviewed: 04/26/2013 [facility] Patient Information 2015 [place], [place]. This information is not intended to replace advice given to you by your health care provider. Make sure you discuss any questions you have with your health care provider.

## 2014-08-21 NOTE — ED Provider Notes (Signed)
CSN: [id number]     Arrival date & time 08/21/14  1414 History   First MD Initiated Contact with Patient 08/21/14 1636     Chief Complaint  Patient presents with  . Seizures     (Consider location/radiation/quality/duration/timing/severity/associated sxs/prior Treatment) HPI Comments: Patient is a 33 yo M PMHx significant for HTN, Anxiety, HIV, Seizures presenting to the ED for three seizures this morning, last one lasted approximately 15 minutes. The seizures were witnessed, described as full body involvement, without incontinence. Patient states he is out of his Klonopin regularly taking his Keppra and Dilantin. He states he has been taking his Keppra and Dilantin as prescribed. Also complaining that he has had a week of subjective fever, chills, nausea, nonbloody nonbilious vomiting, generalized abdominal pain, nonbloody diarrhea. No recent antibiotic use. He is also complaining of a nonproductive cough. Patient is HIV positive with a CD4 count that went from 750 to 340 last check (October 20th, 2015).  Patient is a 33 y.o. male presenting with seizures.  Seizures   Past Medical History  Diagnosis Date  . Hypertension   . Anxiety epi  . HIV (human immunodeficiency virus infection)   . Seizure   . Epilepsy    Past Surgical History  Procedure Laterality Date  . Orif clavicular fracture     Family History  Problem Relation Age of Onset  . Adopted: Yes   History  Substance Use Topics  . Smoking status: Current Every Day Smoker -- 0.10 packs/day    Types: Cigarettes  . Smokeless tobacco: Never Used  . Alcohol Use: No    Review of Systems  Constitutional: Positive for fever and chills.  Respiratory: Positive for cough.   Gastrointestinal: Positive for nausea, vomiting, abdominal pain and diarrhea.  Neurological: Positive for seizures.  All other systems reviewed and are negative.     Allergies  Caffeine; Ciprofloxacin; Penicillins; Sulfamethoxazole-trimethoprim;  Tramadol; Acyclovir and related; Haldol; Aloprim; Ambien; Bactericin; Bactrim; Other; Pneumovax; Prevnar; Watermelon flavor; and Zoloft  Home Medications   Prior to Admission medications   Medication Sig Start Date End Date Taking? Authorizing Provider  emtricitabine-tenofovir (TRUVADA) 200-300 MG per tablet Take 1 tablet by mouth daily. 07/19/14  Yes Ripudeep Jenna Luo, MD  lisinopril (PRINIVIL,ZESTRIL) 20 MG tablet Take 1 tablet (20 mg total) by mouth daily. 07/31/14  Yes Cliffton Asters, MD  omeprazole (PRILOSEC OTC) 20 MG tablet Take 20 mg by mouth daily.   Yes Historical Provider, MD  raltegravir (ISENTRESS) 400 MG tablet Take 1 tablet (400 mg total) by mouth 2 (two) times daily. 07/19/14  Yes Ripudeep Jenna Luo, MD  ALPRAZolam Prudy Feeler) 1 MG tablet Take 1 mg by mouth.    Historical Provider, MD  clonazePAM (KLONOPIN) 0.5 MG tablet Take 2 tablets (1 mg total) by mouth daily. 08/21/14   Joenathan Sakuma L Davon Folta, PA-C  cloNIDine-chlorthalidone (CLORPRES) 0.1-15 MG per tablet Take 1 tablet by mouth.    Historical Provider, MD  diphenhydrAMINE (BENADRYL) 25 MG tablet Take 1 tablet (25 mg total) by mouth every 6 (six) hours. Patient not taking: Reported on 08/21/2014 08/01/14   Melvenia Beam A Upstill, PA-C  gabapentin (NEURONTIN) 300 MG capsule Take 1 capsule (300 mg total) by mouth 3 (three) times daily. 08/02/14   Doris Cheadle, MD  ibuprofen (ADVIL,MOTRIN) 600 MG tablet Take 1 tablet (600 mg total) by mouth 3 (three) times daily as needed for headache. 07/19/14   Ripudeep Jenna Luo, MD  levETIRAcetam (KEPPRA) 500 MG tablet Take 1 tablet (500 mg total) by  mouth 2 (two) times daily. 08/21/14   Annika Selke L Layce Sprung, PA-C  omeprazole (PRILOSEC) 40 MG capsule Take 1 capsule (40 mg total) by mouth daily. 07/19/14   Ripudeep Jenna LuoK Rai, MD  phenytoin (DILANTIN) 100 MG ER capsule Take 1 capsule (100 mg total) by mouth 3 (three) times daily. 08/21/14   Cerenity Goshorn L Dayla Gasca, PA-C  promethazine (PHENERGAN) 25 MG suppository Place 1  suppository (25 mg total) rectally every 8 (eight) hours as needed for nausea or vomiting. 08/21/14   Surie Suchocki L Moustapha Tooker, PA-C   BP 123/72 mmHg  Pulse 60  Temp(Src) 98 F (36.7 C) (Oral)  Resp 15  Ht 5\' 4"  (1.626 m)  Wt 129 lb (58.514 kg)  BMI 22.13 kg/m2  SpO2 100% Physical Exam  Constitutional: He is oriented to person, place, and time. He appears well-developed and well-nourished. No distress.  HENT:  Head: Normocephalic and atraumatic.  Right Ear: External ear normal.  Left Ear: External ear normal.  Nose: Nose normal.  Mouth/Throat: Oropharynx is clear and moist. No oropharyngeal exudate.  Eyes: Conjunctivae and EOM are normal. Pupils are equal, round, and reactive to light.  Neck: Normal range of motion. Neck supple.  Cardiovascular: Normal rate, regular rhythm, normal heart sounds and intact distal pulses.   Pulmonary/Chest: Effort normal and breath sounds normal. No respiratory distress.  Abdominal: Soft. Bowel sounds are normal. He exhibits no distension. There is generalized tenderness. There is no rigidity, no rebound and no guarding.  Musculoskeletal: Normal range of motion. He exhibits no edema.  Neurological: He is alert and oriented to person, place, and time. He has normal strength. No cranial nerve deficit. Gait normal. GCS eye subscore is 4. GCS verbal subscore is 5. GCS motor subscore is 6.  Sensation grossly intact.  No pronator drift.  Bilateral heel-knee-shin intact.  Skin: Skin is warm and dry. He is not diaphoretic.  Nursing note and vitals reviewed.   ED Course  Procedures (including critical care time) Medications  levETIRAcetam (KEPPRA) IVPB 1000 mg/100 mL premix (1,000 mg Intravenous Given 08/21/14 1720)  sodium chloride 0.9 % bolus 1,000 mL (0 mLs Intravenous Stopped 08/21/14 2021)  iohexol (OMNIPAQUE) 300 MG/ML solution 25 mL (25 mLs Oral Contrast Given 08/21/14 1839)  iohexol (OMNIPAQUE) 300 MG/ML solution 100 mL (100 mLs Intravenous Contrast  Given 08/21/14 1903)    Labs Review Labs Reviewed  PHENYTOIN LEVEL, TOTAL - Abnormal; Notable for the following:    Phenytoin Lvl 3.2 (*)    All other components within normal limits  CBG MONITORING, ED - Abnormal; Notable for the following:    Glucose-Capillary 133 (*)    All other components within normal limits  URINE CULTURE  CBC  BASIC METABOLIC PANEL  URINALYSIS, ROUTINE W REFLEX MICROSCOPIC    Imaging Review Dg Chest 2 View  08/21/2014   CLINICAL DATA:  Seizure today, posterior pain bilaterally for 2 weeks, shortness of breath  EXAM: CHEST  2 VIEW  COMPARISON:  07/17/2014  FINDINGS: Cardiomediastinal silhouette is stable. No acute infiltrate or pleural effusion. No pulmonary edema. Bony thorax is unremarkable. No pneumothorax.  IMPRESSION: No active cardiopulmonary disease.   Electronically Signed   By: Natasha MeadLiviu  Pop M.D.   On: 08/21/2014 18:17   Ct Head Wo Contrast  08/21/2014   CLINICAL DATA:  Seizure activity today, initial encounter  EXAM: CT HEAD WITHOUT CONTRAST  TECHNIQUE: Contiguous axial images were obtained from the base of the skull through the vertex without intravenous contrast.  COMPARISON:  07/17/2014  FINDINGS:  The bony calvarium is intact. No findings to suggest acute hemorrhage, acute infarction or space-occupying mass lesion are noted.  IMPRESSION: No acute intracranial abnormality.   Electronically Signed   By: Alcide CleverMark  Lukens M.D.   On: 08/21/2014 19:19   Ct Abdomen Pelvis W Contrast  08/21/2014   CLINICAL DATA:  Left-sided abdominal pain and nausea and vomiting for 1 week  EXAM: CT ABDOMEN AND PELVIS WITH CONTRAST  TECHNIQUE: Multidetector CT imaging of the abdomen and pelvis was performed using the standard protocol following bolus administration of intravenous contrast.  CONTRAST:  100mL OMNIPAQUE IOHEXOL 300 MG/ML  SOLN  COMPARISON:  None.  FINDINGS: Lung bases are free of acute infiltrate or sizable effusion. The liver is mildly fatty infiltrated. The  gallbladder is partially decompressed. The spleen, adrenal glands and pancreas are normal in their CT appearance. Kidneys demonstrate a normal enhancement pattern bilaterally. No renal calculi or obstructive changes are seen. The appendix is air-filled and within normal limits. The bladder is well distended. No pelvic mass lesion is seen. No acute bony abnormality is noted. A somewhat ovoid calcification is is noted in the left hemipelvis likely representing contrast within a small diverticulum. No inflammatory changes are seen.  IMPRESSION: No acute abnormality seen.   Electronically Signed   By: Alcide CleverMark  Lukens M.D.   On: 08/21/2014 19:22     EKG Interpretation None      MDM   Final diagnoses:  Seizure  Nausea vomiting and diarrhea    Filed Vitals:   08/21/14 2015  BP: 123/72  Pulse: 60  Temp:   Resp: 15   Afebrile, NAD, non-toxic appearing, AAOx4. I have reviewed nursing notes, vital signs, and all appropriate lab and imaging results for this patient.  1) Seizures: No neurofocal deficits on examination. No further seizure activity in the ED. Loading dose of Keppra given. At this time no obvious trauma or severe infection noted for possible increased seizure activity, likely due to non-compliance. Provided 2 week refill on anti epileptics.   2) Nausea vomiting, diarrhea: Patient with symptoms consistent with viral gastroenteritis.  Vitals are stable, no fever.  No signs of dehydration, tolerating PO fluids > 6 oz.  Lungs are clear.  No concern for appendicitis, cholecystitis, pancreatitis, ruptured viscus, UTI, kidney stone, or any other abdominal etiology.  Supportive therapy indicated with return if symptoms worsen.  Patient counseled.  Return precautions discussed. Patient is agreeable to plan. Patient is stable at time of discharge. Patient d/w with Dr. Gwendolyn GrantWalden, agrees with plan.         Jeannetta EllisJennifer L Geovana Gebel, PA-C 08/22/14 [id number]  Elwin MochaBlair Walden, MD 08/23/14 [phone]

## 2014-08-21 NOTE — ED Notes (Signed)
Pt a/o x 4 with steady gait on d/c. 

## 2014-08-21 NOTE — ED Notes (Addendum)
Pt states that he has been vomiting twice today as well. Pt reports being out of klonopin and almost out of kepra and dilantin. Pt states that he has been taking his dilantin as prescribed

## 2014-08-21 NOTE — ED Notes (Signed)
Patient states had 3 seizure this morning with the last seizure at about 1000.   Patient states that the seizure was approximately 15 minutes in duration.   Patient states does not remember.   Patient's friend states that he was "full body involvement".   Patient denies incontinence or other problems in addition to the seizures.  Patient states called Dr. Ricki RodriguezAdvani's office this morning because patient is out of his epilepsy medication.   Patient was told by Dr. Orpah CobbAdvani that [facility] because he doesn't prescribe Klonopin (per patient report).   Patient states that he doesn't have insurance to pay for the medication or the visit.

## 2014-08-22 LAB — URINE CULTURE: Colony Count: 2000

## 2014-09-03 ENCOUNTER — Other Ambulatory Visit: Payer: Self-pay | Admitting: *Deleted

## 2014-09-03 DIAGNOSIS — B20 Human immunodeficiency virus [HIV] disease: Secondary | ICD-10-CM

## 2014-09-03 MED ORDER — RALTEGRAVIR POTASSIUM 400 MG PO TABS: 400.0000 mg | ORAL_TABLET | Freq: Two times a day (BID) | ORAL | Status: DC

## 2014-09-03 MED ORDER — EMTRICITABINE-TENOFOVIR DF 200-300 MG PO TABS: 1.0000 | ORAL_TABLET | Freq: Every day | ORAL | Status: DC

## 2014-09-06 ENCOUNTER — Emergency Department (HOSPITAL_BASED_OUTPATIENT_CLINIC_OR_DEPARTMENT_OTHER)
Admission: EM | Admit: 2014-09-06 | Discharge: 2014-09-06 | Disposition: A | Discharge status: Home / Self Care | Payer: PRIVATE HEALTH INSURANCE | Attending: Emergency Medicine | Admitting: Emergency Medicine

## 2014-09-06 ENCOUNTER — Emergency Department (HOSPITAL_BASED_OUTPATIENT_CLINIC_OR_DEPARTMENT_OTHER): Payer: PRIVATE HEALTH INSURANCE

## 2014-09-06 ENCOUNTER — Encounter (HOSPITAL_BASED_OUTPATIENT_CLINIC_OR_DEPARTMENT_OTHER): Payer: Self-pay | Admitting: *Deleted

## 2014-09-06 DIAGNOSIS — R52 Pain, unspecified: Secondary | ICD-10-CM

## 2014-09-06 DIAGNOSIS — Z880 Allergy status to penicillin: Secondary | ICD-10-CM | POA: Insufficient documentation

## 2014-09-06 DIAGNOSIS — G40909 Epilepsy, unspecified, not intractable, without status epilepticus: Secondary | ICD-10-CM | POA: Insufficient documentation

## 2014-09-06 DIAGNOSIS — I10 Essential (primary) hypertension: Secondary | ICD-10-CM | POA: Insufficient documentation

## 2014-09-06 DIAGNOSIS — R1032 Left lower quadrant pain: Secondary | ICD-10-CM

## 2014-09-06 DIAGNOSIS — Z720 Tobacco use: Secondary | ICD-10-CM | POA: Insufficient documentation

## 2014-09-06 DIAGNOSIS — F419 Anxiety disorder, unspecified: Secondary | ICD-10-CM | POA: Insufficient documentation

## 2014-09-06 DIAGNOSIS — Z21 Asymptomatic human immunodeficiency virus [HIV] infection status: Secondary | ICD-10-CM | POA: Insufficient documentation

## 2014-09-06 DIAGNOSIS — R197 Diarrhea, unspecified: Secondary | ICD-10-CM | POA: Insufficient documentation

## 2014-09-06 DIAGNOSIS — K5900 Constipation, unspecified: Secondary | ICD-10-CM | POA: Insufficient documentation

## 2014-09-06 DIAGNOSIS — R1012 Left upper quadrant pain: Secondary | ICD-10-CM | POA: Insufficient documentation

## 2014-09-06 DIAGNOSIS — R079 Chest pain, unspecified: Secondary | ICD-10-CM | POA: Insufficient documentation

## 2014-09-06 DIAGNOSIS — Z79899 Other long term (current) drug therapy: Secondary | ICD-10-CM | POA: Insufficient documentation

## 2014-09-06 LAB — CBC WITH DIFFERENTIAL/PLATELET
Basophils Absolute: 0 10*3/uL (ref 0.0–0.1)
Basophils Relative: 0 % (ref 0–1)
Eosinophils Absolute: 0.1 10*3/uL (ref 0.0–0.7)
Eosinophils Relative: 1 % (ref 0–5)
HCT: 41.6 % (ref 39.0–52.0)
HEMOGLOBIN: 14.2 g/dL (ref 13.0–17.0)
LYMPHS PCT: 42 % (ref 12–46)
Lymphs Abs: 2.5 10*3/uL (ref 0.7–4.0)
MCH: 32.5 pg (ref 26.0–34.0)
MCHC: 34.1 g/dL (ref 30.0–36.0)
MCV: 95.2 fL (ref 78.0–100.0)
MONOS PCT: 8 % (ref 3–12)
Monocytes Absolute: 0.5 10*3/uL (ref 0.1–1.0)
NEUTROS PCT: 49 % (ref 43–77)
Neutro Abs: 2.8 10*3/uL (ref 1.7–7.7)
PLATELETS: 206 10*3/uL (ref 150–400)
RBC: 4.37 MIL/uL (ref 4.22–5.81)
RDW: 12.9 % (ref 11.5–15.5)
WBC: 5.8 10*3/uL (ref 4.0–10.5)

## 2014-09-06 LAB — COMPREHENSIVE METABOLIC PANEL
ALK PHOS: 145 U/L — AB (ref 39–117)
ALT: 93 U/L — AB (ref 0–53)
ANION GAP: 12 (ref 5–15)
AST: 36 U/L (ref 0–37)
Albumin: 3.8 g/dL (ref 3.5–5.2)
BUN: 11 mg/dL (ref 6–23)
CHLORIDE: 105 meq/L (ref 96–112)
CO2: 26 meq/L (ref 19–32)
Calcium: 8.7 mg/dL (ref 8.4–10.5)
Creatinine, Ser: 0.7 mg/dL (ref 0.50–1.35)
GLUCOSE: 107 mg/dL — AB (ref 70–99)
POTASSIUM: 3.9 meq/L (ref 3.7–5.3)
SODIUM: 143 meq/L (ref 137–147)
Total Protein: 7.3 g/dL (ref 6.0–8.3)

## 2014-09-06 MED ORDER — CLONAZEPAM 0.5 MG PO TABS: 0.5000 mg | ORAL_TABLET | Freq: Two times a day (BID) | ORAL | Status: DC | PRN

## 2014-09-06 MED ORDER — POLYETHYLENE GLYCOL 3350 17 G PO PACK: 17.0000 g | PACK | Freq: Every day | ORAL | Status: DC

## 2014-09-06 MED ORDER — ONDANSETRON 4 MG PO TBDP
4.0000 mg | ORAL_TABLET | Freq: Once | ORAL | Status: AC
  Administered 2014-09-06: 4 mg via ORAL
  Filled 2014-09-06: qty 1

## 2014-09-06 MED ORDER — CLONAZEPAM 1 MG PO TABS
1.0000 mg | ORAL_TABLET | Freq: Once | ORAL | Status: DC
  Filled 2014-09-06: qty 1

## 2014-09-06 MED ORDER — LORAZEPAM 1 MG PO TABS
1.0000 mg | ORAL_TABLET | Freq: Once | ORAL | Status: AC
  Administered 2014-09-06: 1 mg via ORAL
  Filled 2014-09-06: qty 1

## 2014-09-06 MED ORDER — ONDANSETRON 4 MG PO TBDP: ORAL_TABLET | ORAL | Status: DC

## 2014-09-06 MED ORDER — ONDANSETRON HCL 4 MG/2ML IJ SOLN: 4.0000 mg | Freq: Once | INTRAMUSCULAR | Status: DC

## 2014-09-06 NOTE — Discharge Instructions (Signed)

## 2014-09-06 NOTE — ED Provider Notes (Signed)
CSN: [id number]     Arrival date & time 09/06/14  1948 History  This chart was scribed for Arby BarretteMarcy Jady Braggs, MD by Tonye RoyaltyJoshua Chen, ED Scribe. This patient was seen in room MH06/MH06 and the patient's care was started at 9:26 PM.    Chief Complaint  Patient presents with  . Abdominal Pain   Patient is a 33 y.o. male presenting with abdominal pain. The history is provided by the patient. No language interpreter was used.  Abdominal Pain Pain location:  LUQ Pain radiates to:  Does not radiate Pain severity:  Moderate Onset quality:  Gradual Duration:  10 days Timing:  Constant Progression:  Unchanged Chronicity:  New Context comment:  Recently started using Librium Relieved by:  Nothing Worsened by:  Nothing tried Ineffective treatments:  None tried Associated symptoms: chest pain, constipation, cough, nausea and vomiting   Associated symptoms: no dysuria and no sore throat     HPI Comments: Javier KoyanagiJose Hill is a 33 y.o. male who presents to the Emergency Department complaining of LUQ abdominal pain with onset 10 days ago when he started Librium, [facility] where he presented to seizure. He has a history of seizures and uses Klonopine, Keppra, and Dilantin. Besides, abdominal pain, he has also had constipation, nausea, and vomiting, with 3 counts of vomiting today. He states that he noticed his bowel movements were dark today and also had chest pain. He states he has an ulcer but does not know the method by which it was diagnosed. He states he takes Ibuprofen occasionally for headaches, last used last night. He states he has also recently had cough and sinus congestion. He denies dysuria, difficulty urinating, other urinary problems, sore throat,   Past Medical History  Diagnosis Date  . Hypertension   . Anxiety epi  . HIV (human immunodeficiency virus infection)   . Seizure     last seizure 09/05/2014  . Epilepsy    Past Surgical History  Procedure Laterality Date  .  Orif clavicular fracture     Family History  Problem Relation Age of Onset  . Adopted: Yes   History  Substance Use Topics  . Smoking status: Current Every Day Smoker -- 0.10 packs/day    Types: Cigarettes  . Smokeless tobacco: Never Used  . Alcohol Use: No    Review of Systems  HENT: Negative for sore throat.   Respiratory: Positive for cough.   Cardiovascular: Positive for chest pain.  Gastrointestinal: Positive for nausea, vomiting, abdominal pain and constipation.  Genitourinary: Negative for dysuria.   10 Systems reviewed and all are negative for acute change except as noted in the HPI.   Allergies  Caffeine; Ciprofloxacin; Penicillins; Sulfamethoxazole-trimethoprim; Tramadol; Acyclovir and related; Haldol; Aloprim; Ambien; Bactericin; Bactrim; Other; Pneumovax; Prevnar; Watermelon flavor; and Zoloft  Home Medications   Prior to Admission medications   Medication Sig Start Date End Date Taking? Authorizing Provider  emtricitabine-tenofovir (TRUVADA) 200-300 MG per tablet Take 1 tablet by mouth daily. 09/03/14  Yes Randall Hissornelius N Van Dam, MD  ibuprofen (ADVIL,MOTRIN) 600 MG tablet Take 1 tablet (600 mg total) by mouth 3 (three) times daily as needed for headache. 07/19/14  Yes Ripudeep Jenna LuoK Rai, MD  levETIRAcetam (KEPPRA) 500 MG tablet Take 1 tablet (500 mg total) by mouth 2 (two) times daily. 08/21/14  Yes Jennifer L Piepenbrink, PA-C  lisinopril (PRINIVIL,ZESTRIL) 20 MG tablet Take 1 tablet (20 mg total) by mouth daily. 07/31/14  Yes Cliffton AstersJohn Campbell, MD  omeprazole (PRILOSEC OTC) 20 MG tablet  Take 20 mg by mouth daily.   Yes Historical Provider, MD  omeprazole (PRILOSEC) 40 MG capsule Take 1 capsule (40 mg total) by mouth daily. 07/19/14  Yes Ripudeep Jenna Luo, MD  phenytoin (DILANTIN) 100 MG ER capsule Take 1 capsule (100 mg total) by mouth 3 (three) times daily. 08/21/14  Yes Jennifer L Piepenbrink, PA-C  raltegravir (ISENTRESS) 400 MG tablet Take 1 tablet (400 mg total) by mouth 2  (two) times daily. 09/03/14  Yes Randall Hiss, MD  ALPRAZolam Prudy Feeler) 1 MG tablet Take 1 mg by mouth.    Historical Provider, MD  chlordiazePOXIDE (LIBRIUM) 25 MG capsule Take 25 mg by mouth 3 (three) times daily as needed for anxiety.    Historical Provider, MD  clonazePAM (KLONOPIN) 0.5 MG tablet Take 2 tablets (1 mg total) by mouth daily. 08/21/14   Jennifer L Piepenbrink, PA-C  clonazePAM (KLONOPIN) 0.5 MG tablet Take 1 tablet (0.5 mg total) by mouth 2 (two) times daily as needed for anxiety. 09/06/14   Arby Barrette, MD  cloNIDine-chlorthalidone (CLORPRES) 0.1-15 MG per tablet Take 1 tablet by mouth.    Historical Provider, MD  diphenhydrAMINE (BENADRYL) 25 MG tablet Take 1 tablet (25 mg total) by mouth every 6 (six) hours. Patient not taking: Reported on 08/21/2014 08/01/14   Melvenia Beam A Upstill, PA-C  gabapentin (NEURONTIN) 300 MG capsule Take 1 capsule (300 mg total) by mouth 3 (three) times daily. 08/02/14   Doris Cheadle, MD  ondansetron (ZOFRAN ODT) 4 MG disintegrating tablet 4mg  ODT q4 hours prn nausea/vomit 09/06/14   Arby Barrette, MD  polyethylene glycol (MIRALAX / GLYCOLAX) packet Take 17 g by mouth daily. 09/06/14   Arby Barrette, MD  promethazine (PHENERGAN) 25 MG suppository Place 1 suppository (25 mg total) rectally every 8 (eight) hours as needed for nausea or vomiting. 08/21/14   Jennifer L Piepenbrink, PA-C   BP 133/78 mmHg  Pulse 62  Temp(Src) 98 F (36.7 C) (Oral)  Resp 16  Ht 5\' 4"  (1.626 m)  Wt 130 lb (58.968 kg)  BMI 22.30 kg/m2  SpO2 99% Physical Exam  Constitutional: He is oriented to person, place, and time. He appears well-developed and well-nourished.  HENT:  Head: Normocephalic and atraumatic.  Eyes: EOM are normal. Pupils are equal, round, and reactive to light.  Neck: Neck supple.  Cardiovascular: Normal rate, regular rhythm, normal heart sounds and intact distal pulses.   Pulmonary/Chest: Effort normal and breath sounds normal.  Abdominal: Soft.  Bowel sounds are normal. He exhibits no distension and no mass. Tenderness: the patient has moderate left upper quadrant tenderness. No guarding no rebound no palpable mass. There is no rebound and no guarding.  Musculoskeletal: Normal range of motion. He exhibits no edema or tenderness.  Neurological: He is alert and oriented to person, place, and time. He has normal strength. Coordination normal. GCS eye subscore is 4. GCS verbal subscore is 5. GCS motor subscore is 6.  Skin: Skin is warm, dry and intact.  Psychiatric: He has a normal mood and affect.    ED Course  Procedures (including critical care time)  DIAGNOSTIC STUDIES: Oxygen Saturation is 100% on room air, normal by my interpretation.    COORDINATION OF CARE: 9:33 PM Discussed treatment plan with patient at beside, the patient agrees with the plan and has no further questions at this time.   Labs Review Labs Reviewed  COMPREHENSIVE METABOLIC PANEL - Abnormal; Notable for the following:    Glucose, Bld 107 (*)  ALT 93 (*)    Alkaline Phosphatase 145 (*)    Total Bilirubin <0.2 (*)    All other components within normal limits  CBC WITH DIFFERENTIAL    Imaging Review Dg Abd Acute W/chest  09/06/2014   CLINICAL DATA:  Left lower quadrant pain for 1 week with fever and nausea, vomiting, diarrhea.  EXAM: ACUTE ABDOMEN SERIES (ABDOMEN 2 VIEW & CHEST 1 VIEW)  COMPARISON:  None.  FINDINGS: There is no evidence of dilated bowel loops or free intraperitoneal air. No radiopaque calculi or other significant radiographic abnormality is seen. There is scoliosis of spine. Moderate bowel content is noted throughout colon. Heart size and mediastinal contours are within normal limits. Both lungs are clear.  IMPRESSION: No bowel obstruction or free air. Moderate bowel content throughout colon. No acute cardiopulmonary disease.   Electronically Signed   By: Sherian ReinWei-Chen  Lin M.D.   On: 09/06/2014 21:29     EKG Interpretation None      MDM    Final diagnoses:  Pain  Left upper quadrant pain   At this point there is no specific etiology identified for the patient's symptoms. He's had left upper quadrant pain for about 10 days duration now. He identifies the majority of his symptoms starting after having been started on Librium. At that time he was being seen for seizure disorder. The patient worse that he usually takes Klonopin and has been out of that. There is no fever present, the patient's vital signs are stable, diagnostic workup is unremarkable this point. There is suggestion of constipation on acute abdominal series. The patient did have a CT scan done 2 weeks ago which was negative at that point in time. His examination is nonsurgical in nature and I did not feel that repeat CT was indicated at this time. The patient will be treated for symptoms and advised to try me relax. He is counseled to call his family physician tomorrow morning to schedule recheck.   Arby BarretteMarcy Jazminn Pomales, MD 09/06/14 [phone]

## 2014-09-06 NOTE — ED Notes (Signed)
abd pain and vomiting x 1week

## 2014-09-06 NOTE — ED Notes (Signed)
Pt c/o abd pain x 1 week w nausea/vomiting,  States had been constipated for last 2 days and had 2 black stools today

## 2014-09-28 DIAGNOSIS — G43909 Migraine, unspecified, not intractable, without status migrainosus: Secondary | ICD-10-CM

## 2014-09-28 HISTORY — DX: Migraine, unspecified, not intractable, without status migrainosus: G43.909

## 2014-10-18 ENCOUNTER — Other Ambulatory Visit: Payer: Self-pay | Admitting: *Deleted

## 2014-10-18 DIAGNOSIS — B20 Human immunodeficiency virus [HIV] disease: Secondary | ICD-10-CM

## 2014-10-18 MED ORDER — EMTRICITABINE-TENOFOVIR DF 200-300 MG PO TABS: 1.0000 | ORAL_TABLET | Freq: Every day | ORAL | Status: DC

## 2014-10-18 MED ORDER — RALTEGRAVIR POTASSIUM 400 MG PO TABS: 400.0000 mg | ORAL_TABLET | Freq: Two times a day (BID) | ORAL | Status: DC

## 2014-10-23 ENCOUNTER — Ambulatory Visit (INDEPENDENT_AMBULATORY_CARE_PROVIDER_SITE_OTHER): Payer: Self-pay | Admitting: Infectious Diseases

## 2014-10-23 ENCOUNTER — Encounter: Payer: Self-pay | Admitting: Infectious Diseases

## 2014-10-23 VITALS — BP 154/97 | HR 101 | Temp 98.9°F | Wt 144.4 lb

## 2014-10-23 DIAGNOSIS — F064 Anxiety disorder due to known physiological condition: Secondary | ICD-10-CM

## 2014-10-23 DIAGNOSIS — A609 Anogenital herpesviral infection, unspecified: Secondary | ICD-10-CM

## 2014-10-23 DIAGNOSIS — Z21 Asymptomatic human immunodeficiency virus [HIV] infection status: Secondary | ICD-10-CM

## 2014-10-23 DIAGNOSIS — A6002 Herpesviral infection of other male genital organs: Secondary | ICD-10-CM

## 2014-10-23 DIAGNOSIS — I10 Essential (primary) hypertension: Secondary | ICD-10-CM

## 2014-10-23 DIAGNOSIS — Z79899 Other long term (current) drug therapy: Secondary | ICD-10-CM

## 2014-10-23 DIAGNOSIS — F410 Panic disorder [episodic paroxysmal anxiety] without agoraphobia: Secondary | ICD-10-CM

## 2014-10-23 DIAGNOSIS — Z113 Encounter for screening for infections with a predominantly sexual mode of transmission: Secondary | ICD-10-CM

## 2014-10-23 DIAGNOSIS — B20 Human immunodeficiency virus [HIV] disease: Secondary | ICD-10-CM

## 2014-10-23 DIAGNOSIS — R569 Unspecified convulsions: Secondary | ICD-10-CM

## 2014-10-23 LAB — CBC
HEMATOCRIT: 46.7 % (ref 39.0–52.0)
Hemoglobin: 16 g/dL (ref 13.0–17.0)
MCH: 32.2 pg (ref 26.0–34.0)
MCHC: 34.3 g/dL (ref 30.0–36.0)
MCV: 94 fL (ref 78.0–100.0)
MPV: 10.2 fL (ref 8.6–12.4)
Platelets: 264 10*3/uL (ref 150–400)
RBC: 4.97 MIL/uL (ref 4.22–5.81)
RDW: 13.6 % (ref 11.5–15.5)
WBC: 7 10*3/uL (ref 4.0–10.5)

## 2014-10-23 LAB — COMPREHENSIVE METABOLIC PANEL
ALK PHOS: 106 U/L (ref 39–117)
ALT: 52 U/L (ref 0–53)
AST: 26 U/L (ref 0–37)
Albumin: 4.5 g/dL (ref 3.5–5.2)
BUN: 9 mg/dL (ref 6–23)
CO2: 28 meq/L (ref 19–32)
Calcium: 9.4 mg/dL (ref 8.4–10.5)
Chloride: 103 mEq/L (ref 96–112)
Creat: 0.72 mg/dL (ref 0.50–1.35)
GLUCOSE: 91 mg/dL (ref 70–99)
Potassium: 4.1 mEq/L (ref 3.5–5.3)
Sodium: 138 mEq/L (ref 135–145)
TOTAL PROTEIN: 7.6 g/dL (ref 6.0–8.3)
Total Bilirubin: 0.4 mg/dL (ref 0.2–1.2)

## 2014-10-23 LAB — LIPID PANEL
CHOLESTEROL: 130 mg/dL (ref 0–200)
HDL: 56 mg/dL (ref >39)
LDL CALC: 51 mg/dL (ref 0–99)
TRIGLYCERIDES: 113 mg/dL (ref <150)
Total CHOL/HDL Ratio: 2.3 Ratio
VLDL: 23 mg/dL (ref 0–40)

## 2014-10-23 LAB — RPR

## 2014-10-23 MED ORDER — VALACYCLOVIR HCL 1 G PO TABS: 1000.0000 mg | ORAL_TABLET | Freq: Two times a day (BID) | ORAL | Status: DC

## 2014-10-23 MED ORDER — TEMAZEPAM 15 MG PO CAPS: 15.0000 mg | ORAL_CAPSULE | Freq: Every evening | ORAL | Status: DC | PRN

## 2014-10-23 NOTE — Assessment & Plan Note (Signed)
Will give him rx for restoril. Greatly appreciate Mitch McGee's help in getting him into psychiatry clinic.

## 2014-10-23 NOTE — Assessment & Plan Note (Signed)
Appears to be doing well. Will check his labs today. He is offered/refuses condoms. His partner is tested today. rtc in 5 months.

## 2014-10-23 NOTE — Assessment & Plan Note (Signed)
Will refer him to Neuro

## 2014-10-23 NOTE — Assessment & Plan Note (Signed)
Needs PCP

## 2014-10-23 NOTE — Assessment & Plan Note (Signed)
Will write him rx for valtrex.

## 2014-10-23 NOTE — Progress Notes (Signed)
   Subjective:    Patient ID: Javier Hill, male    DOB: 04-21-1981, 34 y.o.   MRN: [id number]  HPI 34 yo M with hx of HIV+ since 2013, currently on ISN/TRV (his only regiemen). Previously offered FDC but refused.  He was admitted 07-18-14 to 07-19-14 with status epilepticus. He required intubation. He had been off his keppra and klonipin.  He also has a hx of HTN.   Over last 2 weeks has  Had itching and burning on his genitals. No new medicines.  Last seizure was 2 days ago. Has not been able to get into neuro.  Is taking lisinopril for his BP.  Has been out of klonipin, [facility] would not refill/see him as he does not have a SSN. Is not able to sleep.    HIV 1 RNA QUANT (copies/mL)  Date Value  07/19/2014 <20  07/18/2014 <20   CD4 T CELL ABS (/uL)  Date Value  07/18/2014 340*   Review of Systems  Constitutional: Positive for appetite change. Negative for unexpected weight change.  Cardiovascular: Negative for chest pain.  Gastrointestinal: Negative for diarrhea and constipation.  Genitourinary: Negative for difficulty urinating.  Neurological: Positive for headaches.  Psychiatric/Behavioral: Positive for sleep disturbance. The patient is nervous/anxious.   wt up 14# Denies new partners.      Objective:   Physical Exam  Constitutional: He appears well-developed and well-nourished.  HENT:  Mouth/Throat: No oropharyngeal exudate.  Eyes: EOM are normal. Pupils are equal, round, and reactive to light.  Neck: Neck supple.  Cardiovascular: Normal rate, regular rhythm and normal heart sounds.   Pulmonary/Chest: Effort normal and breath sounds normal.  Abdominal: Soft. Bowel sounds are normal. He exhibits no distension. There is no tenderness.  Genitourinary:     Lymphadenopathy:    He has no cervical adenopathy.          Assessment & Plan:

## 2014-10-24 LAB — T-HELPER CELL (CD4) - (RCID CLINIC ONLY)
CD4 T CELL HELPER: 35 % (ref 33–55)
CD4 T Cell Abs: 1070 /uL (ref 400–2700)

## 2014-10-24 LAB — URINE CYTOLOGY ANCILLARY ONLY
Chlamydia: NEGATIVE
Neisseria Gonorrhea: NEGATIVE

## 2014-10-25 LAB — HIV-1 RNA QUANT-NO REFLEX-BLD: HIV-1 RNA Quant, Log: <1.3 {Log} (ref <1.30)

## 2014-10-31 ENCOUNTER — Other Ambulatory Visit: Payer: Self-pay | Admitting: *Deleted

## 2014-10-31 DIAGNOSIS — B20 Human immunodeficiency virus [HIV] disease: Secondary | ICD-10-CM

## 2014-10-31 MED ORDER — RALTEGRAVIR POTASSIUM 400 MG PO TABS: 400.0000 mg | ORAL_TABLET | Freq: Two times a day (BID) | ORAL | Status: DC

## 2014-10-31 MED ORDER — EMTRICITABINE-TENOFOVIR DF 200-300 MG PO TABS: 1.0000 | ORAL_TABLET | Freq: Every day | ORAL | Status: DC

## 2014-10-31 NOTE — Telephone Encounter (Signed)
ADAP Application 

## 2014-11-05 ENCOUNTER — Other Ambulatory Visit: Payer: Self-pay

## 2014-11-19 ENCOUNTER — Ambulatory Visit (INDEPENDENT_AMBULATORY_CARE_PROVIDER_SITE_OTHER): Payer: Self-pay | Admitting: Infectious Diseases

## 2014-11-19 ENCOUNTER — Encounter: Payer: Self-pay | Admitting: Infectious Diseases

## 2014-11-19 VITALS — BP 143/94 | HR 60 | Temp 98.4°F | Wt 147.0 lb

## 2014-11-19 DIAGNOSIS — Z23 Encounter for immunization: Secondary | ICD-10-CM

## 2014-11-19 DIAGNOSIS — I10 Essential (primary) hypertension: Secondary | ICD-10-CM

## 2014-11-19 DIAGNOSIS — B20 Human immunodeficiency virus [HIV] disease: Secondary | ICD-10-CM

## 2014-11-19 DIAGNOSIS — Z21 Asymptomatic human immunodeficiency virus [HIV] infection status: Secondary | ICD-10-CM

## 2014-11-19 DIAGNOSIS — R569 Unspecified convulsions: Secondary | ICD-10-CM

## 2014-11-19 DIAGNOSIS — F064 Anxiety disorder due to known physiological condition: Secondary | ICD-10-CM

## 2014-11-19 DIAGNOSIS — F410 Panic disorder [episodic paroxysmal anxiety] without agoraphobia: Secondary | ICD-10-CM

## 2014-11-19 MED ORDER — TEMAZEPAM 30 MG PO CAPS: 30.0000 mg | ORAL_CAPSULE | Freq: Every evening | ORAL | Status: DC | PRN

## 2014-11-19 NOTE — Addendum Note (Signed)
Addended by: Wendall MolaOCKERHAM, Sakura Denis A on: 11/19/2014 11:52 AM   Modules accepted: Orders

## 2014-11-19 NOTE — Assessment & Plan Note (Signed)
Needs orange card, needs to be seen by neuro.

## 2014-11-19 NOTE — Progress Notes (Signed)
   Subjective:    Patient ID: Javier Hill, male    DOB: 1981-03-12, 34 y.o.   MRN: [id number]  HPI 34 yo M with hx of HIV+ since 2013, currently on ISN/TRV (his only regiemen). Previously offered FDC but refused.  He was admitted 07-18-14 to 07-19-14 with status epilepticus. He required intubation. He had been off his keppra and klonipin.  He also has a hx of HTN (on lisinopril).  States that his anxiety has been up. [facility] as he does not have a SSN. He also got no relief from sleeping rx he was written last month (restoril). Has been meeting with Mitch. Was given paperwork to fill out for orange card, left at home. Last seizure was 2 days ago. Has been taking his dilantin and keppra.   HIV 1 RNA QUANT (copies/mL)  Date Value  10/23/2014 <20  07/19/2014 <20  07/18/2014 <20   CD4 T CELL ABS (/uL)  Date Value  10/23/2014 1070  07/18/2014 340*   Review of Systems  Constitutional: Negative for appetite change and unexpected weight change.  Gastrointestinal: Negative for diarrhea and constipation.  Genitourinary: Negative for difficulty urinating.  Psychiatric/Behavioral: Positive for sleep disturbance. The patient is nervous/anxious.       Objective:   Physical Exam  Constitutional: He appears well-developed and well-nourished.  HENT:  Mouth/Throat: No oropharyngeal exudate.  Eyes: EOM are normal. Pupils are equal, round, and reactive to light.  Neck: Neck supple.  Cardiovascular: Normal rate, regular rhythm and normal heart sounds.   Pulmonary/Chest: Effort normal and breath sounds normal.  Abdominal: Soft. Bowel sounds are normal. He exhibits no distension. There is no tenderness.  Lymphadenopathy:    He has no cervical adenopathy.  Psychiatric: His mood appears not anxious. His affect is not blunt and not labile.          Assessment & Plan:

## 2014-11-19 NOTE — Assessment & Plan Note (Signed)
Fairly well controlled today.  

## 2014-11-19 NOTE — Assessment & Plan Note (Signed)
He is doing very well. Will see him back in 6 months.  Start hep b series.

## 2014-11-19 NOTE — Assessment & Plan Note (Signed)
Will see kenny in 1 week.  Will increase his restoril.  Needs to get into [facility], get orange card.  We will not be taking on his anxiolytics.

## 2014-11-26 ENCOUNTER — Ambulatory Visit: Payer: Self-pay

## 2014-12-04 ENCOUNTER — Encounter (HOSPITAL_BASED_OUTPATIENT_CLINIC_OR_DEPARTMENT_OTHER): Payer: Self-pay | Admitting: *Deleted

## 2014-12-04 ENCOUNTER — Emergency Department (HOSPITAL_BASED_OUTPATIENT_CLINIC_OR_DEPARTMENT_OTHER)
Admission: EM | Admit: 2014-12-04 | Discharge: 2014-12-04 | Disposition: A | Discharge status: Home / Self Care | Payer: Self-pay | Attending: Emergency Medicine | Admitting: Emergency Medicine

## 2014-12-04 DIAGNOSIS — B20 Human immunodeficiency virus [HIV] disease: Secondary | ICD-10-CM | POA: Insufficient documentation

## 2014-12-04 DIAGNOSIS — M545 Low back pain, unspecified: Secondary | ICD-10-CM

## 2014-12-04 DIAGNOSIS — H6092 Unspecified otitis externa, left ear: Secondary | ICD-10-CM | POA: Insufficient documentation

## 2014-12-04 DIAGNOSIS — I10 Essential (primary) hypertension: Secondary | ICD-10-CM | POA: Insufficient documentation

## 2014-12-04 DIAGNOSIS — Z79899 Other long term (current) drug therapy: Secondary | ICD-10-CM | POA: Insufficient documentation

## 2014-12-04 DIAGNOSIS — G40909 Epilepsy, unspecified, not intractable, without status epilepticus: Secondary | ICD-10-CM | POA: Insufficient documentation

## 2014-12-04 DIAGNOSIS — R519 Headache, unspecified: Secondary | ICD-10-CM

## 2014-12-04 DIAGNOSIS — F419 Anxiety disorder, unspecified: Secondary | ICD-10-CM | POA: Insufficient documentation

## 2014-12-04 DIAGNOSIS — Z880 Allergy status to penicillin: Secondary | ICD-10-CM | POA: Insufficient documentation

## 2014-12-04 DIAGNOSIS — R51 Headache: Secondary | ICD-10-CM | POA: Insufficient documentation

## 2014-12-04 DIAGNOSIS — Z720 Tobacco use: Secondary | ICD-10-CM | POA: Insufficient documentation

## 2014-12-04 MED ORDER — HYDROCODONE-ACETAMINOPHEN 5-325 MG PO TABS: 1.0000 | ORAL_TABLET | Freq: Four times a day (QID) | ORAL | Status: DC | PRN

## 2014-12-04 MED ORDER — METOCLOPRAMIDE HCL 10 MG PO TABS: 10.0000 mg | ORAL_TABLET | Freq: Four times a day (QID) | ORAL | Status: DC | PRN

## 2014-12-04 MED ORDER — FENTANYL CITRATE 0.05 MG/ML IJ SOLN
200.0000 ug | Freq: Once | INTRAMUSCULAR | Status: AC
  Administered 2014-12-04: 200 ug via INTRAMUSCULAR
  Filled 2014-12-04: qty 4

## 2014-12-04 MED ORDER — NEOMYCIN-COLIST-HC-THONZONIUM 3.3-3-10-0.5 MG/ML OT SUSP
4.0000 [drp] | Freq: Four times a day (QID) | OTIC | Status: DC
  Administered 2014-12-04: 4 [drp] via OTIC

## 2014-12-04 MED ORDER — ONDANSETRON 8 MG PO TBDP: 8.0000 mg | ORAL_TABLET | Freq: Three times a day (TID) | ORAL | Status: DC | PRN

## 2014-12-04 MED ORDER — ONDANSETRON 8 MG PO TBDP
8.0000 mg | ORAL_TABLET | Freq: Once | ORAL | Status: AC
  Administered 2014-12-04: 8 mg via ORAL
  Filled 2014-12-04: qty 1

## 2014-12-04 MED ORDER — CLONAZEPAM 1 MG PO TABS: 0.5000 mg | ORAL_TABLET | Freq: Two times a day (BID) | ORAL | Status: DC | PRN

## 2014-12-04 NOTE — ED Provider Notes (Addendum)
CSN: [id number]     Arrival date & time 12/04/14  0622 History   First MD Initiated Contact with Patient 12/04/14 [phone]     Chief Complaint  Patient presents with  . Headache     (Consider location/radiation/quality/duration/timing/severity/associated sxs/prior Treatment) HPI  This is a 34 year old male with well-controlled HIV disease. His CD4 count as of 2 months ago was over a thousand. He also has a history of anxiety and seizure disorder. He is here with a one-week history of a headache. The headache is described as involving the left side of his head and face. It is throbbing in nature. He woke this morning about 4 AM with severe pain in the left ear. This is exacerbated his headache. The pain in his ear is worse with movement of the external auditory canal or with speaking. There is been no drainage from the ear. He denies fever. He denies any numbness or weakness. He has had low back pain with muscle spasms for the past week as well as increased anxiety; he states his physician is discontinued his clonazepam and he has not been able to get [facility] because he does not have a [facility].  Past Medical History  Diagnosis Date  . Hypertension   . Anxiety epi  . HIV (human immunodeficiency virus infection)   . Seizure     last seizure 09/05/2014  . Epilepsy    Past Surgical History  Procedure Laterality Date  . Orif clavicular fracture     Family History  Problem Relation Age of Onset  . Adopted: Yes   History  Substance Use Topics  . Smoking status: Current Every Day Smoker -- 0.10 packs/day    Types: Cigarettes  . Smokeless tobacco: Never Used     Comment: cutting back  . Alcohol Use: No    Review of Systems  All other systems reviewed and are negative.   Allergies  Caffeine; Ciprofloxacin; Penicillins; Sulfamethoxazole-trimethoprim; Tramadol; Acyclovir and related; Haldol; Aloprim; Ambien; Bactericin; Bactrim; Other; Pneumovax; Prevnar; Watermelon  flavor; and Zoloft  Home Medications   Prior to Admission medications   Medication Sig Start Date End Date Taking? Authorizing Provider  ALPRAZolam Prudy Feeler(XANAX) 1 MG tablet Take 1 mg by mouth.    Historical Provider, MD  chlordiazePOXIDE (LIBRIUM) 25 MG capsule Take 25 mg by mouth 3 (three) times daily as needed for anxiety.    Historical Provider, MD  clonazePAM (KLONOPIN) 0.5 MG tablet Take 2 tablets (1 mg total) by mouth daily. Patient not taking: Reported on 10/23/2014 08/21/14   Francee PiccoloJennifer Piepenbrink, PA-C  clonazePAM (KLONOPIN) 0.5 MG tablet Take 1 tablet (0.5 mg total) by mouth 2 (two) times daily as needed for anxiety. Patient not taking: Reported on 10/23/2014 09/06/14   Arby BarretteMarcy Pfeiffer, MD  cloNIDine-chlorthalidone (CLORPRES) 0.1-15 MG per tablet Take 1 tablet by mouth.    Historical Provider, MD  diphenhydrAMINE (BENADRYL) 25 MG tablet Take 1 tablet (25 mg total) by mouth every 6 (six) hours. Patient not taking: Reported on 08/21/2014 08/01/14   Elpidio AnisShari Upstill, PA-C  emtricitabine-tenofovir (TRUVADA) 200-300 MG per tablet Take 1 tablet by mouth daily. 10/31/14   Cliffton AstersJohn Campbell, MD  gabapentin (NEURONTIN) 300 MG capsule Take 1 capsule (300 mg total) by mouth 3 (three) times daily. Patient not taking: Reported on 10/23/2014 08/02/14   Doris Cheadleeepak Advani, MD  ibuprofen (ADVIL,MOTRIN) 600 MG tablet Take 1 tablet (600 mg total) by mouth 3 (three) times daily as needed for headache. Patient not taking: Reported on 10/23/2014  07/19/14   Ripudeep Jenna Luo, MD  levETIRAcetam (KEPPRA) 500 MG tablet Take 1 tablet (500 mg total) by mouth 2 (two) times daily. 08/21/14   Jennifer Piepenbrink, PA-C  lisinopril (PRINIVIL,ZESTRIL) 20 MG tablet Take 1 tablet (20 mg total) by mouth daily. 07/31/14   Cliffton Asters, MD  omeprazole (PRILOSEC OTC) 20 MG tablet Take 20 mg by mouth daily.    Historical Provider, MD  omeprazole (PRILOSEC) 40 MG capsule Take 1 capsule (40 mg total) by mouth daily. Patient not taking: Reported on  10/23/2014 07/19/14   Ripudeep Jenna Luo, MD  ondansetron (ZOFRAN ODT) 4 MG disintegrating tablet  ODT q4 hours prn nausea/vomit 09/06/14   Arby Barrette, MD  phenytoin (DILANTIN) 100 MG ER capsule Take 1 capsule (100 mg total) by mouth 3 (three) times daily. 08/21/14   Jennifer Piepenbrink, PA-C  polyethylene glycol (MIRALAX / GLYCOLAX) packet Take 17 g by mouth daily. 09/06/14   Arby Barrette, MD  promethazine (PHENERGAN) 25 MG suppository Place 1 suppository (25 mg total) rectally every 8 (eight) hours as needed for nausea or vomiting. Patient not taking: Reported on 10/23/2014 08/21/14   Francee Piccolo, PA-C  raltegravir (ISENTRESS) 400 MG tablet Take 1 tablet (400 mg total) by mouth 2 (two) times daily. 10/31/14   Cliffton Asters, MD  temazepam (RESTORIL) 30 MG capsule Take 1 capsule (30 mg total) by mouth at bedtime as needed for sleep. 11/19/14   Ginnie Smart, MD  valACYclovir (VALTREX) 1000 MG tablet Take 1 tablet (1,000 mg total) by mouth 2 (two) times daily. 10/23/14   Ginnie Smart, MD   BP 139/90 mmHg  Pulse 82  Temp(Src) 98.3 F (36.8 C) (Oral)  Resp 20  Ht  (1.626 m)  Wt 147 lb (66.679 kg)  BMI 25.22 kg/m2  SpO2 100%   Physical Exam  General: Well-developed, well-nourished male in no acute distress; appearance consistent with age of record HENT: normocephalic; atraumatic; erythema of left external auditory canal with tenderness on movement of the external auditory canal, no significant edema or exudate seen Eyes: pupils equal, round and reactive to light; extraocular muscles intact Neck: supple; no meningeal signs Heart: regular rate and rhythm Lungs: clear to auscultation bilaterally Abdomen: soft; nondistended; nontender; bowel sounds present Back: Right paralumbar tenderness Extremities: No deformity; full range of motion; pulses normal Neurologic: Awake, alert and oriented; motor function intact in all extremities and symmetric; no facial droop Skin: Warm  and dry Psychiatric: Flat affect    ED Course  Procedures (including critical care time)   MDM  Will treat for otitis media, headache, muscle spasms and anxiety. Due to the patient's complex medical history as well as polypharmacy he was advised to contact his PCP today for follow-up. He will need his PCP to manage his conditions on a longer-term basis than is possible in the ED.  Paula Libra, MD 12/04/14 1610  Paula Libra, MD 12/04/14 [phone]

## 2014-12-04 NOTE — ED Notes (Signed)
Dr. Molpus at [facility] 

## 2014-12-04 NOTE — ED Notes (Signed)
C/o HA, L ear ache, back ache and muscle spasms. Ongoing for ~ 1 week. L ear/head is the most bothersome. Rates HA 8/10. Reports anxiety is increased at his time. [facility] Pt alert, NAD, calm, interactive, resps e/u. Pain increased with speaking. Family mostly speaking for pt.

## 2014-12-13 ENCOUNTER — Emergency Department (HOSPITAL_BASED_OUTPATIENT_CLINIC_OR_DEPARTMENT_OTHER): Payer: Self-pay

## 2014-12-13 ENCOUNTER — Encounter (HOSPITAL_BASED_OUTPATIENT_CLINIC_OR_DEPARTMENT_OTHER): Payer: Self-pay

## 2014-12-13 ENCOUNTER — Emergency Department (HOSPITAL_BASED_OUTPATIENT_CLINIC_OR_DEPARTMENT_OTHER)
Admission: EM | Admit: 2014-12-13 | Discharge: 2014-12-13 | Disposition: A | Discharge status: Home / Self Care | Payer: Self-pay | Attending: Emergency Medicine | Admitting: Emergency Medicine

## 2014-12-13 DIAGNOSIS — R110 Nausea: Secondary | ICD-10-CM | POA: Insufficient documentation

## 2014-12-13 DIAGNOSIS — Z720 Tobacco use: Secondary | ICD-10-CM | POA: Insufficient documentation

## 2014-12-13 DIAGNOSIS — I10 Essential (primary) hypertension: Secondary | ICD-10-CM | POA: Insufficient documentation

## 2014-12-13 DIAGNOSIS — R51 Headache: Secondary | ICD-10-CM | POA: Insufficient documentation

## 2014-12-13 DIAGNOSIS — M545 Low back pain, unspecified: Secondary | ICD-10-CM

## 2014-12-13 DIAGNOSIS — Z79899 Other long term (current) drug therapy: Secondary | ICD-10-CM | POA: Insufficient documentation

## 2014-12-13 DIAGNOSIS — R519 Headache, unspecified: Secondary | ICD-10-CM

## 2014-12-13 DIAGNOSIS — B20 Human immunodeficiency virus [HIV] disease: Secondary | ICD-10-CM | POA: Insufficient documentation

## 2014-12-13 DIAGNOSIS — G40909 Epilepsy, unspecified, not intractable, without status epilepticus: Secondary | ICD-10-CM | POA: Insufficient documentation

## 2014-12-13 DIAGNOSIS — F419 Anxiety disorder, unspecified: Secondary | ICD-10-CM | POA: Insufficient documentation

## 2014-12-13 LAB — CBC WITH DIFFERENTIAL/PLATELET
BASOS PCT: 0 % (ref 0–1)
Basophils Absolute: 0 10*3/uL (ref 0.0–0.1)
EOS ABS: 0.1 10*3/uL (ref 0.0–0.7)
EOS PCT: 1 % (ref 0–5)
HEMATOCRIT: 44.7 % (ref 39.0–52.0)
Hemoglobin: 15.2 g/dL (ref 13.0–17.0)
Lymphocytes Relative: 37 % (ref 12–46)
Lymphs Abs: 2.6 10*3/uL (ref 0.7–4.0)
MCH: 32 pg (ref 26.0–34.0)
MCHC: 34 g/dL (ref 30.0–36.0)
MCV: 94.1 fL (ref 78.0–100.0)
MONO ABS: 0.5 10*3/uL (ref 0.1–1.0)
Monocytes Relative: 8 % (ref 3–12)
Neutro Abs: 3.8 10*3/uL (ref 1.7–7.7)
Neutrophils Relative %: 54 % (ref 43–77)
PLATELETS: 216 10*3/uL (ref 150–400)
RBC: 4.75 MIL/uL (ref 4.22–5.81)
RDW: 12.8 % (ref 11.5–15.5)
WBC: 7.1 10*3/uL (ref 4.0–10.5)

## 2014-12-13 LAB — COMPREHENSIVE METABOLIC PANEL
ALK PHOS: 109 U/L (ref 39–117)
ALT: 176 U/L — ABNORMAL HIGH (ref 0–53)
AST: 99 U/L — ABNORMAL HIGH (ref 0–37)
Albumin: 4.3 g/dL (ref 3.5–5.2)
Anion gap: 6 (ref 5–15)
BILIRUBIN TOTAL: 0.3 mg/dL (ref 0.3–1.2)
BUN: 17 mg/dL (ref 6–23)
CHLORIDE: 104 mmol/L (ref 96–112)
CO2: 27 mmol/L (ref 19–32)
CREATININE: 0.93 mg/dL (ref 0.50–1.35)
Calcium: 8.9 mg/dL (ref 8.4–10.5)
Glucose, Bld: 105 mg/dL — ABNORMAL HIGH (ref 70–99)
POTASSIUM: 4.3 mmol/L (ref 3.5–5.1)
SODIUM: 137 mmol/L (ref 135–145)
Total Protein: 7.5 g/dL (ref 6.0–8.3)

## 2014-12-13 LAB — URINALYSIS, ROUTINE W REFLEX MICROSCOPIC
Bilirubin Urine: NEGATIVE
Glucose, UA: NEGATIVE mg/dL
HGB URINE DIPSTICK: NEGATIVE
KETONES UR: NEGATIVE mg/dL
Leukocytes, UA: NEGATIVE
Nitrite: NEGATIVE
PROTEIN: NEGATIVE mg/dL
Specific Gravity, Urine: 1.005 (ref 1.005–1.030)
Urobilinogen, UA: 0.2 mg/dL (ref 0.0–1.0)
pH: 7 (ref 5.0–8.0)

## 2014-12-13 MED ORDER — HYDROMORPHONE HCL 1 MG/ML IJ SOLN
1.0000 mg | Freq: Once | INTRAMUSCULAR | Status: AC
  Administered 2014-12-13: 1 mg via INTRAVENOUS
  Filled 2014-12-13: qty 1

## 2014-12-13 MED ORDER — IOHEXOL 300 MG/ML  SOLN
80.0000 mL | Freq: Once | INTRAMUSCULAR | Status: AC | PRN
  Administered 2014-12-13: 80 mL via INTRAVENOUS

## 2014-12-13 MED ORDER — DIPHENHYDRAMINE HCL 50 MG/ML IJ SOLN
25.0000 mg | Freq: Once | INTRAMUSCULAR | Status: AC
  Administered 2014-12-13: 25 mg via INTRAVENOUS
  Filled 2014-12-13: qty 1

## 2014-12-13 MED ORDER — SODIUM CHLORIDE 0.9 % IV BOLUS (SEPSIS)
1000.0000 mL | Freq: Once | INTRAVENOUS | Status: AC
  Administered 2014-12-13: 1000 mL via INTRAVENOUS

## 2014-12-13 MED ORDER — OXYCODONE HCL 5 MG PO TABS: 5.0000 mg | ORAL_TABLET | Freq: Four times a day (QID) | ORAL | Status: DC | PRN

## 2014-12-13 MED ORDER — METOCLOPRAMIDE HCL 5 MG/ML IJ SOLN
10.0000 mg | Freq: Once | INTRAMUSCULAR | Status: AC
  Administered 2014-12-13: 10 mg via INTRAVENOUS
  Filled 2014-12-13: qty 2

## 2014-12-13 NOTE — ED Notes (Signed)
Patient transported to CT 

## 2014-12-13 NOTE — ED Provider Notes (Signed)
CSN: [id number]     Arrival date & time 12/13/14  5784 History   First MD Initiated Contact with Patient 12/13/14 [phone]     Chief Complaint  Patient presents with  . Back Pain     (Consider location/radiation/quality/duration/timing/severity/associated sxs/prior Treatment) HPI  34 year old male presents with intermittent headache and back pain for the past 2 weeks. One week ago he was seen here and diagnosed with otitis externa. He took the drops as recommended and states his ear has felt much better. However he is still having intermittent headaches and back pain. The headache is left-sided. There is no study of photophobia or visual disturbance. He has no neck pain or stiffness. No weakness or numbness. His back pain is bilateral but does not include the midline. There is no weakness or numbness in his legs. Pain does not radiate. Both of these pains seem to come and go without any consistency. He's tried the hydrocodone he was given without any relief. Patient has a history of HIV with a recent T-cell count over 1000. Has had 1 seizure in past 2 weeks, not atypical for him. On keppra for seizures.  Past Medical History  Diagnosis Date  . Hypertension   . Anxiety epi  . HIV (human immunodeficiency virus infection)   . Seizure     last seizure 09/05/2014  . Epilepsy    Past Surgical History  Procedure Laterality Date  . Orif clavicular fracture     Family History  Problem Relation Age of Onset  . Adopted: Yes   History  Substance Use Topics  . Smoking status: Current Every Day Smoker -- 0.10 packs/day    Types: Cigarettes  . Smokeless tobacco: Never Used     Comment: cutting back  . Alcohol Use: No    Review of Systems  Constitutional: Negative for fever and chills.  Respiratory: Negative for shortness of breath.   Cardiovascular: Negative for chest pain.  Gastrointestinal: Positive for nausea. Negative for vomiting and abdominal pain.  Genitourinary: Negative for dysuria  and hematuria.  Musculoskeletal: Positive for back pain.  Neurological: Positive for headaches. Negative for weakness and numbness.  All other systems reviewed and are negative.     Allergies  Caffeine; Ciprofloxacin; Penicillins; Sulfamethoxazole-trimethoprim; Tramadol; Acyclovir and related; Haldol; Aloprim; Ambien; Bactericin; Bactrim; Other; Pneumovax; Prevnar; Watermelon flavor; and Zoloft  Home Medications   Prior to Admission medications   Medication Sig Start Date End Date Taking? Authorizing Provider  ALPRAZolam Prudy Feeler) 1 MG tablet Take 1 mg by mouth.    Historical Provider, MD  chlordiazePOXIDE (LIBRIUM) 25 MG capsule Take 25 mg by mouth 3 (three) times daily as needed for anxiety.    Historical Provider, MD  clonazePAM (KLONOPIN) 1 MG tablet Take 0.5 tablets (0.5 mg total) by mouth 2 (two) times daily as needed for anxiety (or muscle spasms). 12/04/14   John Molpus, MD  cloNIDine-chlorthalidone (CLORPRES) 0.1-15 MG per tablet Take 1 tablet by mouth.    Historical Provider, MD  emtricitabine-tenofovir (TRUVADA) 200-300 MG per tablet Take 1 tablet by mouth daily. 10/31/14   Cliffton Asters, MD  HYDROcodone-acetaminophen (NORCO/VICODIN) 5-325 MG per tablet Take 1-2 tablets by mouth every 6 (six) hours as needed (for pain). 12/04/14   John Molpus, MD  levETIRAcetam (KEPPRA) 500 MG tablet Take 1 tablet (500 mg total) by mouth 2 (two) times daily. 08/21/14   Jennifer Piepenbrink, PA-C  lisinopril (PRINIVIL,ZESTRIL) 20 MG tablet Take 1 tablet (20 mg total) by mouth daily. 07/31/14   Jonny Ruiz  Orvan Falconer, MD  metoCLOPramide (REGLAN) 10 MG tablet Take 1 tablet (10 mg total) by mouth every 6 (six) hours as needed for nausea (or headache). 12/04/14   John Molpus, MD  omeprazole (PRILOSEC OTC) 20 MG tablet Take 20 mg by mouth daily.    Historical Provider, MD  ondansetron (ZOFRAN ODT) 8 MG disintegrating tablet Take 1 tablet (8 mg total) by mouth every 8 (eight) hours as needed for nausea or vomiting. 12/04/14    John Molpus, MD  phenytoin (DILANTIN) 100 MG ER capsule Take 1 capsule (100 mg total) by mouth 3 (three) times daily. 08/21/14   Jennifer Piepenbrink, PA-C  polyethylene glycol (MIRALAX / GLYCOLAX) packet Take 17 g by mouth daily. 09/06/14   Arby Barrette, MD  raltegravir (ISENTRESS) 400 MG tablet Take 1 tablet (400 mg total) by mouth 2 (two) times daily. 10/31/14   Cliffton Asters, MD  temazepam (RESTORIL) 30 MG capsule Take 1 capsule (30 mg total) by mouth at bedtime as needed for sleep. 11/19/14   Ginnie Smart, MD  valACYclovir (VALTREX) 1000 MG tablet Take 1 tablet (1,000 mg total) by mouth 2 (two) times daily. 10/23/14   Ginnie Smart, MD   BP 118/83 mmHg  Pulse 79  Temp(Src) 98.2 F (36.8 C) (Oral)  Resp 16  Ht  (1.626 m)  Wt 145 lb (65.772 kg)  BMI 24.88 kg/m2  SpO2 100% Physical Exam  Constitutional: He is oriented to person, place, and time. He appears well-developed and well-nourished.  HENT:  Head: Normocephalic and atraumatic.  Right Ear: Tympanic membrane and external ear normal.  Left Ear: Tympanic membrane and external ear normal. No swelling or tenderness.  Nose: Nose normal.  No tenderness over head/scalp. No mastoid tenderness Mild erythema in left ear canal  Eyes: EOM are normal. Pupils are equal, round, and reactive to light. Right eye exhibits no discharge. Left eye exhibits no discharge.  Fundoscopic exam:      The right eye shows no papilledema.  Neck: Normal range of motion. Neck supple.  No meningismus  Cardiovascular: Normal rate, regular rhythm, normal heart sounds and intact distal pulses.   Pulmonary/Chest: Effort normal and breath sounds normal.  Abdominal: Soft. He exhibits no distension. There is no tenderness.  Musculoskeletal: He exhibits no edema.  Neurological: He is alert and oriented to person, place, and time. He displays no Babinski's sign on the right side. He displays no Babinski's sign on the left side.  Reflex Scores:      Bicep  reflexes are 2+ on the right side and 2+ on the left side.      Patellar reflexes are 2+ on the right side and 2+ on the left side.      Achilles reflexes are 2+ on the right side and 2+ on the left side. CN 2-12 grossly intact. 5/5 strength in all 4 extremities.  Skin: Skin is warm and dry.  Nursing note and vitals reviewed.   ED Course  Procedures (including critical care time) Labs Review Labs Reviewed  COMPREHENSIVE METABOLIC PANEL - Abnormal; Notable for the following:    Glucose, Bld 105 (*)    AST 99 (*)    ALT 176 (*)    All other components within normal limits  CBC WITH DIFFERENTIAL/PLATELET  URINALYSIS, ROUTINE W REFLEX MICROSCOPIC    Imaging Review Ct Head W Wo Contrast  12/13/2014   CLINICAL DATA:  34 year old male with left side headache for 2 weeks and memory loss. Initial encounter. Current  history of HIV  EXAM: CT HEAD WITHOUT AND WITH CONTRAST  TECHNIQUE: Contiguous axial images were obtained from the base of the skull through the vertex without and with intravenous contrast  CONTRAST:  80mL OMNIPAQUE IOHEXOL 300 MG/ML  SOLN  COMPARISON:  Head CT without contrast 08/21/2014.  FINDINGS: Stable mild ethmoid sinus mucosal thickening. Other Visualized paranasal sinuses and mastoids are clear. No acute osseous abnormality identified.  Visualized orbit soft tissues are within normal limits. Visualized scalp soft tissues are within normal limits.  Cerebral volume is normal. No midline shift, ventriculomegaly, mass effect, evidence of mass lesion, intracranial hemorrhage or evidence of cortically based acute infarction. Gray-white matter differentiation is within normal limits throughout the brain. No abnormal enhancement identified. Major intracranial vascular structures R enhancing.  IMPRESSION: Stable and normal CT appearance of the brain.   Electronically Signed   By: Odessa FlemingH  Hall M.D.   On: 12/13/2014 11:50     EKG Interpretation None      MDM   Final diagnoses:  Acute  nonintractable headache, unspecified headache type  Bilateral low back pain without sciatica    Patient has well-controlled HIV with a subacute headache and back pain. No focal abnormalities present on exam. Did have recent otitis externa but has no mastoid tenderness and benign CT with and without contrast. Patient's pain somewhat better, given a headache cocktail for more persistent headache. Does not have any fevers or chills and I have very low suspicion for an infectious process at this time, especially given his high T cell count. Labwork unremarkable except for mild LFT elevation. I discussed with ID, Dr. Ilsa IhaSnyder, who recommends stopping any sort of Tylenol, including the Vicodin use recently given, but continuing his HIV medicines. They will arrange for repeat blood draw next week. I discussed this with the patient who verbalized understanding of all follow-up as needed with them for his headache.    Pricilla LovelessScott Aneesh Faller, MD 12/13/14 [phone]

## 2014-12-13 NOTE — ED Notes (Signed)
Pt returned from CT °

## 2014-12-13 NOTE — ED Notes (Signed)
MD at bedside discussing results with patient and family. 

## 2014-12-13 NOTE — ED Notes (Signed)
Reports left lower  Back pain. Reports some nausea and headache. Sts he was here last week and isn't getting better.

## 2014-12-13 NOTE — Discharge Instructions (Signed)
Your liver function tests are elevated above your most recent testing. At this point you should stop aching any form of Tylenol, including the Vicodin you were given earlier. You will need to follow-up with your infectious disease doctor on Monday for a lab draw. Call them if your headache and back pain are still persisting for follow-up and evaluation of this as well. If any symptoms change or worsen come back to the ER for evaluation.

## 2014-12-13 NOTE — ED Notes (Signed)
MD at bedside. 

## 2014-12-16 ENCOUNTER — Emergency Department (HOSPITAL_BASED_OUTPATIENT_CLINIC_OR_DEPARTMENT_OTHER)
Admission: EM | Admit: 2014-12-16 | Discharge: 2014-12-17 | Disposition: A | Discharge status: Home / Self Care | Payer: Self-pay | Attending: Emergency Medicine | Admitting: Emergency Medicine

## 2014-12-16 ENCOUNTER — Encounter (HOSPITAL_BASED_OUTPATIENT_CLINIC_OR_DEPARTMENT_OTHER): Payer: Self-pay | Admitting: Emergency Medicine

## 2014-12-16 ENCOUNTER — Emergency Department (HOSPITAL_BASED_OUTPATIENT_CLINIC_OR_DEPARTMENT_OTHER): Payer: Self-pay

## 2014-12-16 DIAGNOSIS — G40909 Epilepsy, unspecified, not intractable, without status epilepticus: Secondary | ICD-10-CM | POA: Insufficient documentation

## 2014-12-16 DIAGNOSIS — I10 Essential (primary) hypertension: Secondary | ICD-10-CM | POA: Insufficient documentation

## 2014-12-16 DIAGNOSIS — Z880 Allergy status to penicillin: Secondary | ICD-10-CM | POA: Insufficient documentation

## 2014-12-16 DIAGNOSIS — F419 Anxiety disorder, unspecified: Secondary | ICD-10-CM | POA: Insufficient documentation

## 2014-12-16 DIAGNOSIS — Z720 Tobacco use: Secondary | ICD-10-CM | POA: Insufficient documentation

## 2014-12-16 DIAGNOSIS — R51 Headache: Secondary | ICD-10-CM | POA: Insufficient documentation

## 2014-12-16 DIAGNOSIS — Z79899 Other long term (current) drug therapy: Secondary | ICD-10-CM | POA: Insufficient documentation

## 2014-12-16 DIAGNOSIS — R519 Headache, unspecified: Secondary | ICD-10-CM

## 2014-12-16 DIAGNOSIS — B20 Human immunodeficiency virus [HIV] disease: Secondary | ICD-10-CM | POA: Insufficient documentation

## 2014-12-16 DIAGNOSIS — Z8669 Personal history of other diseases of the nervous system and sense organs: Secondary | ICD-10-CM | POA: Insufficient documentation

## 2014-12-16 LAB — CBC WITH DIFFERENTIAL/PLATELET
BASOS PCT: 0 % (ref 0–1)
Basophils Absolute: 0 10*3/uL (ref 0.0–0.1)
EOS PCT: 0 % (ref 0–5)
Eosinophils Absolute: 0 10*3/uL (ref 0.0–0.7)
HCT: 45.5 % (ref 39.0–52.0)
Hemoglobin: 15.8 g/dL (ref 13.0–17.0)
LYMPHS PCT: 37 % (ref 12–46)
Lymphs Abs: 2.5 10*3/uL (ref 0.7–4.0)
MCH: 32.1 pg (ref 26.0–34.0)
MCHC: 34.7 g/dL (ref 30.0–36.0)
MCV: 92.5 fL (ref 78.0–100.0)
MONOS PCT: 9 % (ref 3–12)
Monocytes Absolute: 0.6 10*3/uL (ref 0.1–1.0)
NEUTROS ABS: 3.7 10*3/uL (ref 1.7–7.7)
Neutrophils Relative %: 54 % (ref 43–77)
PLATELETS: 244 10*3/uL (ref 150–400)
RBC: 4.92 MIL/uL (ref 4.22–5.81)
RDW: 13 % (ref 11.5–15.5)
WBC: 6.8 10*3/uL (ref 4.0–10.5)

## 2014-12-16 LAB — BASIC METABOLIC PANEL
Anion gap: 9 (ref 5–15)
BUN: 11 mg/dL (ref 6–23)
CO2: 24 mmol/L (ref 19–32)
Calcium: 9.1 mg/dL (ref 8.4–10.5)
Chloride: 104 mmol/L (ref 96–112)
Creatinine, Ser: 0.88 mg/dL (ref 0.50–1.35)
GFR calc Af Amer: >90 mL/min (ref >90)
Glucose, Bld: 100 mg/dL — ABNORMAL HIGH (ref 70–99)
Potassium: 3.9 mmol/L (ref 3.5–5.1)
Sodium: 137 mmol/L (ref 135–145)

## 2014-12-16 LAB — OCCULT BLOOD X 1 CARD TO LAB, STOOL: Fecal Occult Bld: POSITIVE — AB

## 2014-12-16 MED ORDER — DIPHENHYDRAMINE HCL 50 MG/ML IJ SOLN
25.0000 mg | Freq: Once | INTRAMUSCULAR | Status: AC
  Administered 2014-12-17: 25 mg via INTRAVENOUS
  Filled 2014-12-16: qty 1

## 2014-12-16 MED ORDER — METOCLOPRAMIDE HCL 5 MG/ML IJ SOLN
10.0000 mg | Freq: Once | INTRAMUSCULAR | Status: AC
  Administered 2014-12-17: 10 mg via INTRAVENOUS
  Filled 2014-12-16: qty 2

## 2014-12-16 MED ORDER — SODIUM CHLORIDE 0.9 % IV BOLUS (SEPSIS)
1000.0000 mL | Freq: Once | INTRAVENOUS | Status: AC
  Administered 2014-12-16: 1000 mL via INTRAVENOUS

## 2014-12-16 MED ORDER — ONDANSETRON 8 MG PO TBDP
8.0000 mg | ORAL_TABLET | Freq: Once | ORAL | Status: AC
  Administered 2014-12-16: 8 mg via ORAL
  Filled 2014-12-16: qty 1

## 2014-12-16 MED ORDER — KETOROLAC TROMETHAMINE 60 MG/2ML IM SOLN
60.0000 mg | Freq: Once | INTRAMUSCULAR | Status: AC
  Administered 2014-12-17: 60 mg via INTRAMUSCULAR
  Filled 2014-12-16: qty 2

## 2014-12-16 NOTE — ED Notes (Signed)
34 yo male with migraine for 2 weeks. Pain left side of head radiates to the posterior. N/V/D. Denies fever. Seen here on the 3/17 Rx  Roxicet with no relief.

## 2014-12-16 NOTE — ED Provider Notes (Signed)
CSN: [id number]     Arrival date & time 12/16/14  2001 History   None    Chief Complaint  Patient presents with  . Migraine   HPI   34 year old patient presents with 2 weeks of persistent headache. Patient reports that approximately 2 weeks ago he experienced headache, ear pain, back pain. He was seen in the emergency room for the above and treated with antibiotics for the otitis externa, pain medication for the back pain. In his back pain and ear pain but continued to experience headaches. He was again seen for the headaches and treated in the emergency room with some relief. headache soon returned after treatment. Patient reports that the headache is unilateral on the left side located behind his eye. Scribes the pain is throbbing with no radiation. She reports blurred vision, photophobia, phobia, nausea, vomiting. He denies loss of sensation or motor function. Patient notes that he is HIV positive but as her recently been seen by his infectious disease doctor with his T-cell count over 1000. Patient has history of seizures noting one seizure in the last 2 weeks which is not abnormal for him. Currently taking Keppra for seizures. She denies neck pain, fevers, chest pain shortness of breath, abdominal pain. He does note dark stools, no rectal bleeding. As noted history of peptic ulcer disease treated last year, but denies any epigastric pain.  Past Medical History  Diagnosis Date  . Hypertension   . Anxiety epi  . HIV (human immunodeficiency virus infection)   . Seizure     last seizure 09/05/2014  . Epilepsy    Past Surgical History  Procedure Laterality Date  . Orif clavicular fracture     Family History  Problem Relation Age of Onset  . Adopted: Yes   History  Substance Use Topics  . Smoking status: Current Every Day Smoker -- 0.10 packs/day    Types: Cigarettes  . Smokeless tobacco: Never Used     Comment: cutting back  . Alcohol Use: No    Review of Systems  All other  systems reviewed and are negative.  Allergies  Caffeine; Ciprofloxacin; Penicillins; Sulfamethoxazole-trimethoprim; Tramadol; Acyclovir and related; Haldol; Aloprim; Ambien; Bactericin; Bactrim; Other; Pneumovax; Prevnar; Watermelon flavor; and Zoloft  Home Medications   Prior to Admission medications   Medication Sig Start Date End Date Taking? Authorizing Provider  ALPRAZolam Prudy Feeler(XANAX) 1 MG tablet Take 1 mg by mouth.    Historical Provider, MD  chlordiazePOXIDE (LIBRIUM) 25 MG capsule Take 25 mg by mouth 3 (three) times daily as needed for anxiety.    Historical Provider, MD  clonazePAM (KLONOPIN) 1 MG tablet Take 0.5 tablets (0.5 mg total) by mouth 2 (two) times daily as needed for anxiety (or muscle spasms). 12/04/14   John Molpus, MD  cloNIDine-chlorthalidone (CLORPRES) 0.1-15 MG per tablet Take 1 tablet by mouth.    Historical Provider, MD  emtricitabine-tenofovir (TRUVADA) 200-300 MG per tablet Take 1 tablet by mouth daily. 10/31/14   Cliffton AstersJohn Campbell, MD  levETIRAcetam (KEPPRA) 500 MG tablet Take 1 tablet (500 mg total) by mouth 2 (two) times daily. 08/21/14   Jennifer Piepenbrink, PA-C  lisinopril (PRINIVIL,ZESTRIL) 20 MG tablet Take 1 tablet (20 mg total) by mouth daily. 07/31/14   Cliffton AstersJohn Campbell, MD  metoCLOPramide (REGLAN) 10 MG tablet Take 1 tablet (10 mg total) by mouth every 6 (six) hours as needed for nausea (or headache). 12/04/14   John Molpus, MD  omeprazole (PRILOSEC OTC) 20 MG tablet Take 20 mg by mouth daily.  Historical Provider, MD  ondansetron (ZOFRAN ODT) 8 MG disintegrating tablet Take 1 tablet (8 mg total) by mouth every 8 (eight) hours as needed for nausea or vomiting. 12/04/14   John Molpus, MD  oxyCODONE (ROXICODONE) 5 MG immediate release tablet Take 1 tablet (5 mg total) by mouth every 6 (six) hours as needed for severe pain. 12/13/14   Pricilla Loveless, MD  phenytoin (DILANTIN) 100 MG ER capsule Take 1 capsule (100 mg total) by mouth 3 (three) times daily. 08/21/14   Jennifer  Piepenbrink, PA-C  polyethylene glycol (MIRALAX / GLYCOLAX) packet Take 17 g by mouth daily. 09/06/14   Arby Barrette, MD  raltegravir (ISENTRESS) 400 MG tablet Take 1 tablet (400 mg total) by mouth 2 (two) times daily. 10/31/14   Cliffton Asters, MD  temazepam (RESTORIL) 30 MG capsule Take 1 capsule (30 mg total) by mouth at bedtime as needed for sleep. 11/19/14   Ginnie Smart, MD  valACYclovir (VALTREX) 1000 MG tablet Take 1 tablet (1,000 mg total) by mouth 2 (two) times daily. 10/23/14   Ginnie Smart, MD   BP 122/81 mmHg  Pulse 68  Temp(Src) 98.8 F (37.1 C) (Oral)  Resp 18  Ht  (1.626 m)  Wt 145 lb (65.772 kg)  BMI 24.88 kg/m2  SpO2 100% Physical Exam  Constitutional: He is oriented to person, place, and time. He appears well-developed and well-nourished.  HENT:  Head: Normocephalic and atraumatic.  Eyes: Pupils are equal, round, and reactive to light.  Neck: Normal range of motion. Neck supple. No JVD present. No tracheal deviation present. No thyromegaly present.  Cardiovascular: Normal rate, regular rhythm, normal heart sounds and intact distal pulses.  Exam reveals no gallop and no friction rub.   No murmur heard. Pulmonary/Chest: Effort normal and breath sounds normal. No stridor. No respiratory distress. He has no wheezes. He has no rales. He exhibits no tenderness.  Abdominal: Soft. Bowel sounds are normal. He exhibits no distension. There is no tenderness. There is no rebound and no guarding.  Musculoskeletal: Normal range of motion.  Lymphadenopathy:    He has no cervical adenopathy.  Neurological: He is alert and oriented to person, place, and time. No cranial nerve deficit or sensory deficit. Coordination normal. GCS eye subscore is 4. GCS verbal subscore is 5. GCS motor subscore is 6.  Skin: Skin is warm and dry.  Psychiatric: He has a normal mood and affect. His behavior is normal. Judgment and thought content normal.  Nursing note and vitals  reviewed.   ED Course  Procedures (including critical care time) Labs Review Labs Reviewed  OCCULT BLOOD X 1 CARD TO LAB, STOOL - Abnormal; Notable for the following:    Fecal Occult Bld POSITIVE (*)    All other components within normal limits  BASIC METABOLIC PANEL - Abnormal; Notable for the following:    Glucose, Bld 100 (*)    All other components within normal limits  CBC WITH DIFFERENTIAL/PLATELET    Imaging Review Ct Head Wo Contrast  12/16/2014   CLINICAL DATA:  Initial evaluation for acute double vision, pain at left thigh region.  EXAM: CT HEAD WITHOUT CONTRAST  TECHNIQUE: Contiguous axial images were obtained from the base of the skull through the vertex without intravenous contrast.  COMPARISON:  Prior study from 12/13/2014  FINDINGS: There is no acute intracranial hemorrhage or infarct. No mass lesion or midline shift. Gray-white matter differentiation is well maintained. Ventricles are normal in size without evidence of hydrocephalus. CSF  containing spaces are within normal limits. No extra-axial fluid collection.  The calvarium is intact.  Orbital soft tissues are within normal limits.  The paranasal sinuses and mastoid air cells are well pneumatized and free of fluid.  Scalp soft tissues are unremarkable.  IMPRESSION: Normal head CT with no acute intracranial process identified.   Electronically Signed   By: Rise Mu M.D.   On: 12/16/2014 22:58     EKG Interpretation None     MDM   Final diagnoses:  Headache    Labs: CBC, BMP normal occult blood positive  Imaging:CT head without contrast shows normal head CT with no acute intracranial process at bedside  Therapeutics:  Benadryl, Reglan, Toradol, NaCl, Zofran  Assessment/Plan: patient presented with chief complaint of headaches headaches been happening for the past 2 weeks. He has been seen here and treated twice previously. His presentation characteristics of a migraine headache but due to its  duration it would be atypical. Patient was given Benadryl Reglan and Toradol and found great improvement in his pain. Patient continued to deny neurological symptoms, fever, neck pain. ET scan was negative. Presentation, improvement, lab results make further emergency evaluation unnecessary at this time. Patient also noted dark stools and had a positive occult blood. His vital signs are stable, CBC was normal, and had no abdominal or rectal complaints. He does have a history of gastric ulcer denies symptoms. This is unlikely an unstable condition and can safely be that up by GI. Patient was explained the results and the need for continued management and follow-up for his health conditions. Patient notes that he has an appointment with his infectious disease physician tomorrow and was planning to follow-up with him. He was instructed to also establish care with a neurologist, and GI specialist for further evaluation. Patient was instructed to return immediately to the emergency room if headache returned with worsening symptoms. Patient understood and agreed to the plan.       Eyvonne Mechanic, PA-C 12/18/14 1610  Geoffery Lyons, MD 12/19/14 [phone]

## 2014-12-16 NOTE — ED Notes (Signed)
"  Has an appt with Dr. Ninetta LightsHatcher tomorrow, but pain was too severe". "This is third visit in 10d".

## 2014-12-17 MED ORDER — ONDANSETRON HCL 4 MG PO TABS: 4.0000 mg | ORAL_TABLET | Freq: Three times a day (TID) | ORAL | Status: DC | PRN

## 2014-12-17 NOTE — Discharge Instructions (Signed)
Please use Zofran, Benadryl, Tylenol as needed for headache symptoms. Please follow-up with your infectious diseases doctor tomorrow as previously scheduled. Please contact her primary care physician and informed him of your hospital visit, and follow-up with neurology and gastroenterology. If her pain worsens or concerning signs or symptoms present please return to the emergency room for further evaluation.

## 2014-12-18 ENCOUNTER — Ambulatory Visit: Payer: Self-pay

## 2014-12-26 ENCOUNTER — Telehealth: Payer: Self-pay | Admitting: *Deleted

## 2014-12-26 NOTE — Telephone Encounter (Signed)
Called the patient per Dr Drue SecondSnider to have him come in for labs around 01/17/15 and the patient advised he is not able to do so as he has an appt set for 05/20/15 with Dr Ninetta LightsHatcher and does not see why he would need to come. Advised the patient because he is new to meds and his Dx we need to follow him closely and he was very sick recently. He advised he will call back if he starts to feel bad.

## 2015-01-05 ENCOUNTER — Emergency Department (HOSPITAL_BASED_OUTPATIENT_CLINIC_OR_DEPARTMENT_OTHER)
Admission: EM | Admit: 2015-01-05 | Discharge: 2015-01-05 | Disposition: A | Discharge status: Home / Self Care | Payer: Self-pay | Attending: Emergency Medicine | Admitting: Emergency Medicine

## 2015-01-05 ENCOUNTER — Encounter (HOSPITAL_BASED_OUTPATIENT_CLINIC_OR_DEPARTMENT_OTHER): Payer: Self-pay | Admitting: *Deleted

## 2015-01-05 DIAGNOSIS — G40909 Epilepsy, unspecified, not intractable, without status epilepticus: Secondary | ICD-10-CM | POA: Insufficient documentation

## 2015-01-05 DIAGNOSIS — G43909 Migraine, unspecified, not intractable, without status migrainosus: Secondary | ICD-10-CM | POA: Insufficient documentation

## 2015-01-05 DIAGNOSIS — Z880 Allergy status to penicillin: Secondary | ICD-10-CM | POA: Insufficient documentation

## 2015-01-05 DIAGNOSIS — F419 Anxiety disorder, unspecified: Secondary | ICD-10-CM | POA: Insufficient documentation

## 2015-01-05 DIAGNOSIS — R1032 Left lower quadrant pain: Secondary | ICD-10-CM | POA: Insufficient documentation

## 2015-01-05 DIAGNOSIS — Z8619 Personal history of other infectious and parasitic diseases: Secondary | ICD-10-CM | POA: Insufficient documentation

## 2015-01-05 DIAGNOSIS — Z79899 Other long term (current) drug therapy: Secondary | ICD-10-CM | POA: Insufficient documentation

## 2015-01-05 DIAGNOSIS — Z720 Tobacco use: Secondary | ICD-10-CM | POA: Insufficient documentation

## 2015-01-05 DIAGNOSIS — I10 Essential (primary) hypertension: Secondary | ICD-10-CM | POA: Insufficient documentation

## 2015-01-05 HISTORY — DX: Migraine, unspecified, not intractable, without status migrainosus: G43.909

## 2015-01-05 LAB — CBC WITH DIFFERENTIAL/PLATELET
Basophils Absolute: 0 10*3/uL (ref 0.0–0.1)
Basophils Relative: 0 % (ref 0–1)
Eosinophils Absolute: 0 10*3/uL (ref 0.0–0.7)
Eosinophils Relative: 1 % (ref 0–5)
HCT: 43.5 % (ref 39.0–52.0)
Hemoglobin: 14.8 g/dL (ref 13.0–17.0)
LYMPHS PCT: 39 % (ref 12–46)
Lymphs Abs: 2 10*3/uL (ref 0.7–4.0)
MCH: 31.8 pg (ref 26.0–34.0)
MCHC: 34 g/dL (ref 30.0–36.0)
MCV: 93.3 fL (ref 78.0–100.0)
MONO ABS: 0.4 10*3/uL (ref 0.1–1.0)
MONOS PCT: 7 % (ref 3–12)
NEUTROS ABS: 2.6 10*3/uL (ref 1.7–7.7)
Neutrophils Relative %: 53 % (ref 43–77)
Platelets: 217 10*3/uL (ref 150–400)
RBC: 4.66 MIL/uL (ref 4.22–5.81)
RDW: 12.7 % (ref 11.5–15.5)
WBC: 5 10*3/uL (ref 4.0–10.5)

## 2015-01-05 LAB — OCCULT BLOOD X 1 CARD TO LAB, STOOL: Fecal Occult Bld: NEGATIVE

## 2015-01-05 LAB — COMPREHENSIVE METABOLIC PANEL
ALT: 36 U/L (ref 0–53)
AST: 19 U/L (ref 0–37)
Albumin: 4.6 g/dL (ref 3.5–5.2)
Alkaline Phosphatase: 89 U/L (ref 39–117)
Anion gap: 6 (ref 5–15)
BUN: 11 mg/dL (ref 6–23)
CO2: 25 mmol/L (ref 19–32)
Calcium: 9 mg/dL (ref 8.4–10.5)
Chloride: 107 mmol/L (ref 96–112)
Creatinine, Ser: 0.85 mg/dL (ref 0.50–1.35)
GFR calc non Af Amer: >90 mL/min (ref >90)
GLUCOSE: 97 mg/dL (ref 70–99)
POTASSIUM: 3.8 mmol/L (ref 3.5–5.1)
Sodium: 138 mmol/L (ref 135–145)
Total Bilirubin: 0.2 mg/dL — ABNORMAL LOW (ref 0.3–1.2)
Total Protein: 7.3 g/dL (ref 6.0–8.3)

## 2015-01-05 LAB — LIPASE, BLOOD: LIPASE: 28 U/L (ref 11–59)

## 2015-01-05 MED ORDER — ONDANSETRON HCL 4 MG/2ML IJ SOLN
4.0000 mg | Freq: Once | INTRAMUSCULAR | Status: AC
  Administered 2015-01-05: 4 mg via INTRAVENOUS
  Filled 2015-01-05: qty 2

## 2015-01-05 MED ORDER — CLONAZEPAM 1 MG PO TABS: 1.0000 mg | ORAL_TABLET | Freq: Two times a day (BID) | ORAL | Status: DC | PRN

## 2015-01-05 MED ORDER — MORPHINE SULFATE 4 MG/ML IJ SOLN
4.0000 mg | Freq: Once | INTRAMUSCULAR | Status: AC
  Administered 2015-01-05: 4 mg via INTRAVENOUS
  Filled 2015-01-05: qty 1

## 2015-01-05 MED ORDER — PROMETHAZINE HCL 25 MG PO TABS: 25.0000 mg | ORAL_TABLET | Freq: Four times a day (QID) | ORAL | Status: DC | PRN

## 2015-01-05 MED ORDER — CLONAZEPAM 1 MG PO TABS
1.0000 mg | ORAL_TABLET | Freq: Two times a day (BID) | ORAL | Status: DC | PRN
Start: 2015-01-05 — End: 2015-01-05

## 2015-01-05 MED ORDER — SODIUM CHLORIDE 0.9 % IV BOLUS (SEPSIS)
1000.0000 mL | Freq: Once | INTRAVENOUS | Status: AC
  Administered 2015-01-05: 1000 mL via INTRAVENOUS

## 2015-01-05 MED ORDER — LORAZEPAM 2 MG/ML IJ SOLN
1.0000 mg | Freq: Once | INTRAMUSCULAR | Status: AC
  Administered 2015-01-05: 1 mg via INTRAVENOUS
  Filled 2015-01-05: qty 1

## 2015-01-05 MED ORDER — METOCLOPRAMIDE HCL 10 MG PO TABS: 10.0000 mg | ORAL_TABLET | Freq: Four times a day (QID) | ORAL | Status: DC

## 2015-01-05 NOTE — ED Provider Notes (Signed)
CSN: [id number]     Arrival date & time 01/05/15  3086 History   First MD Initiated Contact with Patient 01/05/15 1015     Chief Complaint  Patient presents with  . Emesis  . Anxiety     (Consider location/radiation/quality/duration/timing/severity/associated sxs/prior Treatment) HPI Comments: Patient presents with anxiety and vomiting. He states he has a history of anxiety and is been out of his Klonopin for about a week. He feels like his anxiety is acting up terribly. He's having nausea vomiting and diarrhea which she attributes to the anxiety. He's having watery diarrhea but also some black stools at times. He's had a history of black stools in the recent past with some blood in his stool and has an upcoming appointment with gastroenterology. His emesis is nonbilious and nonbloody. He denies any fevers. He's had some crampy pain to his left side of his abdomen which she's had in the past. He denies any urinary symptoms. He has history of HIV and states he is compliant with his medications and has regular follow-up with his infectious disease physician.  Patient is a 34 y.o. male presenting with vomiting and anxiety.  Emesis Associated symptoms: abdominal pain and diarrhea   Associated symptoms: no arthralgias, no chills and no headaches   Anxiety Associated symptoms include abdominal pain. Pertinent negatives include no chest pain, no headaches and no shortness of breath.    Past Medical History  Diagnosis Date  . Hypertension   . Anxiety epi  . HIV (human immunodeficiency virus infection)   . Seizure     last seizure 09/05/2014  . Epilepsy   . Migraines 2016   Past Surgical History  Procedure Laterality Date  . Orif clavicular fracture     Family History  Problem Relation Age of Onset  . Adopted: Yes   History  Substance Use Topics  . Smoking status: Current Every Day Smoker -- 0.10 packs/day    Types: Cigarettes  . Smokeless tobacco: Never Used     Comment: cutting  back  . Alcohol Use: No    Review of Systems  Constitutional: Negative for fever, chills, diaphoresis and fatigue.  HENT: Negative for congestion, rhinorrhea and sneezing.   Eyes: Negative.   Respiratory: Negative for cough, chest tightness and shortness of breath.   Cardiovascular: Negative for chest pain and leg swelling.  Gastrointestinal: Positive for nausea, vomiting, abdominal pain, diarrhea and blood in stool.  Genitourinary: Negative for frequency, hematuria, flank pain and difficulty urinating.  Musculoskeletal: Negative for back pain and arthralgias.  Skin: Negative for rash.  Neurological: Negative for dizziness, speech difficulty, weakness, numbness and headaches.  Psychiatric/Behavioral: The patient is nervous/anxious.       Allergies  Caffeine; Ciprofloxacin; Penicillins; Sulfamethoxazole-trimethoprim; Tramadol; Acyclovir and related; Haldol; Librium; Aloprim; Ambien; Bactericin; Bactrim; Other; Pneumovax; Prevnar; Watermelon flavor; and Zoloft  Home Medications   Prior to Admission medications   Medication Sig Start Date End Date Taking? Authorizing Provider  anti-nausea (EMETROL) solution Take 10 mLs by mouth every 15 (fifteen) minutes as needed for nausea or vomiting.   Yes Historical Provider, MD  bismuth subsalicylate (PEPTO BISMOL) 262 MG/15ML suspension Take 30 mLs by mouth every 6 (six) hours as needed.   Yes Historical Provider, MD  emtricitabine-tenofovir (TRUVADA) 200-300 MG per tablet Take 1 tablet by mouth daily. 10/31/14  Yes Cliffton Asters, MD  levETIRAcetam (KEPPRA) 500 MG tablet Take 1 tablet (500 mg total) by mouth 2 (two) times daily. 08/21/14  Yes Jennifer Piepenbrink, PA-C  lisinopril (  PRINIVIL,ZESTRIL) 20 MG tablet Take 1 tablet (20 mg total) by mouth daily. 07/31/14  Yes Cliffton AstersJohn Campbell, MD  omeprazole (PRILOSEC OTC) 20 MG tablet Take 20 mg by mouth daily.   Yes Historical Provider, MD  phenytoin (DILANTIN) 100 MG ER capsule Take 1 capsule (100 mg  total) by mouth 3 (three) times daily. 08/21/14  Yes Jennifer Piepenbrink, PA-C  raltegravir (ISENTRESS) 400 MG tablet Take 1 tablet (400 mg total) by mouth 2 (two) times daily. 10/31/14  Yes Cliffton AstersJohn Campbell, MD  temazepam (RESTORIL) 30 MG capsule Take 1 capsule (30 mg total) by mouth at bedtime as needed for sleep. 11/19/14  Yes Ginnie SmartJeffrey C Hatcher, MD  ALPRAZolam Prudy Feeler(XANAX) 1 MG tablet Take 1 mg by mouth.    Historical Provider, MD  chlordiazePOXIDE (LIBRIUM) 25 MG capsule Take 25 mg by mouth 3 (three) times daily as needed for anxiety.    Historical Provider, MD  clonazePAM (KLONOPIN) 1 MG tablet Take 1 tablet (1 mg total) by mouth 2 (two) times daily as needed for anxiety. 01/05/15   Rolan BuccoMelanie Phebe Dettmer, MD  cloNIDine-chlorthalidone (CLORPRES) 0.1-15 MG per tablet Take 1 tablet by mouth.    Historical Provider, MD  metoCLOPramide (REGLAN) 10 MG tablet Take 1 tablet (10 mg total) by mouth every 6 (six) hours. 01/05/15   Rolan BuccoMelanie Avianah Pellman, MD  ondansetron (ZOFRAN ODT) 8 MG disintegrating tablet Take 1 tablet (8 mg total) by mouth every 8 (eight) hours as needed for nausea or vomiting. 12/04/14   John Molpus, MD  oxyCODONE (ROXICODONE) 5 MG immediate release tablet Take 1 tablet (5 mg total) by mouth every 6 (six) hours as needed for severe pain. 12/13/14   Pricilla LovelessScott Goldston, MD  polyethylene glycol (MIRALAX / GLYCOLAX) packet Take 17 g by mouth daily. 09/06/14   Arby BarretteMarcy Pfeiffer, MD  valACYclovir (VALTREX) 1000 MG tablet Take 1 tablet (1,000 mg total) by mouth 2 (two) times daily. 10/23/14   Ginnie SmartJeffrey C Hatcher, MD   BP 135/80 mmHg  Pulse 70  Temp(Src) 98.3 F (36.8 C) (Oral)  Resp 16  Ht 5\' 4"  (1.626 m)  Wt 145 lb (65.772 kg)  BMI 24.88 kg/m2  SpO2 100% Physical Exam  Constitutional: He is oriented to person, place, and time. He appears well-developed and well-nourished.  HENT:  Head: Normocephalic and atraumatic.  Eyes: Pupils are equal, round, and reactive to light.  Neck: Normal range of motion. Neck supple.   Cardiovascular: Normal rate, regular rhythm and normal heart sounds.   Pulmonary/Chest: Effort normal and breath sounds normal. No respiratory distress. He has no wheezes. He has no rales. He exhibits no tenderness.  Abdominal: Soft. Bowel sounds are normal. There is tenderness (mild tenderness across the left side of the abdomen). There is no rebound and no guarding.  Musculoskeletal: Normal range of motion. He exhibits no edema.  Lymphadenopathy:    He has no cervical adenopathy.  Neurological: He is alert and oriented to person, place, and time.  Skin: Skin is warm and dry. No rash noted.  Psychiatric: He has a normal mood and affect.    ED Course  Procedures (including critical care time) Labs Review Results for orders placed or performed during the hospital encounter of 01/05/15  Comprehensive metabolic panel  Result Value Ref Range   Sodium 138 135 - 145 mmol/L   Potassium 3.8 3.5 - 5.1 mmol/L   Chloride 107 96 - 112 mmol/L   CO2 25 19 - 32 mmol/L   Glucose, Bld 97 70 - 99 mg/dL  BUN 11 6 - 23 mg/dL   Creatinine, Ser 1.61 0.50 - 1.35 mg/dL   Calcium 9.0 8.4 - 09.6 mg/dL   Total Protein 7.3 6.0 - 8.3 g/dL   Albumin 4.6 3.5 - 5.2 g/dL   AST 19 0 - 37 U/L   ALT 36 0 - 53 U/L   Alkaline Phosphatase 89 39 - 117 U/L   Total Bilirubin 0.2 (L) 0.3 - 1.2 mg/dL   GFR calc non Af Amer >90 >90 mL/min   GFR calc Af Amer >90 >90 mL/min   Anion gap 6 5 - 15  CBC with Differential  Result Value Ref Range   WBC 5.0 4.0 - 10.5 K/uL   RBC 4.66 4.22 - 5.81 MIL/uL   Hemoglobin 14.8 13.0 - 17.0 g/dL   HCT 04.5 40.9 - 81.1 %   MCV 93.3 78.0 - 100.0 fL   MCH 31.8 26.0 - 34.0 pg   MCHC 34.0 30.0 - 36.0 g/dL   RDW 91.4 78.2 - 95.6 %   Platelets 217 150 - 400 K/uL   Neutrophils Relative % 53 43 - 77 %   Neutro Abs 2.6 1.7 - 7.7 K/uL   Lymphocytes Relative 39 12 - 46 %   Lymphs Abs 2.0 0.7 - 4.0 K/uL   Monocytes Relative 7 3 - 12 %   Monocytes Absolute 0.4 0.1 - 1.0 K/uL   Eosinophils  Relative 1 0 - 5 %   Eosinophils Absolute 0.0 0.0 - 0.7 K/uL   Basophils Relative 0 0 - 1 %   Basophils Absolute 0.0 0.0 - 0.1 K/uL  Lipase, blood  Result Value Ref Range   Lipase 28 11 - 59 U/L  Occult blood card to lab, stool  Result Value Ref Range   Fecal Occult Bld NEGATIVE NEGATIVE   Ct Head Wo Contrast  12/16/2014   CLINICAL DATA:  Initial evaluation for acute double vision, pain at left thigh region.  EXAM: CT HEAD WITHOUT CONTRAST  TECHNIQUE: Contiguous axial images were obtained from the base of the skull through the vertex without intravenous contrast.  COMPARISON:  Prior study from 12/13/2014  FINDINGS: There is no acute intracranial hemorrhage or infarct. No mass lesion or midline shift. Gray-white matter differentiation is well maintained. Ventricles are normal in size without evidence of hydrocephalus. CSF containing spaces are within normal limits. No extra-axial fluid collection.  The calvarium is intact.  Orbital soft tissues are within normal limits.  The paranasal sinuses and mastoid air cells are well pneumatized and free of fluid.  Scalp soft tissues are unremarkable.  IMPRESSION: Normal head CT with no acute intracranial process identified.   Electronically Signed   By: Rise Mu M.D.   On: 12/16/2014 22:58   Ct Head W Wo Contrast  12/13/2014   CLINICAL DATA:  34 year old male with left side headache for 2 weeks and memory loss. Initial encounter. Current history of HIV  EXAM: CT HEAD WITHOUT AND WITH CONTRAST  TECHNIQUE: Contiguous axial images were obtained from the base of the skull through the vertex without and with intravenous contrast  CONTRAST:  80mL OMNIPAQUE IOHEXOL 300 MG/ML  SOLN  COMPARISON:  Head CT without contrast 08/21/2014.  FINDINGS: Stable mild ethmoid sinus mucosal thickening. Other Visualized paranasal sinuses and mastoids are clear. No acute osseous abnormality identified.  Visualized orbit soft tissues are within normal limits. Visualized scalp  soft tissues are within normal limits.  Cerebral volume is normal. No midline shift, ventriculomegaly, mass effect, evidence of  mass lesion, intracranial hemorrhage or evidence of cortically based acute infarction. Gray-white matter differentiation is within normal limits throughout the brain. No abnormal enhancement identified. Major intracranial vascular structures R enhancing.  IMPRESSION: Stable and normal CT appearance of the brain.   Electronically Signed   By: Odessa Fleming M.D.   On: 12/13/2014 11:50      Imaging Review No results found.   EKG Interpretation None      MDM   Final diagnoses:  Left lower quadrant pain  Anxiety    Patient presents with abdominal pain associated vomiting and diarrhea. He feels that it's related to his anxiety. He's feeling much better after some IV fluids as well as some Zofran and Klonopin for his anxiety. He does still have some mild tenderness to his left abdomen. His labs are normal. He's afebrile. I have low suspicion of other etiology such as diverticulitis or colitis. However I did discuss with him doing a CT scan. At this point he was going to hold off and return if he has any ongoing or worsening symptoms. I advised him to return if he has any worsening pain fevers or ongoing diarrhea. I did advise him he can give him a short prescription of Klonopin but then he needs to have ongoing prescriptions done by his primary care physician.    Rolan Bucco, MD 01/05/15 [phone]

## 2015-01-05 NOTE — ED Notes (Signed)
Pt reports n/v/d and abd pain for a little over a week. Denies fever, blood in vomit. Reports black, tarry stools for 2-3 weeks (has appt with GI scheduled in May). Reports dizziness, intermittent headaches; denies LOC. Reports frequent panic attacks; not sleeping well x4 days -- was taking Klonipin, but ran out about a week ago. Danna HeftyGolden, Harlee Eckroth Lee, RN

## 2015-01-05 NOTE — Discharge Instructions (Signed)

## 2015-02-11 ENCOUNTER — Other Ambulatory Visit: Payer: Self-pay | Admitting: Internal Medicine

## 2015-02-12 ENCOUNTER — Other Ambulatory Visit: Payer: Self-pay | Admitting: Internal Medicine

## 2015-02-17 ENCOUNTER — Encounter (HOSPITAL_BASED_OUTPATIENT_CLINIC_OR_DEPARTMENT_OTHER): Payer: Self-pay | Admitting: Emergency Medicine

## 2015-02-17 ENCOUNTER — Emergency Department (HOSPITAL_BASED_OUTPATIENT_CLINIC_OR_DEPARTMENT_OTHER)
Admission: EM | Admit: 2015-02-17 | Discharge: 2015-02-17 | Disposition: A | Discharge status: Home / Self Care | Payer: Self-pay | Attending: Emergency Medicine | Admitting: Emergency Medicine

## 2015-02-17 ENCOUNTER — Emergency Department (HOSPITAL_BASED_OUTPATIENT_CLINIC_OR_DEPARTMENT_OTHER): Payer: Self-pay

## 2015-02-17 DIAGNOSIS — Z21 Asymptomatic human immunodeficiency virus [HIV] infection status: Secondary | ICD-10-CM | POA: Insufficient documentation

## 2015-02-17 DIAGNOSIS — R7889 Finding of other specified substances, not normally found in blood: Secondary | ICD-10-CM

## 2015-02-17 DIAGNOSIS — I10 Essential (primary) hypertension: Secondary | ICD-10-CM | POA: Insufficient documentation

## 2015-02-17 DIAGNOSIS — R05 Cough: Secondary | ICD-10-CM

## 2015-02-17 DIAGNOSIS — R059 Cough, unspecified: Secondary | ICD-10-CM

## 2015-02-17 DIAGNOSIS — Z880 Allergy status to penicillin: Secondary | ICD-10-CM | POA: Insufficient documentation

## 2015-02-17 DIAGNOSIS — G43909 Migraine, unspecified, not intractable, without status migrainosus: Secondary | ICD-10-CM | POA: Insufficient documentation

## 2015-02-17 DIAGNOSIS — G40909 Epilepsy, unspecified, not intractable, without status epilepticus: Secondary | ICD-10-CM | POA: Insufficient documentation

## 2015-02-17 DIAGNOSIS — F419 Anxiety disorder, unspecified: Secondary | ICD-10-CM | POA: Insufficient documentation

## 2015-02-17 DIAGNOSIS — Z720 Tobacco use: Secondary | ICD-10-CM | POA: Insufficient documentation

## 2015-02-17 DIAGNOSIS — R7989 Other specified abnormal findings of blood chemistry: Secondary | ICD-10-CM | POA: Insufficient documentation

## 2015-02-17 DIAGNOSIS — Z79899 Other long term (current) drug therapy: Secondary | ICD-10-CM | POA: Insufficient documentation

## 2015-02-17 DIAGNOSIS — M546 Pain in thoracic spine: Secondary | ICD-10-CM

## 2015-02-17 LAB — CBC WITH DIFFERENTIAL/PLATELET
Basophils Absolute: 0 10*3/uL (ref 0.0–0.1)
Basophils Relative: 0 % (ref 0–1)
EOS ABS: 0 10*3/uL (ref 0.0–0.7)
EOS PCT: 1 % (ref 0–5)
HCT: 45.5 % (ref 39.0–52.0)
Hemoglobin: 15.6 g/dL (ref 13.0–17.0)
Lymphocytes Relative: 34 % (ref 12–46)
Lymphs Abs: 2.8 10*3/uL (ref 0.7–4.0)
MCH: 32 pg (ref 26.0–34.0)
MCHC: 34.3 g/dL (ref 30.0–36.0)
MCV: 93.4 fL (ref 78.0–100.0)
MONOS PCT: 7 % (ref 3–12)
Monocytes Absolute: 0.6 10*3/uL (ref 0.1–1.0)
Neutro Abs: 4.8 10*3/uL (ref 1.7–7.7)
Neutrophils Relative %: 58 % (ref 43–77)
PLATELETS: 231 10*3/uL (ref 150–400)
RBC: 4.87 MIL/uL (ref 4.22–5.81)
RDW: 12.5 % (ref 11.5–15.5)
WBC: 8.3 10*3/uL (ref 4.0–10.5)

## 2015-02-17 LAB — BASIC METABOLIC PANEL
ANION GAP: 7 (ref 5–15)
BUN: 10 mg/dL (ref 6–20)
CALCIUM: 9.2 mg/dL (ref 8.9–10.3)
CO2: 27 mmol/L (ref 22–32)
Chloride: 105 mmol/L (ref 101–111)
Creatinine, Ser: 0.82 mg/dL (ref 0.61–1.24)
GFR calc Af Amer: >60 mL/min (ref >60)
Glucose, Bld: 100 mg/dL — ABNORMAL HIGH (ref 65–99)
Potassium: 3.7 mmol/L (ref 3.5–5.1)
SODIUM: 139 mmol/L (ref 135–145)

## 2015-02-17 LAB — PHENYTOIN LEVEL, TOTAL: Phenytoin Lvl: 3 ug/mL — ABNORMAL LOW (ref 10.0–20.0)

## 2015-02-17 MED ORDER — PHENYTOIN SODIUM EXTENDED 100 MG PO CAPS
400.0000 mg | ORAL_CAPSULE | Freq: Once | ORAL | Status: AC
Start: 2015-02-17 — End: 2015-02-17
  Administered 2015-02-17: 400 mg via ORAL
  Filled 2015-02-17: qty 4

## 2015-02-17 MED ORDER — HYDROCODONE-ACETAMINOPHEN 5-325 MG PO TABS
1.0000 | ORAL_TABLET | Freq: Once | ORAL | Status: AC
  Administered 2015-02-17: 1 via ORAL
  Filled 2015-02-17: qty 1

## 2015-02-17 MED ORDER — ALPRAZOLAM 1 MG PO TABS: 1.0000 mg | ORAL_TABLET | Freq: Every evening | ORAL | Status: DC | PRN

## 2015-02-17 MED ORDER — SODIUM CHLORIDE 0.9 % IV SOLN: 1000.0000 mg | Freq: Once | INTRAVENOUS | Status: DC

## 2015-02-17 MED ORDER — LEVETIRACETAM 500 MG/5ML IV SOLN
INTRAVENOUS | Status: AC
  Administered 2015-02-17: 1000 mg
  Filled 2015-02-17: qty 10

## 2015-02-17 MED ORDER — SODIUM CHLORIDE 0.9 % IV SOLN: 1000.0000 mg | Freq: Once | INTRAVENOUS | Status: AC

## 2015-02-17 MED ORDER — HYDROXYZINE HCL 25 MG PO TABS
50.0000 mg | ORAL_TABLET | Freq: Once | ORAL | Status: AC
  Administered 2015-02-17: 50 mg via ORAL
  Filled 2015-02-17: qty 2

## 2015-02-17 MED ORDER — ALPRAZOLAM 0.5 MG PO TABS
1.0000 mg | ORAL_TABLET | Freq: Once | ORAL | Status: AC
  Administered 2015-02-17: 1 mg via ORAL
  Filled 2015-02-17: qty 2

## 2015-02-17 MED ORDER — PHENYTOIN SODIUM EXTENDED 100 MG PO CAPS: 100.0000 mg | ORAL_CAPSULE | Freq: Three times a day (TID) | ORAL | Status: DC

## 2015-02-17 MED ORDER — ONDANSETRON HCL 4 MG/2ML IJ SOLN
4.0000 mg | Freq: Once | INTRAMUSCULAR | Status: AC
  Administered 2015-02-17: 4 mg via INTRAVENOUS
  Filled 2015-02-17: qty 2

## 2015-02-17 MED ORDER — LEVETIRACETAM 500 MG PO TABS: 500.0000 mg | ORAL_TABLET | Freq: Two times a day (BID) | ORAL | Status: DC

## 2015-02-17 MED ORDER — HYDROCODONE-ACETAMINOPHEN 5-325 MG PO TABS: ORAL_TABLET | ORAL | Status: DC

## 2015-02-17 NOTE — ED Notes (Signed)
KEPPRA stopped/infused.

## 2015-02-17 NOTE — Discharge Instructions (Signed)
Take vicodin for breakthrough pain, do not drink alcohol, drive, care for children or do other critical tasks while taking vicodin. ° °Do not hesitate to return to the emergency room for any new, worsening or concerning symptoms. ° °Please obtain primary care using resource guide below. Let them know that you were seen in the emergency room and that they will need to obtain records for further outpatient management. ° ° °Emergency Department Resource Guide °1) Find a Doctor and Pay Out of Pocket °Although you won't have to find out who is covered by your insurance plan, it is a good idea to ask around and get recommendations. You will then need to call the office and see if the doctor you have chosen will accept you as a new patient and what types of options they offer for patients who are self-pay. Some doctors offer discounts or will set up payment plans for their patients who do not have insurance, but you will need to ask so you aren't surprised when you get to your appointment. ° °2) Contact Your Local Health Department °Not all health departments have doctors that can see patients for sick visits, but many do, so it is worth a call to see if yours does. If you don't know where your local health department is, you can check in your phone book. The CDC also has a tool to help you locate your state's health department, and many state websites also have listings of all of their local health departments. ° °3) Find a Walk-in Clinic °If your illness is not likely to be very severe or complicated, you may want to try a walk in clinic. These are popping up all over the country in pharmacies, drugstores, and shopping centers. They're usually staffed by nurse practitioners or physician assistants that have been trained to treat common illnesses and complaints. They're usually fairly quick and inexpensive. However, if you have serious medical issues or chronic medical problems, these are probably not your best  option. ° °No Primary Care Doctor: °- Call [facility] Connect at  832-8000 - they can help you locate a primary care doctor that  accepts your insurance, provides certain services, etc. °- Physician Referral Service- 1-800-533-3463 ° °Chronic Pain Problems: °Organization         Address  Phone   Notes  °[facility]  (336) 297-2271 Patients need to be referred by their primary care doctor.  ° °Medication Assistance: °Organization         Address  Phone   Notes  °[place] [facility] [address] °[place], [place] [address] (336) 641-8030 --Must be a resident of [place] °-- Must have NO insurance coverage whatsoever (no Medicaid/ Medicare, etc.) °-- The pt. MUST have a primary care doctor that directs their care regularly and follows them in the community °  °[facility]  (866) 331-1348   °[employer]  (888) 892-1162   ° °Agencies that provide inexpensive medical care: °Organization         Address  Phone   Notes  °[facility]  (336) 832-8035   °[facility]    (336) 832-7272   °[facility] [address] °[place], [place] [address] (336) 832-4777   °[facility] [address], °[place] (336) 271-4999   °[facility]    (336) 373-0678   °[facility]    (336) 272-1050   °[facility] ° [address], [place] Phone:  (336) 832-4444, Fax:  (336) 832-4440 Hours of Operation:  9 am - 6 pm, M-F.  Also accepts Medicaid/Medicare and self-pay.  °[facility] ° [address], [place] Phone: (336) 832-3150, Fax: (336) 832-3151. Hours of Operation:  8:30 am - 5:30 pm, M-F.  Also accepts Medicaid and self-pay.  °[facility] [place] [address], [place] Phone: (336) 878-6027   °[facility] [address], [place], [place] (336)723-1848, Ext. 123 Mondays & Thursdays: 7-9 AM.  First 15  patients are seen on a first come, first serve basis. °  ° °Medicaid-accepting [place] Providers: ° °Organization         Address  Phone   Notes  °[facility] [address], [place] (336) 641-2100 Also accepts self-pay patients.  °[facility] [address], [place] ° (336) 856-9996   °[facility] [address], [place] (336) 288-8857   °[facility] [address] [place] [address], [place] (336) 299-7000   °Veita Bland [address], [place]  ° (336) 373-1557 Only accepts [place] Access Medicaid patients after they have their name applied to their card.  ° °Self-Pay (no insurance) in [place]: ° °Organization         Address  Phone   Notes  °Sickle Cell Patients, [facility] [address], [place] (336) 832-1970   °[facility] [address], [place] (336) 832-4400   °[facility] ° [address], [place] (336) 992-4800   °[facility]/Dr. Osei-Bonsu ° [address] [place] [address], [place] or [address], [place] (336) 841-8500 Phone number for both [place] and [place] locations is the same.  °[facility] [address], [place] (336) 299-0000   °[facility] [address] [place] [address], [place] or [address] (336) 852-7530 °(336) 878-2260   °[facility] [address], [place] (336) 350-1642, phone; (336) 294-5005, fax Sees patients 1st and 3rd Saturday of every month.  Must not qualify for public or private insurance (i.e. Medicaid, Medicare, [place] Health Choice, Veterans' Benefits) • Household income should be no more than 200% of the poverty level •The clinic cannot treat you if you are pregnant or think you are pregnant • Sexually transmitted diseases are not treated at the clinic.  ° ° °Dental  Care: °Organization         Address  Phone  Notes  °[facility] [address], [place] (336) 641-6152 Accepts children up to age 21 who are enrolled in Medicaid or [place] Health Choice; pregnant women with a Medicaid card; and children who have applied for Medicaid or [place] Health Choice, but were declined, whose parents can pay a reduced fee at time of service.  °[facility] [place]  [address], [place] (336) 641-7733 Accepts children up to age 21 who are enrolled in Medicaid or [place] Health Choice; pregnant women with a Medicaid card; and children who have applied for Medicaid or [place] Health Choice, but were declined, whose parents can pay a reduced fee at time of service.  °[facility] ° [address], [place] (  336) 641-4533 Patients are seen by appointment only. Walk-ins are not accepted. [facility] will see patients 18 years of age and older. °Monday - Tuesday (8am-5pm) °Most Wednesdays (8:30-5pm) °$30 per visit, cash only  °[facility] ° [address], [place] (336) 641-4533 Patients are seen by appointment only. Walk-ins are not accepted. [facility] will see patients 18 years of age and older. °One Wednesday Evening (Monthly: Volunteer Based).  $30 per visit, cash only  °[facility]  (919) 537-3737 for adults; Children under age 4, call Graduate Pediatric Dentistry at (919) [phone]. Children aged 4-14, please call (919) [phone] to request a pediatric application. ° Dental services are provided in all areas of dental care including fillings, crowns and bridges, complete and partial dentures, implants, gum treatment, root canals, and extractions. Preventive care is also provided. Treatment is provided to both adults and children. °Patients are selected via a lottery and there is often a waiting list. °  °[facility] [address], °[place] ° (336) 763-8833 www.drcivils.com °  °[facility] [address], [place], [place] (336)723-1848, Ext. 123 Second and Fourth Thursday of each month, opens at 6:30 AM; Clinic ends at 9 AM.  Patients are seen on a first-come first-served basis, and a limited number are seen during each clinic.  ° °[facility] ° [address], [place], [place] (336) 723-7904   Eligibility Requirements °You must have lived in [place], [place], or [place] counties for at least the last three months. °  You cannot be eligible for state or federal sponsored healthcare insurance, including Veterans Administration, Medicaid, or Medicare. °  You generally cannot be eligible for healthcare insurance through your employer.  °  How to apply: °Eligibility screenings are held every Tuesday and Wednesday afternoon from 1:00 pm until 4:00 pm. You do not need an appointment for the interview!  °[facility] [address], [place], [place] 336-631-2330   °[facility] [facility]  336-342-8273   °[facility]  336-703-3100   °[facility]  336-570-6415   ° °Behavioral Health Resources in the Community: °Intensive Outpatient Programs °Organization         Address  Phone  Notes  °[facility] [address], [place], [place] 336-878-6098   °[facility] [address], [place], [place] 336-832-9800   °[facility] [address], [place], [place] ° 336-882-2125   °[facility] [address],  °[place], [place] 1-800-853-5163 or 336-641-4981   °Substance Abuse Resources °Organization         Address  Phone  Notes  °[facility]  336-882-2125   °[facility]  336-784-9470   °[facility]  336-285-9073   °[facility]  336-845-3988   °Residential & Outpatient Substance Abuse Program  1-800-659-3381    °Psychological Services °Organization         Address  Phone  Notes  °[facility]  336- 832-9600   °[facility]  336- 378-7881   °[facility] [address], [place] 1-800-853-5163 or 336-641-4981   ° °Mobile Crisis Teams °Organization         Address  Phone  Notes  °[facility], Mobile Crisis Care Unit  1-877-626-1772   °Assertive °[facility] ° [address] [place], [place] 336-834-9664   °Sharon DeEsch [address] °[place] 336-554-5454   ° °Self-Help/Support Groups °Organization         Address  Phone             Notes  °[facility] - variety of support groups  336- 373-1402 Call for more information  °Narcotics Anonymous (NA), [facility] [address], °[place]  2 meetings at this location  ° °Residential Treatment Programs °Organization         Address  Phone  Notes  °[facility] [address],    °[place]  1-866-801-8205   °[facility] ° [address], [place], [place] 704-293-8524   °[facility] [address], [place] 336-845-3988 Admissions: 8am-3pm M-F  °[facility] [address]    °[place], [place] 336-841-1104   °[facility] [address], [place], [place] 336-379-7146   °[facility] [address]  °[place], [place] 336-285-9073   °[facility] Intensive Outpatient [address], [place], [place] 336-852-3033   °[facility] [address]  °[place], [place] 1-877-615-2722 or 336-784-9470   °[facility] [address] [place], [place] 336-227-7417 Accepts Medicaid  °[facility] [address]  ° [place] 1-800-659-3381 Substance Abuse/Addiction Treatment  ° °[place] Behavioral Health Resources °Organization         Address  Phone  Notes  °[facility]  (888) 581-9988   °Julie Brannon, PhD [address] [place], [place]   (336) 349-5553 or (336) 951-0000   °[facility]   [address] °[place], [place] (336) 349-4454   °[facility] [address], [place], [place] (336) 342-8316 Insurance/Medicaid/sponsorship through [facility]  °[facility] [address]                                    [place], [place] (336) 342-8316 Therapy/tele-psych/case  °[facility] [address]  ° [place], [place] (336) 349-2233    °Dr. Arfeen  (336) 349-4544   °[facility] [place]  [employer] [place] [facility] 1) [address],  °2) [address], [place] °3)  [address], [place] (336) 349-3220 °(336) 342-7768 ° °(336) 342-8140   °[place] [facility] (336) 342-1394 or (336) 342-3537 (After Hours)    ° ° °] ° °

## 2015-02-17 NOTE — ED Notes (Signed)
Per EDPA N. Pisciotta, PA: Pt contacted at home using mobile number [phone]. Pt instructed to take Dilantin 200mg  now, and Dilantin 200mg  2 hrs later. Pt verbalizes understanding and states, "Thank you, I will do that".

## 2015-02-17 NOTE — ED Notes (Signed)
Pt states has been feeling increased anxiety for past week, along with middle back pain.

## 2015-02-17 NOTE — ED Notes (Signed)
C/o upper R back pain, HA, nausea and anxiety. H/o anxiety. "Takes klonopin and xanax, but is out of meds, is b/w primary physicians, waiting on first appt.", pt alert, NAD, anxious, interactive[facility] V[facility]. keppra infusing.
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
## 2015-02-17 NOTE — ED Provider Notes (Signed)
Patient seen/examined in the Emergency Department in conjunction with Midlevel Provider  Patient reports multiple complaints including anxiety, recent seizure as well as midline back pain - no focal weakness reported Exam : awake/alert, no distress, no focal weakness noted in upper/lower extremities.  Mild tenderness to midline back, no bruising is noted Plan: labs pending at this time.  He will be loaded with AED medication as he admits noncompliance    Zadie Rhineonald Aylani Spurlock, MD 02/17/15 1935

## 2015-02-18 ENCOUNTER — Telehealth: Payer: Self-pay | Admitting: *Deleted

## 2015-02-18 NOTE — Telephone Encounter (Signed)
Follow-up from ED visit for "seizure."  Pt was told to call after being seen at the ED yesterday.  The pt has an appt to establish care at [facility] on Tues., May 31.  RN advised pt to keep this follow-up appt for refills of his "seizure" medication.  Pt verbalized understanding.

## 2015-02-26 ENCOUNTER — Ambulatory Visit (INDEPENDENT_AMBULATORY_CARE_PROVIDER_SITE_OTHER): Payer: No Typology Code available for payment source | Admitting: Family Medicine

## 2015-02-26 ENCOUNTER — Telehealth: Payer: Self-pay

## 2015-02-26 VITALS — BP 137/98 | HR 74 | Temp 98.3°F | Resp 16 | Ht 64.0 in | Wt 149.0 lb

## 2015-02-26 DIAGNOSIS — I10 Essential (primary) hypertension: Secondary | ICD-10-CM

## 2015-02-26 DIAGNOSIS — M546 Pain in thoracic spine: Secondary | ICD-10-CM

## 2015-02-26 DIAGNOSIS — M549 Dorsalgia, unspecified: Secondary | ICD-10-CM

## 2015-02-26 DIAGNOSIS — F064 Anxiety disorder due to known physiological condition: Secondary | ICD-10-CM

## 2015-02-26 DIAGNOSIS — A609 Anogenital herpesviral infection, unspecified: Secondary | ICD-10-CM

## 2015-02-26 DIAGNOSIS — M5489 Other dorsalgia: Secondary | ICD-10-CM

## 2015-02-26 DIAGNOSIS — G40301 Generalized idiopathic epilepsy and epileptic syndromes, not intractable, with status epilepticus: Secondary | ICD-10-CM

## 2015-02-26 DIAGNOSIS — G8929 Other chronic pain: Secondary | ICD-10-CM | POA: Insufficient documentation

## 2015-02-26 DIAGNOSIS — G40901 Epilepsy, unspecified, not intractable, with status epilepticus: Secondary | ICD-10-CM | POA: Insufficient documentation

## 2015-02-26 DIAGNOSIS — Z21 Asymptomatic human immunodeficiency virus [HIV] infection status: Secondary | ICD-10-CM

## 2015-02-26 DIAGNOSIS — F410 Panic disorder [episodic paroxysmal anxiety] without agoraphobia: Secondary | ICD-10-CM

## 2015-02-26 DIAGNOSIS — A6002 Herpesviral infection of other male genital organs: Secondary | ICD-10-CM

## 2015-02-26 DIAGNOSIS — B20 Human immunodeficiency virus [HIV] disease: Secondary | ICD-10-CM

## 2015-02-26 LAB — POCT URINALYSIS DIP (DEVICE)
Bilirubin Urine: NEGATIVE
GLUCOSE, UA: NEGATIVE mg/dL
Hgb urine dipstick: NEGATIVE
Ketones, ur: NEGATIVE mg/dL
Leukocytes, UA: NEGATIVE
Nitrite: NEGATIVE
Protein, ur: NEGATIVE mg/dL
SPECIFIC GRAVITY, URINE: 1.01 (ref 1.005–1.030)
UROBILINOGEN UA: 0.2 mg/dL (ref 0.0–1.0)
pH: 7 (ref 5.0–8.0)

## 2015-02-26 MED ORDER — ALPRAZOLAM 0.25 MG PO TABS: 0.2500 mg | ORAL_TABLET | Freq: Three times a day (TID) | ORAL | Status: DC | PRN

## 2015-02-26 NOTE — Patient Instructions (Addendum)
DASH Eating Plan DASH stands for "Dietary Approaches to Stop Hypertension." The DASH eating plan is a healthy eating plan that has been shown to reduce high blood pressure (hypertension). Additional health benefits may include reducing the risk of type 2 diabetes mellitus, heart disease, and stroke. The DASH eating plan may also help with weight loss. WHAT DO I NEED TO KNOW ABOUT THE DASH EATING PLAN? For the DASH eating plan, you will follow these general guidelines:  Choose foods with a percent daily value for sodium of less than 5% (as listed on the food label).  Use salt-free seasonings or herbs instead of table salt or sea salt.  Check with your health care provider or pharmacist before using salt substitutes.  Eat lower-sodium products, often labeled as "lower sodium" or "no salt added."  Eat fresh foods.  Eat more vegetables, fruits, and low-fat dairy products.  Choose whole grains. Look for the word "whole" as the first word in the ingredient list.  Choose fish and skinless chicken or turkey more often than red meat. Limit fish, poultry, and meat to 6 oz (170 g) each day.  Limit sweets, desserts, sugars, and sugary drinks.  Choose heart-healthy fats.  Limit cheese to 1 oz (28 g) per day.  Eat more home-cooked food and less restaurant, buffet, and fast food.  Limit fried foods.  Cook foods using methods other than frying.  Limit canned vegetables. If you do use them, rinse them well to decrease the sodium.  When eating at a restaurant, ask that your food be prepared with less salt, or no salt if possible. WHAT FOODS CAN I EAT? Seek help from a dietitian for individual calorie needs. Grains Whole grain or whole wheat bread. Brown rice. Whole grain or whole wheat pasta. Quinoa, bulgur, and whole grain cereals. Low-sodium cereals. Corn or whole wheat flour tortillas. Whole grain cornbread. Whole grain crackers. Low-sodium crackers. Vegetables Fresh or frozen vegetables  (raw, steamed, roasted, or grilled). Low-sodium or reduced-sodium tomato and vegetable juices. Low-sodium or reduced-sodium tomato sauce and paste. Low-sodium or reduced-sodium canned vegetables.  Fruits All fresh, canned (in natural juice), or frozen fruits. Meat and Other Protein Products Ground beef (85% or leaner), grass-fed beef, or beef trimmed of fat. Skinless chicken or turkey. Ground chicken or turkey. Pork trimmed of fat. All fish and seafood. Eggs. Dried beans, peas, or lentils. Unsalted nuts and seeds. Unsalted canned beans. Dairy Low-fat dairy products, such as skim or 1% milk, 2% or reduced-fat cheeses, low-fat ricotta or cottage cheese, or plain low-fat yogurt. Low-sodium or reduced-sodium cheeses. Fats and Oils Tub margarines without trans fats. Light or reduced-fat mayonnaise and salad dressings (reduced sodium). Avocado. Safflower, olive, or canola oils. Natural peanut or almond butter. Other Unsalted popcorn and pretzels. The items listed above may not be a complete list of recommended foods or beverages. Contact your dietitian for more options. WHAT FOODS ARE NOT RECOMMENDED? Grains White bread. White pasta. White rice. Refined cornbread. Bagels and croissants. Crackers that contain trans fat. Vegetables Creamed or fried vegetables. Vegetables in a cheese sauce. Regular canned vegetables. Regular canned tomato sauce and paste. Regular tomato and vegetable juices. Fruits Dried fruits. Canned fruit in light or heavy syrup. Fruit juice. Meat and Other Protein Products Fatty cuts of meat. Ribs, chicken wings, bacon, sausage, bologna, salami, chitterlings, fatback, hot dogs, bratwurst, and packaged luncheon meats. Salted nuts and seeds. Canned beans with salt. Dairy Whole or 2% milk, cream, half-and-half, and cream cheese. Whole-fat or sweetened yogurt. Full-fat   cheeses or blue cheese. Nondairy creamers and whipped toppings. Processed cheese, cheese spreads, or cheese  curds. Condiments Onion and garlic salt, seasoned salt, table salt, and sea salt. Canned and packaged gravies. Worcestershire sauce. Tartar sauce. Barbecue sauce. Teriyaki sauce. Soy sauce, including reduced sodium. Steak sauce. Fish sauce. Oyster sauce. Cocktail sauce. Horseradish. Ketchup and mustard. Meat flavorings and tenderizers. Bouillon cubes. Hot sauce. Tabasco sauce. Marinades. Taco seasonings. Relishes. Fats and Oils Butter, stick margarine, lard, shortening, ghee, and bacon fat. Coconut, palm kernel, or palm oils. Regular salad dressings. Other Pickles and olives. Salted popcorn and pretzels. The items listed above may not be a complete list of foods and beverages to avoid. Contact your dietitian for more information. WHERE CAN I FIND MORE INFORMATION? National Heart, Lung, and Blood Institute: travelstabloid.com Document Released: 09/03/2011 Document Revised: 01/29/2014 Document Reviewed: 07/19/2013 [facility] Patient Information 2015 [place], [place]. This information is not intended to replace advice given to you by your health care provider. Make sure you discuss any questions you have with your health care provider. Hypertension Hypertension, commonly called high blood pressure, is when the force of blood pumping through your arteries is too strong. Your arteries are the blood vessels that carry blood from your heart throughout your body. A blood pressure reading consists of a higher number over a lower number, such as 110/72. The higher number (systolic) is the pressure inside your arteries when your heart pumps. The lower number (diastolic) is the pressure inside your arteries when your heart relaxes. Ideally you want your blood pressure below 120/80. Hypertension forces your heart to work harder to pump blood. Your arteries may become narrow or stiff. Having hypertension puts you at risk for heart disease, stroke, and other problems.  RISK  FACTORS Some risk factors for high blood pressure are controllable. Others are not.  Risk factors you cannot control include:   Race. You may be at higher risk if you are African American.  Age. Risk increases with age.  Gender. Men are at higher risk than women before age 37 years. After age 55, women are at higher risk than men. Risk factors you can control include:  Not getting enough exercise or physical activity.  Being overweight.  Getting too much fat, sugar, calories, or salt in your diet.  Drinking too much alcohol. SIGNS AND SYMPTOMS Hypertension does not usually cause signs or symptoms. Extremely high blood pressure (hypertensive crisis) may cause headache, anxiety, shortness of breath, and nosebleed. DIAGNOSIS  To check if you have hypertension, your health care provider will measure your blood pressure while you are seated, with your arm held at the level of your heart. It should be measured at least twice using the same arm. Certain conditions can cause a difference in blood pressure between your right and left arms. A blood pressure reading that is higher than normal on one occasion does not mean that you need treatment. If one blood pressure reading is high, ask your health care provider about having it checked again. TREATMENT  Treating high blood pressure includes making lifestyle changes and possibly taking medicine. Living a healthy lifestyle can help lower high blood pressure. You may need to change some of your habits. Lifestyle changes may include:  Following the DASH diet. This diet is high in fruits, vegetables, and whole grains. It is low in salt, red meat, and added sugars.  Getting at least 2 hours of brisk physical activity every week.  Losing weight if necessary.  Not smoking.  Limiting  alcoholic beverages.  Learning ways to reduce stress. If lifestyle changes are not enough to get your blood pressure under control, your health care provider may  prescribe medicine. You may need to take more than one. Work closely with your health care provider to understand the risks and benefits. HOME CARE INSTRUCTIONS  Have your blood pressure rechecked as directed by your health care provider.   Take medicines only as directed by your health care provider. Follow the directions carefully. Blood pressure medicines must be taken as prescribed. The medicine does not work as well when you skip doses. Skipping doses also puts you at risk for problems.   Do not smoke.   Monitor your blood pressure at home as directed by your health care provider. SEEK MEDICAL CARE IF:   You think you are having a reaction to medicines taken.  You have recurrent headaches or feel dizzy.  You have swelling in your ankles.  You have trouble with your vision. SEEK IMMEDIATE MEDICAL CARE IF:  You develop a severe headache or confusion.  You have unusual weakness, numbness, or feel faint.  You have severe chest or abdominal pain.  You vomit repeatedly.  You have trouble breathing. MAKE SURE YOU:   Understand these instructions.  Will watch your condition.  Will get help right away if you are not doing well or get worse. Document Released: 09/14/2005 Document Revised: 01/29/2014 Document Reviewed: 07/07/2013 [facility] Patient Information 2015 [place], [place]. This information is not intended to replace advice given to you by your health care provider. Make sure you discuss any questions you have with your health care provider. Depression Depression refers to feeling sad, low, down in the dumps, blue, gloomy, or empty. In general, there are two kinds of depression:  Normal sadness or normal grief. This kind of depression is one that we all feel from time to time after upsetting life experiences, such as the loss of a job or the ending of a relationship. This kind of depression is considered normal, is short lived, and resolves within a few days to 2 weeks.  Depression experienced after the loss of a loved one (bereavement) often lasts longer than 2 weeks but normally gets better with time.  Clinical depression. This kind of depression lasts longer than normal sadness or normal grief or interferes with your ability to function at home, at work, and in school. It also interferes with your personal relationships. It affects almost every aspect of your life. Clinical depression is an illness. Symptoms of depression can also be caused by conditions other than those mentioned above, such as:  Physical illness. Some physical illnesses, including underactive thyroid gland (hypothyroidism), severe anemia, specific types of cancer, diabetes, uncontrolled seizures, heart and lung problems, strokes, and chronic pain are commonly associated with symptoms of depression.  Side effects of some prescription medicine. In some people, certain types of medicine can cause symptoms of depression.  Substance abuse. Abuse of alcohol and illicit drugs can cause symptoms of depression. SYMPTOMS Symptoms of normal sadness and normal grief include the following:  Feeling sad or crying for short periods of time.  Not caring about anything (apathy).  Difficulty sleeping or sleeping too much.  No longer able to enjoy the things you used to enjoy.  Desire to be by oneself all the time (social isolation).  Lack of energy or motivation.  Difficulty concentrating or remembering.  Change in appetite or weight.  Restlessness or agitation. Symptoms of clinical depression include the same symptoms  of normal sadness or normal grief and also the following symptoms:  Feeling sad or crying all the time.  Feelings of guilt or worthlessness.  Feelings of hopelessness or helplessness.  Thoughts of suicide or the desire to harm yourself (suicidal ideation).  Loss of touch with reality (psychotic symptoms). Seeing or hearing things that are not real (hallucinations) or having  false beliefs about your life or the people around you (delusions and paranoia). DIAGNOSIS  The diagnosis of clinical depression is usually based on how bad the symptoms are and how long they have lasted. Your health care provider will also ask you questions about your medical history and substance use to find out if physical illness, use of prescription medicine, or substance abuse is causing your depression. Your health care provider may also order blood tests. TREATMENT  Often, normal sadness and normal grief do not require treatment. However, sometimes antidepressant medicine is given for bereavement to ease the depressive symptoms until they resolve. The treatment for clinical depression depends on how bad the symptoms are but often includes antidepressant medicine, counseling with a mental health professional, or both. Your health care provider will help to determine what treatment is best for you. Depression caused by physical illness usually goes away with appropriate medical treatment of the illness. If prescription medicine is causing depression, talk with your health care provider about stopping the medicine, decreasing the dose, or changing to another medicine. Depression caused by the abuse of alcohol or illicit drugs goes away when you stop using these substances. Some adults need professional help in order to stop drinking or using drugs. SEEK IMMEDIATE MEDICAL CARE IF:  You have thoughts about hurting yourself or others.  You lose touch with reality (have psychotic symptoms).  You are taking medicine for depression and have a serious side effect. FOR MORE INFORMATION  National Alliance on Mental Illness: www.nami.[employer] of Mental Health: http://www.maynard.net/ Document Released: 09/11/2000 Document Revised: 01/29/2014 Document Reviewed: 12/14/2011 [facility] Patient Information 2015 [place], [place]. This information is not intended to replace advice given to you by  your health care provider. Make sure you discuss any questions you have with your health care provider. Generalized Anxiety Disorder Generalized anxiety disorder (GAD) is a mental disorder. It interferes with life functions, including relationships, work, and school. GAD is different from normal anxiety, which everyone experiences at some point in their lives in response to specific life events and activities. Normal anxiety actually helps [place] prepare for and get through these life events and activities. Normal anxiety goes away after the event or activity is over.  GAD causes anxiety that is not necessarily related to specific events or activities. It also causes excess anxiety in proportion to specific events or activities. The anxiety associated with GAD is also difficult to control. GAD can vary from mild to severe. People with severe GAD can have intense waves of anxiety with physical symptoms (panic attacks).  SYMPTOMS The anxiety and worry associated with GAD are difficult to control. This anxiety and worry are related to many life events and activities and also occur more days than not for 6 months or longer. People with GAD also have three or more of the following symptoms (one or more in children):  Restlessness.   Fatigue.  Difficulty concentrating.   Irritability.  Muscle tension.  Difficulty sleeping or unsatisfying sleep. DIAGNOSIS GAD is diagnosed through an assessment by your health care provider. Your health care provider will ask you questions aboutyour mood,physical symptoms,  and events in your life. Your health care provider may ask you about your medical history and use of alcohol or drugs, including prescription medicines. Your health care provider may also do a physical exam and blood tests. Certain medical conditions and the use of certain substances can cause symptoms similar to those associated with GAD. Your health care provider may refer you to a mental health  specialist for further evaluation. TREATMENT The following therapies are usually used to treat GAD:   Medication. Antidepressant medication usually is prescribed for long-term daily control. Antianxiety medicines may be added in severe cases, especially when panic attacks occur.   Talk therapy (psychotherapy). Certain types of talk therapy can be helpful in treating GAD by providing support, education, and guidance. A form of talk therapy called cognitive behavioral therapy can teach you healthy ways to think about and react to daily life events and activities.  Stress managementtechniques. These include yoga, meditation, and exercise and can be very helpful when they are practiced regularly. A mental health specialist can help determine which treatment is best for you. Some people see improvement with one therapy. However, other people require a combination of therapies. Document Released: 01/09/2013 Document Revised: 01/29/2014 Document Reviewed: 01/09/2013 [facility] Patient Information 2015 [place], [place]. This information is not intended to replace advice given to you by your health care provider. Make sure you discuss any questions you have with your health care provider. Generalized Anxiety Disorder Generalized anxiety disorder (GAD) is a mental disorder. It interferes with life functions, including relationships, work, and school. GAD is different from normal anxiety, which everyone experiences at some point in their lives in response to specific life events and activities. Normal anxiety actually helps [place] prepare for and get through these life events and activities. Normal anxiety goes away after the event or activity is over.  GAD causes anxiety that is not necessarily related to specific events or activities. It also causes excess anxiety in proportion to specific events or activities. The anxiety associated with GAD is also difficult to control. GAD can vary from mild to severe. People  with severe GAD can have intense waves of anxiety with physical symptoms (panic attacks).  SYMPTOMS The anxiety and worry associated with GAD are difficult to control. This anxiety and worry are related to many life events and activities and also occur more days than not for 6 months or longer. People with GAD also have three or more of the following symptoms (one or more in children):  Restlessness.   Fatigue.  Difficulty concentrating.   Irritability.  Muscle tension.  Difficulty sleeping or unsatisfying sleep. DIAGNOSIS GAD is diagnosed through an assessment by your health care provider. Your health care provider will ask you questions aboutyour mood,physical symptoms, and events in your life. Your health care provider may ask you about your medical history and use of alcohol or drugs, including prescription medicines. Your health care provider may also do a physical exam and blood tests. Certain medical conditions and the use of certain substances can cause symptoms similar to those associated with GAD. Your health care provider may refer you to a mental health specialist for further evaluation. TREATMENT The following therapies are usually used to treat GAD:   Medication. Antidepressant medication usually is prescribed for long-term daily control. Antianxiety medicines may be added in severe cases, especially when panic attacks occur.   Talk therapy (psychotherapy). Certain types of talk therapy can be helpful in treating GAD by providing support, education, and guidance. A  form of talk therapy called cognitive behavioral therapy can teach you healthy ways to think about and react to daily life events and activities.  Stress managementtechniques. These include yoga, meditation, and exercise and can be very helpful when they are practiced regularly. A mental health specialist can help determine which treatment is best for you. Some people see improvement with one therapy. However,  other people require a combination of therapies. Document Released: 01/09/2013 Document Revised: 01/29/2014 Document Reviewed: 01/09/2013 [facility] Patient Information 2015 [place], [place]. This information is not intended to replace advice given to you by your health care provider. Make sure you discuss any questions you have with your health care provider.
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
## 2015-02-27 ENCOUNTER — Encounter: Payer: Self-pay | Admitting: Family Medicine

## 2015-02-27 ENCOUNTER — Ambulatory Visit: Payer: No Typology Code available for payment source | Admitting: Family Medicine

## 2015-02-27 LAB — PHENYTOIN LEVEL, TOTAL: Phenytoin Lvl: 2.5 ug/mL — ABNORMAL LOW (ref 10.0–20.0)

## 2015-03-04 ENCOUNTER — Other Ambulatory Visit: Payer: Self-pay | Admitting: Internal Medicine

## 2015-03-27 ENCOUNTER — Telehealth: Payer: Self-pay

## 2015-03-27 ENCOUNTER — Other Ambulatory Visit: Payer: Self-pay | Admitting: Internal Medicine

## 2015-03-27 ENCOUNTER — Other Ambulatory Visit: Payer: Self-pay | Admitting: Infectious Diseases

## 2015-03-27 MED ORDER — LISINOPRIL 20 MG PO TABS: 20.0000 mg | ORAL_TABLET | Freq: Every day | ORAL | Status: DC

## 2015-03-27 NOTE — Telephone Encounter (Signed)
Refill request for alprazolam 0.25mg  LOV 02/26/2015. Please advise. Thanks!

## 2015-03-27 NOTE — Telephone Encounter (Signed)
Refill for lisinopril sent into pharmacy. Thanks!  

## 2015-03-29 ENCOUNTER — Encounter: Payer: Self-pay | Admitting: Family Medicine

## 2015-03-29 ENCOUNTER — Ambulatory Visit (INDEPENDENT_AMBULATORY_CARE_PROVIDER_SITE_OTHER): Payer: No Typology Code available for payment source | Admitting: Family Medicine

## 2015-03-29 VITALS — BP 126/89 | HR 59 | Temp 97.6°F | Resp 16 | Ht 64.0 in | Wt 145.0 lb

## 2015-03-29 DIAGNOSIS — R569 Unspecified convulsions: Secondary | ICD-10-CM

## 2015-03-29 DIAGNOSIS — K625 Hemorrhage of anus and rectum: Secondary | ICD-10-CM

## 2015-03-29 MED ORDER — PHENYTOIN SODIUM EXTENDED 100 MG PO CAPS: 100.0000 mg | ORAL_CAPSULE | Freq: Three times a day (TID) | ORAL | Status: DC

## 2015-03-29 MED ORDER — OMEPRAZOLE MAGNESIUM 20 MG PO TBEC: 20.0000 mg | DELAYED_RELEASE_TABLET | Freq: Every day | ORAL | Status: DC

## 2015-03-29 MED ORDER — LEVETIRACETAM 500 MG PO TABS: 500.0000 mg | ORAL_TABLET | Freq: Two times a day (BID) | ORAL | Status: DC

## 2015-03-29 MED ORDER — LISINOPRIL 20 MG PO TABS: 20.0000 mg | ORAL_TABLET | Freq: Every day | ORAL | Status: DC

## 2015-03-29 NOTE — Patient Instructions (Signed)
Continue current treatments We can attempt to get you a referral to [facility] to neurologist Will try to get you in to see a gastroenterologist I have no where to refer you for anxiety

## 2015-03-29 NOTE — Progress Notes (Signed)
Patient ID: Javier Hill, male   DOB: 17-Jun-1981, 34 y.o.   MRN: [id number]   Javier Hill, is a 34 y.o. male  [id number]:[id number]  [id number]  DOB - 1981-08-05  CC:  Chief Complaint  Patient presents with  . Follow-up  . Depression  . Hypertension       HPI: Javier Hill is a 34 y.o. male here today for follow-up hypertension, seizure disorder, anxiety and rectal bleeding. He also has chronic back pain. He is on lisinopril 20 mg daily for hypertension. He is on Keppra 500 mg bid and dilantin 100 tid for seizures. We have attemped a referral to neurologist thru [facility] but there are no neurologist in that system. He was referred to [facility] for his anxiety but cannot be seen there because he has no Orthoptist. He was prescribed Xanax .25 mg tid prn when she was here last but it was explained to him that we cannot prescribe this chronically.  She have also explained that we cannot prescribe narcotics for chronic. Referral attempted to pain clinic but none in [facility].  He also reports today that he is having  Bright red blood from the rectum. He reports not being constipated. He blood is present matter the consistency of the stool. He reports no known history of hemorrhoids. He also is HIV positive and is followed by Dr. Ninetta Hill at [facility].   Allergies  Allergen Reactions  . Caffeine Other (See Comments)    Other reaction(s): Seizures  . Ciprofloxacin Shortness Of Breath  . Penicillins Anaphylaxis  . Sulfamethoxazole-Trimethoprim Anaphylaxis  . Tramadol Other (See Comments)    Seizures   . Acyclovir And Related Hives and Rash  . Haldol [Haloperidol Lactate] Other (See Comments)    Muscle spasms  . Librium [Chlordiazepoxide] Itching  . Aloprim [Allopurinol] Other (See Comments)    unknown  . Ambien [Zolpidem Tartrate] Hives, Anxiety and Other (See Comments)    Insomnia   . Bactrim [Sulfamethoxazole-Trimethoprim] Other (See Comments)  . Other Other  (See Comments)    Other reaction(s): Seizures "Nabenzata" Pt is unsure of what type of antibiotic  . Pneumovax [Pneumococcal Polysaccharide Vaccine] Itching and Rash  . Prevnar [Pneumococcal 13-Val Conj Vacc] Itching and Rash  . Watermelon Flavor Rash  . Zoloft [Sertraline Hcl] Other (See Comments)    unknown   Past Medical History  Diagnosis Date  . Hypertension   . Anxiety epi  . HIV (human immunodeficiency virus infection)   . Seizure     last seizure 09/05/2014  . Epilepsy   . Migraines 2016   Current Outpatient Prescriptions on File Prior to Visit  Medication Sig Dispense Refill  . ALPRAZolam (XANAX) 0.25 MG tablet Take 1 tablet (0.25 mg total) by mouth 3 (three) times daily as needed for anxiety. 30 tablet 0  . anti-nausea (EMETROL) solution Take 10 mLs by mouth every 15 (fifteen) minutes as needed for nausea or vomiting.    . clonazePAM (KLONOPIN) 1 MG tablet Take 1 tablet (1 mg total) by mouth 2 (two) times daily as needed for anxiety. 15 tablet 0  . emtricitabine-tenofovir (TRUVADA) 200-300 MG per tablet Take 1 tablet by mouth daily. 30 tablet 5  . HYDROcodone-acetaminophen (NORCO/VICODIN) 5-325 MG per tablet Take 1-2 tablets by mouth every 6 hours as needed for pain and/or cough. 7 tablet 0  . ISENTRESS 400 MG tablet TAKE 1 TABLET BY MOUTH TWICE DAILY 60 tablet 0  . levETIRAcetam (KEPPRA) 500 MG tablet Take 1 tablet (500 mg  total) by mouth 2 (two) times daily. 60 tablet 0  . lisinopril (PRINIVIL,ZESTRIL) 20 MG tablet Take 1 tablet (20 mg total) by mouth daily. 30 tablet 3  . omeprazole (PRILOSEC OTC) 20 MG tablet Take 20 mg by mouth daily.    . phenytoin (DILANTIN) 100 MG ER capsule Take 1 capsule (100 mg total) by mouth 3 (three) times daily. 90 capsule 0  . raltegravir (ISENTRESS) 400 MG tablet Take 1 tablet (400 mg total) by mouth 2 (two) times daily. 60 tablet 5  . TRUVADA 200-300 MG per tablet TAKE 1 TABLET BY MOUTH EVERY DAY 30 tablet 0  . oxyCODONE (ROXICODONE) 5 MG  immediate release tablet Take 1 tablet (5 mg total) by mouth every 6 (six) hours as needed for severe pain. (Patient not taking: Reported on 02/26/2015) 20 tablet 0  . [DISCONTINUED] diphenhydrAMINE (BENADRYL) 25 MG tablet Take 1 tablet (25 mg total) by mouth every 6 (six) hours. (Patient not taking: Reported on 08/21/2014) 20 tablet 0  . [DISCONTINUED] gabapentin (NEURONTIN) 300 MG capsule Take 1 capsule (300 mg total) by mouth 3 (three) times daily. (Patient not taking: Reported on 10/23/2014) 90 capsule 3  . [DISCONTINUED] omeprazole (PRILOSEC) 40 MG capsule Take 1 capsule (40 mg total) by mouth daily. (Patient not taking: Reported on 10/23/2014) 30 capsule 4  . [DISCONTINUED] promethazine (PHENERGAN) 25 MG tablet Take 1 tablet (25 mg total) by mouth every 6 (six) hours as needed for nausea or vomiting. 20 tablet 0   No current facility-administered medications on file prior to visit.   Family History  Problem Relation Age of Onset  . Adopted: Yes   History   Social History  . Marital Status: Single    Spouse Name: N/A  . Number of Children: N/A  . Years of Education: N/A   Occupational History  . Not on file.   Social History Main Topics  . Smoking status: Current Every Day Smoker -- 0.10 packs/day    Types: Cigarettes  . Smokeless tobacco: Never Used     Comment: cutting back  . Alcohol Use: No  . Drug Use: No  . Sexual Activity:    Partners: Male     Comment: pt. declined condoms   Other Topics Concern  . Not on file   Social History Narrative    Review of Systems: Constitutional: Negative for fever, chills, appetite change, weight loss. Positive for  fatigue. HENT: Negative for ear pain, ear discharge.nose bleeds Eyes: Negative for pain, discharge, redness, itching and visual disturbance.Positive for occassional dry cough Neck: Negative for pain, stiffness Respiratory: Negative for cough. Positive for shortness of breath with anxiety and panic attacks.    Cardiovascular: Negative for chest pain, and leg swelling.Positive for palpitation with anxiety and panic. Gastrointestinal: Negative for abdominal distention, abdominal pain, nausea, vomiting, diarrhea, constipations. Positive for heartburn. Positive for blood in stool Genitourinary: Negative for dysuria, urgency, frequency, hematuria, flank pain,  Musculoskeletal: Negative for back pain, joint pain, joint  swelling, arthralgia and gait problem.Negative for weakness. Neurological: Negative for tremors, syncope,   light-headedness, numbness and headaches. Positive for seizures, positive for dizziness with anxiety. Hematological: Negative for easy bruising or bleeding Psychiatric/Behavioral: Negative for depression, anxiety, decreased concentration, confusion   Objective:   Filed Vitals:   03/29/15 1034  BP: 126/89  Pulse: 59  Temp: 97.6 F (36.4 C)  Resp: 16    Physical Exam: Constitutional: Patient appears well-developed and well-nourished. No distress. HENT: Normocephalic, atraumatic, External right and left ear  normal. Oropharynx is clear and moist.  Eyes: Conjunctivae and EOM are normal. PERRLA, no scleral icterus. Neck: Normal ROM. Neck supple. No lymphadenopathy, No thyromegaly. CVS: RRR, S1/S2 +, no murmurs, no gallops, no rubs Pulmonary: Effort and breath sounds normal, no stridor, rhonchi, wheezes, rales.  Abdominal: Soft. Normoactive BS,, no distension, tenderness, rebound or guarding.  Musculoskeletal: Normal range of motion. No edema and no tenderness.  Neuro: Alert.Normal muscle tone coordination. Non-focal Skin: Skin is warm and dry. No rash noted. Not diaphoretic. No erythema. No pallor. Psychiatric: Appropriate mood and affect. Behavior, judgment, thought content normal.  Lab Results  Component Value Date   WBC 8.3 02/17/2015   HGB 15.6 02/17/2015   HCT 45.5 02/17/2015   MCV 93.4 02/17/2015   PLT 231 02/17/2015   Lab Results  Component Value Date    CREATININE 0.82 02/17/2015   BUN 10 02/17/2015   NA 139 02/17/2015   K 3.7 02/17/2015   CL 105 02/17/2015   CO2 27 02/17/2015    No results found for: HGBA1C Lipid Panel     Component Value Date/Time   CHOL 130 10/23/2014 1634   TRIG 113 10/23/2014 1634   HDL 56 10/23/2014 1634   CHOLHDL 2.3 10/23/2014 1634   VLDL 23 10/23/2014 1634   LDLCALC 51 10/23/2014 1634       Assessment and plan:   Seizure disorder -continue dilantin 100 tid -continue Keppra 500 bid -[facility] if needed  Hypertension, controlled - continue lisinopril 10 mg q day.  Anxiety -Have offered to prescribe SSRI for anxiety but he is not intersted. Say they do not work -Reinforced that we can not manage his anxiety with chronic Xanax use. -Recommened working on getting a Tree surgeons[employer] security number so he can be seen at [facility]  -Chronic back pain - Have explained that we do not prescribe narcotics for chronic pain - have suggested OTC pain medications.  HIV+ - follow-up as planned with Dr. Ninetta LightsHatcher  Rectal bleeding -explained that generally bright red blood is from the rectum due to tears or hemorrhoids -will attempt referral to GI for evaluation of rectal bleeding.   No Follow-up on file.  The patient was given clear instructions to go to ER or return to medical center if symptoms don't improve, worsen or new problems develop. The patient verbalized understanding. The patient was told to call to get lab results if they haven't heard anything in the next week.         Henrietta HooverLinda C. Josetta Wigal, MSN, FNP-BC  03/29/2015, 10:43 AM

## 2015-03-30 LAB — PHENYTOIN LEVEL, TOTAL: Phenytoin Lvl: <2.5 ug/mL — ABNORMAL LOW (ref 10.0–20.0)

## 2015-04-03 ENCOUNTER — Telehealth: Payer: Self-pay

## 2015-04-03 NOTE — Telephone Encounter (Signed)
Refill request for alprazolam 0.25mg . LOV 03/29/2015. Please advise. Thanks!

## 2015-04-03 NOTE — Telephone Encounter (Signed)
Left message for patient to return call regarding labs. Thanks!  

## 2015-04-03 NOTE — Telephone Encounter (Signed)
We do not prescribe Xanax. Will need to see mental health professional about anxiety.

## 2015-04-03 NOTE — Telephone Encounter (Signed)
-----   Message from Henrietta HooverLinda C Bernhardt, NP sent at 04/02/2015  8:29 AM EDT ----- Dilantin level is still low, be sure to take dilantin as directed. Recheck in one month.

## 2015-05-03 ENCOUNTER — Other Ambulatory Visit: Payer: Self-pay | Admitting: *Deleted

## 2015-05-03 ENCOUNTER — Other Ambulatory Visit: Payer: Self-pay | Admitting: Infectious Diseases

## 2015-05-03 DIAGNOSIS — B20 Human immunodeficiency virus [HIV] disease: Secondary | ICD-10-CM

## 2015-05-03 MED ORDER — EMTRICITABINE-TENOFOVIR DF 200-300 MG PO TABS: 1.0000 | ORAL_TABLET | Freq: Every day | ORAL | Status: DC

## 2015-05-03 MED ORDER — RALTEGRAVIR POTASSIUM 400 MG PO TABS: 400.0000 mg | ORAL_TABLET | Freq: Two times a day (BID) | ORAL | Status: DC

## 2015-05-06 ENCOUNTER — Other Ambulatory Visit (INDEPENDENT_AMBULATORY_CARE_PROVIDER_SITE_OTHER): Payer: Self-pay

## 2015-05-06 ENCOUNTER — Ambulatory Visit: Payer: No Typology Code available for payment source

## 2015-05-06 DIAGNOSIS — B20 Human immunodeficiency virus [HIV] disease: Secondary | ICD-10-CM

## 2015-05-06 DIAGNOSIS — Z113 Encounter for screening for infections with a predominantly sexual mode of transmission: Secondary | ICD-10-CM

## 2015-05-06 DIAGNOSIS — Z21 Asymptomatic human immunodeficiency virus [HIV] infection status: Secondary | ICD-10-CM

## 2015-05-06 DIAGNOSIS — Z79899 Other long term (current) drug therapy: Secondary | ICD-10-CM

## 2015-05-06 LAB — LIPID PANEL
CHOL/HDL RATIO: 3.2 ratio (ref <=5.0)
Cholesterol: 134 mg/dL (ref 125–200)
HDL: 42 mg/dL (ref >=40)
LDL Cholesterol: 71 mg/dL (ref <130)
TRIGLYCERIDES: 103 mg/dL (ref <150)
VLDL: 21 mg/dL (ref <30)

## 2015-05-06 LAB — COMPLETE METABOLIC PANEL WITH GFR
ALK PHOS: 103 U/L (ref 40–115)
ALT: 28 U/L (ref 9–46)
AST: 23 U/L (ref 10–40)
Albumin: 4.5 g/dL (ref 3.6–5.1)
BUN: 11 mg/dL (ref 7–25)
CHLORIDE: 103 mmol/L (ref 98–110)
CO2: 28 mmol/L (ref 20–31)
Calcium: 9.4 mg/dL (ref 8.6–10.3)
Creat: 0.67 mg/dL (ref 0.60–1.35)
GFR, Est Non African American: >89 mL/min (ref >=60)
Glucose, Bld: 86 mg/dL (ref 65–99)
POTASSIUM: 4.9 mmol/L (ref 3.5–5.3)
SODIUM: 139 mmol/L (ref 135–146)
TOTAL PROTEIN: 7.4 g/dL (ref 6.1–8.1)
Total Bilirubin: 0.3 mg/dL (ref 0.2–1.2)

## 2015-05-06 LAB — CBC
HCT: 46.1 % (ref 39.0–52.0)
Hemoglobin: 16 g/dL (ref 13.0–17.0)
MCH: 31.9 pg (ref 26.0–34.0)
MCHC: 34.7 g/dL (ref 30.0–36.0)
MCV: 92 fL (ref 78.0–100.0)
MPV: 10 fL (ref 8.6–12.4)
Platelets: 249 10*3/uL (ref 150–400)
RBC: 5.01 MIL/uL (ref 4.22–5.81)
RDW: 13.5 % (ref 11.5–15.5)
WBC: 6.2 10*3/uL (ref 4.0–10.5)

## 2015-05-06 LAB — RPR

## 2015-05-06 NOTE — Addendum Note (Signed)
Addended by: Mariea Clonts D on: 05/06/2015 11:24 AM   Modules accepted: Orders

## 2015-05-07 LAB — T-HELPER CELL (CD4) - (RCID CLINIC ONLY)
CD4 % Helper T Cell: 35 % (ref 33–55)
CD4 T Cell Abs: 850 /uL (ref 400–2700)

## 2015-05-08 LAB — HIV-1 RNA QUANT-NO REFLEX-BLD: HIV-1 RNA Quant, Log: <1.3 {Log} (ref <1.30)

## 2015-05-20 ENCOUNTER — Ambulatory Visit: Payer: Self-pay | Admitting: Infectious Diseases

## 2015-05-24 ENCOUNTER — Telehealth: Payer: Self-pay | Admitting: *Deleted

## 2015-05-24 NOTE — Telephone Encounter (Signed)
Patient called, concerned that "his case manager may be trying to act as INS agent."  He stated he felt uncomfortable with this and that is why he missed his last appointment with Dr. Ninetta Lights.  RN consoled patient, stating that I was sorry he had that experience and stated it was not likely that his case manager could act in that capacity.  RN stated that the case manager is a [employer] employee and offered to have the patient speak with Conception Oms about his concerns.  Patient stated he would, and that he didn't like that "anyone in the position to help the less fortunate would bring politics into anything, especially if they support Trump, which I obviously do not.  Who would?" RN attempted to deflect, agreeing and laughing that "it's never a good idea to bring politics to work." Patient seemed to accept RN's apology for his experience.  I gave him the number to [facility] - [phone].  Patient also wants to speak with Claris Che to schedule dental.  RN rescheduled the patient's no-show with Dr Ninetta Lights for 8/29 at 9:00.  He accepted the appointment, stating his boyfriend would drop him off and he may need assistance getting back home. RN advised we could supply a bus ticket.  Patient's boyfriend then called back and spoke with Miguel Dibble, expressing his concern with the patient's case manager "giving [place] advice for him to leave the country" and upset that the nurse he just spoke with was "laughing and did not believe that the case manager was giving [place] advice to leave the country."  He was also upset that "no one in your office even speaks Spanish."  Ebony apologized for his bad experience, advised that the case manager is not a Producer, television/film/video, and gave him Melinda's name and number.  Patient's boyfriend thanked her and hung up.  Andree Coss, RN

## 2015-05-27 ENCOUNTER — Ambulatory Visit: Payer: No Typology Code available for payment source | Admitting: Infectious Diseases

## 2015-06-06 ENCOUNTER — Other Ambulatory Visit: Payer: Self-pay | Admitting: Family Medicine

## 2015-06-14 ENCOUNTER — Emergency Department (HOSPITAL_BASED_OUTPATIENT_CLINIC_OR_DEPARTMENT_OTHER)
Admission: EM | Admit: 2015-06-14 | Discharge: 2015-06-14 | Discharge status: Against Medical Advice | Payer: No Typology Code available for payment source | Attending: Emergency Medicine | Admitting: Emergency Medicine

## 2015-06-14 ENCOUNTER — Emergency Department (HOSPITAL_BASED_OUTPATIENT_CLINIC_OR_DEPARTMENT_OTHER): Payer: No Typology Code available for payment source

## 2015-06-14 ENCOUNTER — Encounter (HOSPITAL_BASED_OUTPATIENT_CLINIC_OR_DEPARTMENT_OTHER): Payer: Self-pay

## 2015-06-14 DIAGNOSIS — F410 Panic disorder [episodic paroxysmal anxiety] without agoraphobia: Secondary | ICD-10-CM | POA: Insufficient documentation

## 2015-06-14 DIAGNOSIS — G40909 Epilepsy, unspecified, not intractable, without status epilepticus: Secondary | ICD-10-CM | POA: Insufficient documentation

## 2015-06-14 DIAGNOSIS — R0789 Other chest pain: Secondary | ICD-10-CM | POA: Insufficient documentation

## 2015-06-14 DIAGNOSIS — Z21 Asymptomatic human immunodeficiency virus [HIV] infection status: Secondary | ICD-10-CM | POA: Insufficient documentation

## 2015-06-14 DIAGNOSIS — Z720 Tobacco use: Secondary | ICD-10-CM | POA: Insufficient documentation

## 2015-06-14 DIAGNOSIS — G43909 Migraine, unspecified, not intractable, without status migrainosus: Secondary | ICD-10-CM | POA: Insufficient documentation

## 2015-06-14 DIAGNOSIS — I10 Essential (primary) hypertension: Secondary | ICD-10-CM | POA: Insufficient documentation

## 2015-06-14 DIAGNOSIS — Z880 Allergy status to penicillin: Secondary | ICD-10-CM | POA: Insufficient documentation

## 2015-06-14 DIAGNOSIS — Z79899 Other long term (current) drug therapy: Secondary | ICD-10-CM | POA: Insufficient documentation

## 2015-06-14 HISTORY — DX: Panic disorder (episodic paroxysmal anxiety): F41.0

## 2015-06-14 MED ORDER — SODIUM CHLORIDE 0.9 % IV BOLUS (SEPSIS): 1000.0000 mL | Freq: Once | INTRAVENOUS | Status: DC

## 2015-06-14 NOTE — ED Provider Notes (Signed)
CSN: [id number]     Arrival date & time 06/14/15  1727 History   First MD Initiated Contact with Patient 06/14/15 1857     Chief Complaint  Patient presents with  . Back Pain     (Consider location/radiation/quality/duration/timing/severity/associated sxs/prior Treatment) Patient is a 34 y.o. male presenting with back pain and chest pain. The history is provided by the patient. No language interpreter was used.  Back Pain Associated symptoms: chest pain   Chest Pain Chest pain location: left posterior chest wall over scapula. Pain quality: shooting and stabbing   Radiates to: anterior chest. Pain severity:  Moderate Onset quality:  Gradual Duration:  2 weeks Timing:  Intermittent Progression:  Waxing and waning Chronicity:  New Context: breathing   Associated symptoms: back pain, cough, nausea and vomiting   Risk factors: hypertension   Risk factors comment:  HIV   Past Medical History  Diagnosis Date  . Hypertension   . Anxiety epi  . HIV (human immunodeficiency virus infection)   . Seizure     last seizure 09/05/2014  . Epilepsy   . Migraines 2016  . Panic attack    Past Surgical History  Procedure Laterality Date  . Orif clavicular fracture     Family History  Problem Relation Age of Onset  . Adopted: Yes   Social History  Substance Use Topics  . Smoking status: Current Every Day Smoker -- 0.10 packs/day    Types: Cigarettes  . Smokeless tobacco: Never Used     Comment: cutting back  . Alcohol Use: No    Review of Systems  Respiratory: Positive for cough.   Cardiovascular: Positive for chest pain.  Gastrointestinal: Positive for nausea and vomiting.  Musculoskeletal: Positive for back pain.  Allergic/Immunologic: Positive for immunocompromised state.  All other systems reviewed and are negative.     Allergies  Caffeine; Ciprofloxacin; Penicillins; Sulfamethoxazole-trimethoprim; Tramadol; Acyclovir and related; Haldol; Librium; Aloprim; Ambien;  Bactrim; Other; Pneumovax; Prevnar; Watermelon flavor; and Zoloft  Home Medications   Prior to Admission medications   Medication Sig Start Date End Date Taking? Authorizing Provider  anti-nausea (EMETROL) solution Take 10 mLs by mouth every 15 (fifteen) minutes as needed for nausea or vomiting.    Historical Provider, MD  clonazePAM (KLONOPIN) 1 MG tablet Take 1 tablet (1 mg total) by mouth 2 (two) times daily as needed for anxiety. 01/05/15   Rolan Bucco, MD  emtricitabine-tenofovir (TRUVADA) 200-300 MG per tablet Take 1 tablet by mouth daily. 05/03/15   Ginnie Smart, MD  levETIRAcetam (KEPPRA) 500 MG tablet Take 1 tablet (500 mg total) by mouth 2 (two) times daily. 03/29/15   Henrietta Hoover, NP  lisinopril (PRINIVIL,ZESTRIL) 20 MG tablet Take 1 tablet (20 mg total) by mouth daily. 03/29/15   Henrietta Hoover, NP  omeprazole (PRILOSEC OTC) 20 MG tablet Take 1 tablet (20 mg total) by mouth daily. 03/29/15   Henrietta Hoover, NP  oxyCODONE (ROXICODONE) 5 MG immediate release tablet Take 1 tablet (5 mg total) by mouth every 6 (six) hours as needed for severe pain. Patient not taking: Reported on 02/26/2015 12/13/14   Pricilla Loveless, MD  phenytoin (DILANTIN) 100 MG ER capsule Take 1 capsule (100 mg total) by mouth 3 (three) times daily. 03/29/15   Henrietta Hoover, NP  raltegravir (ISENTRESS) 400 MG tablet Take 1 tablet (400 mg total) by mouth 2 (two) times daily. 05/03/15   Ginnie Smart, MD   BP 134/88 mmHg  Pulse 78  Temp(Src) 98.4 F (36.9 C) (Oral)  Resp 18  Ht  (1.626 m)  Wt 140 lb (63.504 kg)  BMI 24.02 kg/m2  SpO2 99% Physical Exam  Constitutional: He is oriented to person, place, and time. He appears well-developed and well-nourished.  HENT:  Head: Normocephalic.  Eyes: Pupils are equal, round, and reactive to light.  Neck: Neck supple.  Cardiovascular: Normal rate and regular rhythm.   Pulmonary/Chest: Effort normal and breath sounds normal.  Abdominal: Soft.   Musculoskeletal: He exhibits no edema.  Lymphadenopathy:    He has no cervical adenopathy.  Neurological: He is alert and oriented to person, place, and time.  Skin: Skin is warm.  Psychiatric: He has a normal mood and affect.  Nursing note and vitals reviewed.   ED Course  Procedures (including critical care time) Labs Review Labs Reviewed  CBC WITH DIFFERENTIAL/PLATELET  BASIC METABOLIC PANEL    Imaging Review Dg Chest 2 View  06/14/2015   CLINICAL DATA:  Left side chest pain with deep breath  EXAM: CHEST  2 VIEW  COMPARISON:  02/17/2015  FINDINGS: Cardiomediastinal silhouette is stable. No acute infiltrate or pleural effusion. No pulmonary edema. Bony thorax is unremarkable.  IMPRESSION: No active cardiopulmonary disease.   Electronically Signed   By: Natasha Mead M.D.   On: 06/14/2015 18:14   I have personally reviewed and evaluated these images and lab results as part of my medical decision-making.   EKG Interpretation   Date/Time:  Friday June 14 2015 17:34:16 EDT Ventricular Rate:  79 PR Interval:  120 QRS Duration: 98 QT Interval:  338 QTC Calculation: 387 R Axis:   64 Text Interpretation:  Normal sinus rhythm Normal ECG No significant change  since last tracing Confirmed by Gwendolyn Grant  MD, BLAIR ([id number]) on 06/14/2015  5:35:56 PM     Radiology results reviewed and shared with patient. Patient apparently decided to leave AMA after being seen and prior to initiation of treatment plan. MDM   Final diagnoses:  None    Posterior chest wall pain.    Felicie Morn, NP 06/14/15 [id number]  Elwin Mocha, MD 06/14/15 [phone]

## 2015-06-14 NOTE — ED Notes (Signed)
Upon entering patient's room, patient states he "just wants to leave". Patient has signed out AMA and is ambulatory at discharge.

## 2015-06-14 NOTE — ED Notes (Signed)
Left posterior upper back pain x week-states yesterday pain with deep breaths-vomiting x 3-4 days

## 2015-06-14 NOTE — ED Notes (Addendum)
Pt states that he has left upper posterior back pain that worsens when he breaths. He states the pain "comes through to the front when I breathe". States this has been happening for 2 weeks but has been getting worse more recently. Pt states that he has been coughing 2-3 days and vomiting for 1 week.

## 2015-06-21 ENCOUNTER — Other Ambulatory Visit: Payer: Self-pay | Admitting: *Deleted

## 2015-06-26 ENCOUNTER — Other Ambulatory Visit: Payer: Self-pay

## 2015-06-26 MED ORDER — LEVETIRACETAM 500 MG PO TABS: 500.0000 mg | ORAL_TABLET | Freq: Two times a day (BID) | ORAL | Status: DC

## 2015-06-27 ENCOUNTER — Telehealth: Payer: Self-pay | Admitting: Family Medicine

## 2015-06-28 NOTE — Telephone Encounter (Signed)
This has been sent in on 06/26/2015. Thanks!

## 2015-07-02 ENCOUNTER — Ambulatory Visit: Payer: No Typology Code available for payment source | Admitting: Family Medicine

## 2015-07-12 ENCOUNTER — Encounter (HOSPITAL_COMMUNITY): Payer: Self-pay | Admitting: Emergency Medicine

## 2015-07-12 ENCOUNTER — Emergency Department (INDEPENDENT_AMBULATORY_CARE_PROVIDER_SITE_OTHER)
Admission: EM | Admit: 2015-07-12 | Discharge: 2015-07-12 | Disposition: A | Discharge status: Home / Self Care | Payer: Self-pay | Source: Home / Self Care

## 2015-07-12 DIAGNOSIS — B349 Viral infection, unspecified: Secondary | ICD-10-CM

## 2015-07-12 LAB — POCT URINALYSIS DIP (DEVICE)
Bilirubin Urine: NEGATIVE
Glucose, UA: NEGATIVE mg/dL
HGB URINE DIPSTICK: NEGATIVE
Ketones, ur: NEGATIVE mg/dL
LEUKOCYTES UA: NEGATIVE
NITRITE: NEGATIVE
Protein, ur: NEGATIVE mg/dL
Specific Gravity, Urine: 1.01 (ref 1.005–1.030)
UROBILINOGEN UA: 0.2 mg/dL (ref 0.0–1.0)
pH: 7 (ref 5.0–8.0)

## 2015-07-12 LAB — POCT I-STAT, CHEM 8
BUN: 5 mg/dL — AB (ref 6–20)
CALCIUM ION: 1.17 mmol/L (ref 1.12–1.23)
Chloride: 102 mmol/L (ref 101–111)
Creatinine, Ser: 0.8 mg/dL (ref 0.61–1.24)
Glucose, Bld: 88 mg/dL (ref 65–99)
HCT: 51 % (ref 39.0–52.0)
HEMOGLOBIN: 17.3 g/dL — AB (ref 13.0–17.0)
Potassium: 4 mmol/L (ref 3.5–5.1)
SODIUM: 143 mmol/L (ref 135–145)
TCO2: 26 mmol/L (ref 0–100)

## 2015-07-12 MED ORDER — ACETAMINOPHEN 500 MG PO TABS: 1000.0000 mg | ORAL_TABLET | Freq: Three times a day (TID) | ORAL | Status: DC | PRN

## 2015-07-12 NOTE — Discharge Instructions (Signed)
Go to the emergency room for further evaluation if you fever returns at greater than 100.4 and you can not reduce it with Tylenol.

## 2015-07-12 NOTE — ED Provider Notes (Signed)
CSN: [id number]     Arrival date & time 07/12/15  1307 History   None    Chief Complaint  Patient presents with  . Influenza   (Consider location/radiation/quality/duration/timing/severity/associated sxs/prior Treatment) Patient is a 34 y.o. male presenting with fever.  Fever Max temp prior to arrival:  106 Temp source:  Oral Onset quality:  Sudden Duration:  12 hours Timing:  Unable to specify Progression:  Improving Chronicity:  New Relieved by:  Ibuprofen and acetaminophen Associated symptoms: chills, congestion, diarrhea and myalgias   Associated symptoms: no chest pain, no confusion, no cough, no dysuria, no ear pain, no headaches, no nausea, no rash, no rhinorrhea, no somnolence, no sore throat and no vomiting   Diarrhea:    Quality:  Unable to specify Myalgias:    Location:  Generalized   Quality:  Aching   Severity:  Moderate   Timing:  Intermittent   Progression:  Partially resolved   Past Medical History  Diagnosis Date  . Hypertension   . Anxiety epi  . HIV (human immunodeficiency virus infection) (HCC)   . Seizure (HCC)     last seizure 09/05/2014  . Epilepsy (HCC)   . Migraines 2016  . Panic attack    Past Surgical History  Procedure Laterality Date  . Orif clavicular fracture     Family History  Problem Relation Age of Onset  . Adopted: Yes   Social History  Substance Use Topics  . Smoking status: Current Every Day Smoker -- 0.10 packs/day    Types: Cigarettes  . Smokeless tobacco: Never Used     Comment: cutting back  . Alcohol Use: No    Review of Systems  Constitutional: Positive for fever and chills.  HENT: Positive for congestion. Negative for ear pain, rhinorrhea and sore throat.   Respiratory: Negative for cough.   Cardiovascular: Negative for chest pain.  Gastrointestinal: Positive for diarrhea. Negative for nausea and vomiting.  Genitourinary: Negative for dysuria.  Musculoskeletal: Positive for myalgias.  Skin: Negative for  rash.  Neurological: Negative for headaches.  Psychiatric/Behavioral: Negative for confusion.    Allergies  Caffeine; Ciprofloxacin; Penicillins; Sulfamethoxazole-trimethoprim; Tramadol; Acyclovir and related; Haldol; Librium; Aloprim; Ambien; Bactrim; Other; Pneumovax; Prevnar; Watermelon flavor; and Zoloft  Home Medications   Prior to Admission medications   Medication Sig Start Date End Date Taking? Authorizing Provider  emtricitabine-tenofovir (TRUVADA) 200-300 MG per tablet Take 1 tablet by mouth daily. 05/03/15  Yes Ginnie Smart, MD  levETIRAcetam (KEPPRA) 500 MG tablet Take 1 tablet (500 mg total) by mouth 2 (two) times daily. 06/26/15  Yes Henrietta Hoover, NP  lisinopril (PRINIVIL,ZESTRIL) 20 MG tablet Take 1 tablet (20 mg total) by mouth daily. 03/29/15  Yes Henrietta Hoover, NP  omeprazole (PRILOSEC OTC) 20 MG tablet Take 1 tablet (20 mg total) by mouth daily. 03/29/15  Yes Henrietta Hoover, NP  phenytoin (DILANTIN) 100 MG ER capsule Take 1 capsule (100 mg total) by mouth 3 (three) times daily. 03/29/15  Yes Henrietta Hoover, NP  raltegravir (ISENTRESS) 400 MG tablet Take 1 tablet (400 mg total) by mouth 2 (two) times daily. 05/03/15  Yes Ginnie Smart, MD  anti-nausea (EMETROL) solution Take 10 mLs by mouth every 15 (fifteen) minutes as needed for nausea or vomiting.    Historical Provider, MD  clonazePAM (KLONOPIN) 1 MG tablet Take 1 tablet (1 mg total) by mouth 2 (two) times daily as needed for anxiety. 01/05/15   Rolan Bucco, MD  oxyCODONE (ROXICODONE) 5  MG immediate release tablet Take 1 tablet (5 mg total) by mouth every 6 (six) hours as needed for severe pain. Patient not taking: Reported on 02/26/2015 12/13/14   Pricilla LovelessScott Goldston, MD   Meds Ordered and Administered this Visit  Medications - No data to display  BP 109/73 mmHg  Pulse 60  Temp(Src) 98.1 F (36.7 C) (Oral)  Resp 16  SpO2 100% Orthostatic VS for the past 24 hrs:  BP- Lying Pulse- Lying BP- Sitting Pulse-  Sitting BP- Standing at 0 minutes Pulse- Standing at 0 minutes  07/12/15 1450 109/63 mmHg 63 114/64 mmHg 72 104/59 mmHg 66    Physical Exam  Constitutional: He is oriented to person, place, and time. He appears well-developed and well-nourished.  HENT:  Head: Normocephalic and atraumatic.  Right Ear: Hearing, tympanic membrane, external ear and ear canal normal.  Left Ear: Hearing, tympanic membrane, external ear and ear canal normal.  Nose: Nose normal.  Mouth/Throat: Uvula is midline, oropharynx is clear and moist and mucous membranes are normal.  Eyes: Conjunctivae, EOM and lids are normal. Pupils are equal, round, and reactive to light. Right eye exhibits no discharge. Left eye exhibits no discharge. No scleral icterus.  Neck: Trachea normal and normal range of motion. Neck supple. Carotid bruit is not present.  Cardiovascular: Normal rate, regular rhythm, normal heart sounds, intact distal pulses and normal pulses.   No murmur heard. Pulmonary/Chest: Effort normal and breath sounds normal. No respiratory distress. He has no wheezes. He has no rhonchi. He has no rales.  Abdominal: Soft. Normal appearance and bowel sounds are normal. He exhibits no abdominal bruit. There is no tenderness.  Musculoskeletal: Normal range of motion. He exhibits no edema or tenderness.  Lymphadenopathy:       Head (right side): No submental, no submandibular, no tonsillar, no preauricular, no posterior auricular and no occipital adenopathy present.       Head (left side): No submental, no submandibular, no tonsillar, no preauricular, no posterior auricular and no occipital adenopathy present.    He has no cervical adenopathy.  Neurological: He is alert and oriented to person, place, and time. He has normal strength and normal reflexes. No cranial nerve deficit or sensory deficit. Coordination and gait normal.  Skin: Skin is warm, dry and intact. No lesion and no rash noted.  Psychiatric: He has a normal mood  and affect. His speech is normal and behavior is normal. Judgment and thought content normal.    ED Course  Procedures (including critical care time)  Labs Review Labs Reviewed  POCT I-STAT, CHEM 8 - Abnormal; Notable for the following:    BUN 5 (*)    Hemoglobin 17.3 (*)    All other components within normal limits  POCT URINALYSIS DIP (DEVICE)    Lab Results  Component Value Date   HIV1RNAQUANT <20 05/06/2015   Lab Results  Component Value Date   CD4TCELL 35 05/06/2015   CD4TABS 850 05/06/2015     Imaging Review No results found.      MDM   1. Acute viral syndrome    Go to ED if symptoms change.  Will treat with Ibuprofen for now.      Ofilia NeasMichael L Ader Fritze, PA-C 07/12/15 1536

## 2015-07-12 NOTE — ED Notes (Signed)
Pt is here today complaining of flu-like symptoms for the last three days.  Pt reports a fever between 106-107 last night, and complains of generalized body aches.  Pt has no transportation and was unable to come in any sooner.

## 2015-07-23 ENCOUNTER — Encounter (HOSPITAL_COMMUNITY): Payer: Self-pay | Admitting: Emergency Medicine

## 2015-07-23 ENCOUNTER — Emergency Department (INDEPENDENT_AMBULATORY_CARE_PROVIDER_SITE_OTHER)
Admission: EM | Admit: 2015-07-23 | Discharge: 2015-07-23 | Disposition: A | Discharge status: Home / Self Care | Payer: Self-pay | Source: Home / Self Care | Attending: Emergency Medicine | Admitting: Emergency Medicine

## 2015-07-23 DIAGNOSIS — M791 Myalgia, unspecified site: Secondary | ICD-10-CM

## 2015-07-23 DIAGNOSIS — J302 Other seasonal allergic rhinitis: Secondary | ICD-10-CM

## 2015-07-23 MED ORDER — DICLOFENAC SODIUM 1 % TD GEL: 1.0000 "application " | Freq: Four times a day (QID) | TRANSDERMAL | Status: DC

## 2015-07-23 NOTE — ED Provider Notes (Signed)
CSN: [id number]     Arrival date & time 07/23/15  1745 History   First MD Initiated Contact with Patient 07/23/15 1810     Chief Complaint  Patient presents with  . Back Pain  . URI   (Consider location/radiation/quality/duration/timing/severity/associated sxs/prior Treatment) HPI Comments: 34 year old Hispanic male complaining of painful knots on his back. He says the pain in these areas are exacerbated by movement such as lifting. They are scattered about his back bilaterally. He has had these for at least a month. He has been to the emergency department and the urgent care for this complaint and has been prescribed several medicines including Norco. None of these seem to help his back. He has not seen his PCP. He also complains of persistent allergy type symptoms such as sore throat, PND and cough. He is taking OTC medications which have helped in the past but not in the past few days.   Past Medical History  Diagnosis Date  . Hypertension   . Anxiety epi  . HIV (human immunodeficiency virus infection) (HCC)   . Seizure (HCC)     last seizure 09/05/2014  . Epilepsy (HCC)   . Migraines 2016  . Panic attack    Past Surgical History  Procedure Laterality Date  . Orif clavicular fracture     Family History  Problem Relation Age of Onset  . Adopted: Yes   Social History  Substance Use Topics  . Smoking status: Current Every Day Smoker -- 0.10 packs/day    Types: Cigarettes  . Smokeless tobacco: Never Used     Comment: cutting back  . Alcohol Use: No    Review of Systems  Constitutional: Positive for activity change. Negative for fever, diaphoresis and fatigue.  HENT: Positive for postnasal drip and sore throat. Negative for congestion, ear pain, facial swelling, rhinorrhea and trouble swallowing.   Eyes: Negative for pain, discharge and redness.  Respiratory: Positive for cough. Negative for chest tightness and shortness of breath.   Cardiovascular: Negative.    Gastrointestinal: Negative.   Musculoskeletal: Positive for myalgias and back pain. Negative for neck pain and neck stiffness.  Skin: Negative.   Neurological: Negative.     Allergies  Caffeine; Ciprofloxacin; Penicillins; Sulfamethoxazole-trimethoprim; Tramadol; Acyclovir and related; Haldol; Librium; Aloprim; Ambien; Bactrim; Other; Pneumovax; Prevnar; Watermelon flavor; and Zoloft  Home Medications   Prior to Admission medications   Medication Sig Start Date End Date Taking? Authorizing Provider  emtricitabine-tenofovir (TRUVADA) 200-300 MG per tablet Take 1 tablet by mouth daily. 05/03/15  Yes Ginnie SmartJeffrey C Hatcher, MD  levETIRAcetam (KEPPRA) 500 MG tablet Take 1 tablet (500 mg total) by mouth 2 (two) times daily. 06/26/15  Yes Henrietta HooverLinda C Bernhardt, NP  lisinopril (PRINIVIL,ZESTRIL) 20 MG tablet Take 1 tablet (20 mg total) by mouth daily. 03/29/15  Yes Henrietta HooverLinda C Bernhardt, NP  phenytoin (DILANTIN) 100 MG ER capsule Take 1 capsule (100 mg total) by mouth 3 (three) times daily. 03/29/15  Yes Henrietta HooverLinda C Bernhardt, NP  raltegravir (ISENTRESS) 400 MG tablet Take 1 tablet (400 mg total) by mouth 2 (two) times daily. 05/03/15  Yes Ginnie SmartJeffrey C Hatcher, MD  acetaminophen (TYLENOL) 500 MG tablet Take 2 tablets (1,000 mg total) by mouth every 8 (eight) hours as needed. Take 2 tabs every 8 hours. 07/12/15   Ofilia NeasMichael L Clark, PA-C  anti-nausea (EMETROL) solution Take 10 mLs by mouth every 15 (fifteen) minutes as needed for nausea or vomiting.    Historical Provider, MD  clonazePAM (KLONOPIN) 1 MG tablet  Take 1 tablet (1 mg total) by mouth 2 (two) times daily as needed for anxiety. 01/05/15   Rolan Bucco, MD  diclofenac sodium (VOLTAREN) 1 % GEL Apply 1 application topically 4 (four) times daily. 07/23/15   Hayden Rasmussen, NP  omeprazole (PRILOSEC OTC) 20 MG tablet Take 1 tablet (20 mg total) by mouth daily. 03/29/15   Henrietta Hoover, NP   Meds Ordered and Administered this Visit  Medications - No data to display  BP 119/80  mmHg  Pulse 58  Temp(Src) 98.4 F (36.9 C) (Oral)  Resp 16  SpO2 100% No data found.   Physical Exam  Constitutional: He is oriented to person, place, and time. He appears well-developed and well-nourished. No distress.  HENT:  Oropharynx with minor erythema and copious amount of clear thick PND. No exudates.  Eyes: EOM are normal.  Neck: Normal range of motion. Neck supple.  Cardiovascular: Normal rate, regular rhythm and normal heart sounds.   Pulmonary/Chest: Effort normal and breath sounds normal. No respiratory distress.  Musculoskeletal: Normal range of motion. He exhibits no edema.  There are various areas of tenderness over the mid and upper back muscles. I am unable to palpate any nodules, knots or other lesions. I am able to palpate. There is a muscles that are tender. There are no overlying skin changes such as discoloration or swelling. No pain or tenderness to the spine.  Lymphadenopathy:    He has no cervical adenopathy.  Neurological: He is alert and oriented to person, place, and time.  Skin: Skin is warm and dry. No rash noted.  Psychiatric: He has a normal mood and affect.  Nursing note and vitals reviewed.   ED Course  Procedures (including critical care time)  Labs Review Labs Reviewed - No data to display  Imaging Review No results found.   Visual Acuity Review  Right Eye Distance:   Left Eye Distance:   Bilateral Distance:    Right Eye Near:   Left Eye Near:    Bilateral Near:         MDM   1. Myalgia   2. Other seasonal allergic rhinitis    For drainage may take 1 of the 3 nondrowsy formulations including Claritin, Zyrtec or Allegra. For stronger medication that may cause drowsiness may take chlorpheniramine or brompheniramine. These medications are over-the-counter. 4 nasal congestion may take Sudafed PE 10 mg. Diclofenac gel to the back. Massage may help. Follow-up with your primary care doctor as needed.   Hayden Rasmussen,  NP 07/23/15 [phone]

## 2015-07-23 NOTE — ED Notes (Signed)
C/o knots on back onset 3 weeks ++ causing pain; 7/10 Pain increases w/activity  Also c/o persistent cold sx  Sx include ST, dry cough, fevers, chills and vomiting  A&O x4... No acute distress.

## 2015-07-23 NOTE — Discharge Instructions (Signed)
Allergic Rhinitis For drainage may take 1 of the 3 nondrowsy formulations including Claritin, Zyrtec or Allegra. For stronger medication that may cause drowsiness may take chlorpheniramine or brompheniramine. These medications are over-the-counter. 4 nasal congestion may take Sudafed PE 10 mg.  Allergic rhinitis is when the mucous membranes in the nose respond to allergens. Allergens are particles in the air that cause your body to have an allergic reaction. This causes you to release allergic antibodies. Through a chain of events, these eventually cause you to release histamine into the blood stream. Although meant to protect the body, it is this release of histamine that causes your discomfort, such as frequent sneezing, congestion, and an itchy, runny nose.  CAUSES Seasonal allergic rhinitis (hay fever) is caused by pollen allergens that may come from grasses, trees, and weeds. Year-round allergic rhinitis (perennial allergic rhinitis) is caused by allergens such as house dust mites, pet dander, and mold spores. SYMPTOMS  Nasal stuffiness (congestion).  Itchy, runny nose with sneezing and tearing of the eyes. DIAGNOSIS Your health care provider can help you determine the allergen or allergens that trigger your symptoms. If you and your health care provider are unable to determine the allergen, skin or blood testing may be used. Your health care provider will diagnose your condition after taking your health history and performing a physical exam. Your health care provider may assess you for other related conditions, such as asthma, pink eye, or an ear infection. TREATMENT Allergic rhinitis does not have a cure, but it can be controlled by:  Medicines that block allergy symptoms. These may include allergy shots, nasal sprays, and oral antihistamines.  Avoiding the allergen. Hay fever may often be treated with antihistamines in pill or nasal spray forms. Antihistamines block the effects of  histamine. There are over-the-counter medicines that may help with nasal congestion and swelling around the eyes. Check with your health care provider before taking or giving this medicine. If avoiding the allergen or the medicine prescribed do not work, there are many new medicines your health care provider can prescribe. Stronger medicine may be used if initial measures are ineffective. Desensitizing injections can be used if medicine and avoidance does not work. Desensitization is when a patient is given ongoing shots until the body becomes less sensitive to the allergen. Make sure you follow up with your health care provider if problems continue. HOME CARE INSTRUCTIONS It is not possible to completely avoid allergens, but you can reduce your symptoms by taking steps to limit your exposure to them. It helps to know exactly what you are allergic to so that you can avoid your specific triggers. SEEK MEDICAL CARE IF:  You have a fever.  You develop a cough that does not stop easily (persistent).  You have shortness of breath.  You start wheezing.  Symptoms interfere with normal daily activities.   This information is not intended to replace advice given to you by your health care provider. Make sure you discuss any questions you have with your health care provider.   Document Released: 06/09/2001 Document Revised: 10/05/2014 Document Reviewed: 05/22/2013 Elsevier Interactive Patient Education 2016 [employer]For drainage may take

## 2015-08-18 ENCOUNTER — Encounter (HOSPITAL_COMMUNITY): Payer: Self-pay | Admitting: *Deleted

## 2015-08-18 ENCOUNTER — Emergency Department (HOSPITAL_COMMUNITY): Payer: Self-pay

## 2015-08-18 ENCOUNTER — Emergency Department (HOSPITAL_COMMUNITY)
Admission: EM | Admit: 2015-08-18 | Discharge: 2015-08-18 | Disposition: A | Discharge status: Home / Self Care | Payer: Self-pay | Attending: Emergency Medicine | Admitting: Emergency Medicine

## 2015-08-18 DIAGNOSIS — M549 Dorsalgia, unspecified: Secondary | ICD-10-CM

## 2015-08-18 DIAGNOSIS — R05 Cough: Secondary | ICD-10-CM | POA: Insufficient documentation

## 2015-08-18 DIAGNOSIS — I10 Essential (primary) hypertension: Secondary | ICD-10-CM | POA: Insufficient documentation

## 2015-08-18 DIAGNOSIS — G40909 Epilepsy, unspecified, not intractable, without status epilepticus: Secondary | ICD-10-CM | POA: Insufficient documentation

## 2015-08-18 DIAGNOSIS — Z79899 Other long term (current) drug therapy: Secondary | ICD-10-CM | POA: Insufficient documentation

## 2015-08-18 DIAGNOSIS — F410 Panic disorder [episodic paroxysmal anxiety] without agoraphobia: Secondary | ICD-10-CM | POA: Insufficient documentation

## 2015-08-18 DIAGNOSIS — X58XXXA Exposure to other specified factors, initial encounter: Secondary | ICD-10-CM | POA: Insufficient documentation

## 2015-08-18 DIAGNOSIS — R112 Nausea with vomiting, unspecified: Secondary | ICD-10-CM | POA: Insufficient documentation

## 2015-08-18 DIAGNOSIS — R0981 Nasal congestion: Secondary | ICD-10-CM | POA: Insufficient documentation

## 2015-08-18 DIAGNOSIS — S6992XA Unspecified injury of left wrist, hand and finger(s), initial encounter: Secondary | ICD-10-CM | POA: Insufficient documentation

## 2015-08-18 DIAGNOSIS — R51 Headache: Secondary | ICD-10-CM | POA: Insufficient documentation

## 2015-08-18 DIAGNOSIS — Y92009 Unspecified place in unspecified non-institutional (private) residence as the place of occurrence of the external cause: Secondary | ICD-10-CM | POA: Insufficient documentation

## 2015-08-18 DIAGNOSIS — Z880 Allergy status to penicillin: Secondary | ICD-10-CM | POA: Insufficient documentation

## 2015-08-18 DIAGNOSIS — Y998 Other external cause status: Secondary | ICD-10-CM | POA: Insufficient documentation

## 2015-08-18 DIAGNOSIS — B20 Human immunodeficiency virus [HIV] disease: Secondary | ICD-10-CM | POA: Insufficient documentation

## 2015-08-18 DIAGNOSIS — Y9389 Activity, other specified: Secondary | ICD-10-CM | POA: Insufficient documentation

## 2015-08-18 DIAGNOSIS — M545 Low back pain: Secondary | ICD-10-CM | POA: Insufficient documentation

## 2015-08-18 DIAGNOSIS — F17210 Nicotine dependence, cigarettes, uncomplicated: Secondary | ICD-10-CM | POA: Insufficient documentation

## 2015-08-18 DIAGNOSIS — R519 Headache, unspecified: Secondary | ICD-10-CM

## 2015-08-18 DIAGNOSIS — B370 Candidal stomatitis: Secondary | ICD-10-CM | POA: Insufficient documentation

## 2015-08-18 LAB — CBC WITH DIFFERENTIAL/PLATELET
BASOS ABS: 0 10*3/uL (ref 0.0–0.1)
BASOS PCT: 0 %
EOS PCT: 0 %
Eosinophils Absolute: 0 10*3/uL (ref 0.0–0.7)
HCT: 45.7 % (ref 39.0–52.0)
Hemoglobin: 15.8 g/dL (ref 13.0–17.0)
Lymphocytes Relative: 10 %
Lymphs Abs: 1.4 10*3/uL (ref 0.7–4.0)
MCH: 32.3 pg (ref 26.0–34.0)
MCHC: 34.6 g/dL (ref 30.0–36.0)
MCV: 93.5 fL (ref 78.0–100.0)
MONO ABS: 1.1 10*3/uL — AB (ref 0.1–1.0)
Monocytes Relative: 8 %
NEUTROS ABS: 11.4 10*3/uL — AB (ref 1.7–7.7)
Neutrophils Relative %: 82 %
PLATELETS: 201 10*3/uL (ref 150–400)
RBC: 4.89 MIL/uL (ref 4.22–5.81)
RDW: 12.8 % (ref 11.5–15.5)
WBC: 13.9 10*3/uL — ABNORMAL HIGH (ref 4.0–10.5)

## 2015-08-18 LAB — BASIC METABOLIC PANEL
ANION GAP: 7 (ref 5–15)
BUN: 11 mg/dL (ref 6–20)
CALCIUM: 8.9 mg/dL (ref 8.9–10.3)
CO2: 26 mmol/L (ref 22–32)
Chloride: 101 mmol/L (ref 101–111)
Creatinine, Ser: 0.94 mg/dL (ref 0.61–1.24)
Glucose, Bld: 104 mg/dL — ABNORMAL HIGH (ref 65–99)
Potassium: 4 mmol/L (ref 3.5–5.1)
SODIUM: 134 mmol/L — AB (ref 135–145)

## 2015-08-18 LAB — RAPID STREP SCREEN (MED CTR MEBANE ONLY): Streptococcus, Group A Screen (Direct): NEGATIVE

## 2015-08-18 MED ORDER — SODIUM CHLORIDE 0.9 % IV BOLUS (SEPSIS)
1000.0000 mL | Freq: Once | INTRAVENOUS | Status: AC
Start: 2015-08-18 — End: 2015-08-18
  Administered 2015-08-18: 1000 mL via INTRAVENOUS

## 2015-08-18 MED ORDER — ACETAMINOPHEN 500 MG PO TABS: 500.0000 mg | ORAL_TABLET | Freq: Four times a day (QID) | ORAL | Status: DC | PRN

## 2015-08-18 MED ORDER — SODIUM CHLORIDE 0.9 % IV BOLUS (SEPSIS)
1000.0000 mL | Freq: Once | INTRAVENOUS | Status: AC
  Administered 2015-08-18: 1000 mL via INTRAVENOUS

## 2015-08-18 MED ORDER — KETOROLAC TROMETHAMINE 30 MG/ML IJ SOLN
30.0000 mg | Freq: Once | INTRAMUSCULAR | Status: AC
  Administered 2015-08-18: 30 mg via INTRAVENOUS
  Filled 2015-08-18: qty 1

## 2015-08-18 MED ORDER — METHOCARBAMOL 500 MG PO TABS: 500.0000 mg | ORAL_TABLET | Freq: Two times a day (BID) | ORAL | Status: DC

## 2015-08-18 MED ORDER — NYSTATIN 100000 UNIT/ML MT SUSP: 500000.0000 [IU] | Freq: Four times a day (QID) | OROMUCOSAL | Status: DC

## 2015-08-18 MED ORDER — METOCLOPRAMIDE HCL 5 MG/ML IJ SOLN
10.0000 mg | Freq: Once | INTRAMUSCULAR | Status: AC
  Administered 2015-08-18: 10 mg via INTRAVENOUS
  Filled 2015-08-18: qty 2

## 2015-08-18 MED ORDER — DIPHENHYDRAMINE HCL 50 MG/ML IJ SOLN
12.5000 mg | Freq: Once | INTRAMUSCULAR | Status: AC
  Administered 2015-08-18: 12.5 mg via INTRAVENOUS
  Filled 2015-08-18: qty 1

## 2015-08-18 NOTE — Discharge Instructions (Signed)
1. Medications: nystatin mouth wash, tylenol and robaxin for back pain, usual home medications 2. Treatment: rest, drink plenty of fluids 3. Follow Up: please followup with your primary doctor this week for discussion of your diagnoses and further evaluation after today's visit; please followup with orthopedics for further evaluation and management of left finger injury; if you do not have a primary care doctor use the resource guide provided to find one; please return to the ER for severe pain, chest pain, shortness of breath, loss of control of your bowel or bladder, numbness, weakness, new or worsening symptoms   Back Exercises The following exercises strengthen the muscles that help to support the back. They also help to keep the lower back flexible. Doing these exercises can help to prevent back pain or lessen existing pain. If you have back pain or discomfort, try doing these exercises 2-3 times each day or as told by your health care provider. When the pain goes away, do them once each day, but increase the number of times that you repeat the steps for each exercise (do more repetitions). If you do not have back pain or discomfort, do these exercises once each day or as told by your health care provider. EXERCISES Single Knee to Chest Repeat these steps 3-5 times for each leg:  Lie on your back on a firm bed or the floor with your legs extended.  Bring one knee to your chest. Your other leg should stay extended and in contact with the floor.  Hold your knee in place by grabbing your knee or thigh.  Pull on your knee until you feel a gentle stretch in your lower back.  Hold the stretch for 10-30 seconds.  Slowly release and straighten your leg. Pelvic Tilt Repeat these steps 5-10 times:  Lie on your back on a firm bed or the floor with your legs extended.  Bend your knees so they are pointing toward the ceiling and your feet are flat on the floor.  Tighten your lower abdominal  muscles to press your lower back against the floor. This motion will tilt your pelvis so your tailbone points up toward the ceiling instead of pointing to your feet or the floor.  With gentle tension and even breathing, hold this position for 5-10 seconds. Cat-Cow Repeat these steps until your lower back becomes more flexible:  Get into a hands-and-knees position on a firm surface. Keep your hands under your shoulders, and keep your knees under your hips. You may place padding under your knees for comfort.  Let your head hang down, and point your tailbone toward the floor so your lower back becomes rounded like the back of a cat.  Hold this position for 5 seconds.  Slowly lift your head and point your tailbone up toward the ceiling so your back forms a sagging arch like the back of a cow.  Hold this position for 5 seconds. Press-Ups Repeat these steps 5-10 times:  Lie on your abdomen (face-down) on the floor.  Place your palms near your head, about shoulder-width apart.  While you keep your back as relaxed as possible and keep your hips on the floor, slowly straighten your arms to raise the top half of your body and lift your shoulders. Do not use your back muscles to raise your upper torso. You may adjust the placement of your hands to make yourself more comfortable.  Hold this position for 5 seconds while you keep your back relaxed.  Slowly return to  lying flat on the floor. Bridges Repeat these steps 10 times:  Lie on your back on a firm surface.  Bend your knees so they are pointing toward the ceiling and your feet are flat on the floor.  Tighten your buttocks muscles and lift your buttocks off of the floor until your waist is at almost the same height as your knees. You should feel the muscles working in your buttocks and the back of your thighs. If you do not feel these muscles, slide your feet 1-2 inches farther away from your buttocks.  Hold this position for 3-5  seconds.  Slowly lower your hips to the starting position, and allow your buttocks muscles to relax completely. If this exercise is too easy, try doing it with your arms crossed over your chest. Abdominal Crunches Repeat these steps 5-10 times:  Lie on your back on a firm bed or the floor with your legs extended.  Bend your knees so they are pointing toward the ceiling and your feet are flat on the floor.  Cross your arms over your chest.  Tip your chin slightly toward your chest without bending your neck.  Tighten your abdominal muscles and slowly raise your trunk (torso) high enough to lift your shoulder blades a tiny bit off of the floor. Avoid raising your torso higher than that, because it can put too much stress on your low back and it does not help to strengthen your abdominal muscles.  Slowly return to your starting position. Back Lifts Repeat these steps 5-10 times:  Lie on your abdomen (face-down) with your arms at your sides, and rest your forehead on the floor.  Tighten the muscles in your legs and your buttocks.  Slowly lift your chest off of the floor while you keep your hips pressed to the floor. Keep the back of your head in line with the curve in your back. Your eyes should be looking at the floor.  Hold this position for 3-5 seconds.  Slowly return to your starting position. SEEK MEDICAL CARE IF:  Your back pain or discomfort gets much worse when you do an exercise.  Your back pain or discomfort does not lessen within 2 hours after you exercise. If you have any of these problems, stop doing these exercises right away. Do not do them again unless your health care provider says that you can. SEEK IMMEDIATE MEDICAL CARE IF:  You develop sudden, severe back pain. If this happens, stop doing the exercises right away. Do not do them again unless your health care provider says that you can.   This information is not intended to replace advice given to you by your  health care provider. Make sure you discuss any questions you have with your health care provider.   Document Released: 10/22/2004 Document Revised: 06/05/2015 Document Reviewed: 11/08/2014 Elsevier Interactive Patient Education 2016 Elsevier Inc.  Back Pain, Adult Back pain is very common in adults.The cause of back pain is rarely dangerous and the pain often gets better over time.The cause of your back pain may not be known. Some common causes of back pain include:  Strain of the muscles or ligaments supporting the spine.  Wear and tear (degeneration) of the spinal disks.  Arthritis.  Direct injury to the back. For many people, back pain may return. Since back pain is rarely dangerous, most people can learn to manage this condition on their own. HOME CARE INSTRUCTIONS Watch your back pain for any changes. The following actions may  help to lessen any discomfort you are feeling:  Remain active. It is stressful on your back to sit or stand in one place for long periods of time. Do not sit, drive, or stand in one place for more than 30 minutes at a time. Take short walks on even surfaces as soon as you are able.Try to increase the length of time you walk each day.  Exercise regularly as directed by your health care provider. Exercise helps your back heal faster. It also helps avoid future injury by keeping your muscles strong and flexible.  Do not stay in bed.Resting more than 1-2 days can delay your recovery.  Pay attention to your body when you bend and lift. The most comfortable positions are those that put less stress on your recovering back. Always use proper lifting techniques, including:  Bending your knees.  Keeping the load close to your body.  Avoiding twisting.  Find a comfortable position to sleep. Use a firm mattress and lie on your side with your knees slightly bent. If you lie on your back, put a pillow under your knees.  Avoid feeling anxious or stressed.Stress  increases muscle tension and can worsen back pain.It is important to recognize when you are anxious or stressed and learn ways to manage it, such as with exercise.  Take medicines only as directed by your health care provider. Over-the-counter medicines to reduce pain and inflammation are often the most helpful.Your health care provider may prescribe muscle relaxant drugs.These medicines help dull your pain so you can more quickly return to your normal activities and healthy exercise.  Apply ice to the injured area:  Put ice in a plastic bag.  Place a towel between your skin and the bag.  Leave the ice on for 20 minutes, 2-3 times a day for the first 2-3 days. After that, ice and heat may be alternated to reduce pain and spasms.  Maintain a healthy weight. Excess weight puts extra stress on your back and makes it difficult to maintain good posture. SEEK MEDICAL CARE IF:  You have pain that is not relieved with rest or medicine.  You have increasing pain going down into the legs or buttocks.  You have pain that does not improve in one week.  You have night pain.  You lose weight.  You have a fever or chills. SEEK IMMEDIATE MEDICAL CARE IF:   You develop new bowel or bladder control problems.  You have unusual weakness or numbness in your arms or legs.  You develop nausea or vomiting.  You develop abdominal pain.  You feel faint.   This information is not intended to replace advice given to you by your health care provider. Make sure you discuss any questions you have with your health care provider.   Document Released: 09/14/2005 Document Revised: 10/05/2014 Document Reviewed: 01/16/2014 Elsevier Interactive Patient Education 2016 [employer].   Migraine Headache A migraine headache is an intense, throbbing pain on one or both sides of your head. A migraine can last for 30 minutes to several hours. CAUSES  The exact cause of a migraine headache is not always known.  However, a migraine may be caused when nerves in the brain become irritated and release chemicals that cause inflammation. This causes pain. Certain things may also trigger migraines, such as:  Alcohol.  Smoking.  Stress.  Menstruation.  Aged cheeses.  Foods or drinks that contain nitrates, glutamate, aspartame, or tyramine.  Lack of sleep.  Chocolate.  Caffeine.  Hunger.  Physical exertion.  Fatigue.  Medicines used to treat chest pain (nitroglycerine), birth control pills, estrogen, and some blood pressure medicines. SIGNS AND SYMPTOMS  Pain on one or both sides of your head.  Pulsating or throbbing pain.  Severe pain that prevents daily activities.  Pain that is aggravated by any physical activity.  Nausea, vomiting, or both.  Dizziness.  Pain with exposure to bright lights, loud noises, or activity.  General sensitivity to bright lights, loud noises, or smells. Before you get a migraine, you may get warning signs that a migraine is coming (aura). An aura may include:  Seeing flashing lights.  Seeing bright spots, halos, or zigzag lines.  Having tunnel vision or blurred vision.  Having feelings of numbness or tingling.  Having trouble talking.  Having muscle weakness. DIAGNOSIS  A migraine headache is often diagnosed based on:  Symptoms.  Physical exam.  A CT scan or MRI of your head. These imaging tests cannot diagnose migraines, but they can help rule out other causes of headaches. TREATMENT Medicines may be given for pain and nausea. Medicines can also be given to help prevent recurrent migraines.  HOME CARE INSTRUCTIONS  Only take over-the-counter or prescription medicines for pain or discomfort as directed by your health care provider. The use of long-term narcotics is not recommended.  Lie down in a dark, quiet room when you have a migraine.  Keep a journal to find out what may trigger your migraine headaches. For example, write  down:  What you eat and drink.  How much sleep you get.  Any change to your diet or medicines.  Limit alcohol consumption.  Quit smoking if you smoke.  Get 7-9 hours of sleep, or as recommended by your health care provider.  Limit stress.  Keep lights dim if bright lights bother you and make your migraines worse. SEEK IMMEDIATE MEDICAL CARE IF:   Your migraine becomes severe.  You have a fever.  You have a stiff neck.  You have vision loss.  You have muscular weakness or loss of muscle control.  You start losing your balance or have trouble walking.  You feel faint or pass out.  You have severe symptoms that are different from your first symptoms. MAKE SURE YOU:   Understand these instructions.  Will watch your condition.  Will get help right away if you are not doing well or get worse.   This information is not intended to replace advice given to you by your health care provider. Make sure you discuss any questions you have with your health care provider.   Document Released: 09/14/2005 Document Revised: 10/05/2014 Document Reviewed: 05/22/2013 Elsevier Interactive Patient Education 2016 [employer].  Colstrip, Adult Ginette Pitman, also called oral candidiasis, is a fungal infection that develops in the mouth and throat and on the tongue. It causes white patches to form on the mouth and tongue. Ginette Pitman is most common in older adults, but it can occur at any age.  Many cases of thrush are mild, but this infection can also be more serious. Ginette Pitman can be a recurring problem for people who have chronic illnesses or who take medicines that limit the body's ability to fight infection. Because these people have difficulty fighting infections, the fungus that causes thrush can spread throughout the body. This can cause life-threatening blood or organ infections. CAUSES  Ginette Pitman is usually caused by a yeast called Candida albicans. This fungus is normally present in small amounts in  the mouth and  on other mucous membranes. It usually causes no harm. However, when conditions are present that allow the fungus to grow uncontrolled, it invades surrounding tissues and becomes an infection. Less often, other Candida species can also lead to thrush.  RISK FACTORS Ginette Pitman is more likely to develop in the following people:  People with an impaired ability to fight infection (weakened immune system).   Older adults.   People with HIV.   People with diabetes.   People with dry mouth (xerostomia).   Pregnant women.   People with poor dental care, especially those who have false teeth.   People who use antibiotic medicines.  SIGNS AND SYMPTOMS  Ginette Pitman can be a mild infection that causes no symptoms. If symptoms develop, they may include:   A burning feeling in the mouth and throat. This can occur at the start of a thrush infection.   White patches that adhere to the mouth and tongue. The tissue around the patches may be red, raw, and painful. If rubbed (during tooth brushing, for example), the patches and the tissue of the mouth may bleed easily.   A bad taste in the mouth or difficulty tasting foods.   Cottony feeling in the mouth.   Pain during eating and swallowing. DIAGNOSIS  Your health care provider can usually diagnose thrush by looking in your mouth and asking you questions about your health.  TREATMENT  Medicines that help prevent the growth of fungi (antifungals) are the standard treatment for thrush. These medicines are either applied directly to the affected area (topical) or swallowed (oral). The treatment will depend on the severity of the condition.  Mild Thrush Mild cases of thrush may clear up with the use of an antifungal mouth rinse or lozenges. Treatment usually lasts about 14 days.  Moderate to Severe Thrush  More severe thrush infections that have spread to the esophagus are treated with an oral antifungal medicine. A topical antifungal  medicine may also be used.   For some severe infections, a treatment period longer than 14 days may be needed.   Oral antifungal medicines are almost never used during pregnancy because the fetus may be harmed. However, if a pregnant woman has a rare, severe thrush infection that has spread to her blood, oral antifungal medicines may be used. In this case, the risk of harm to the mother and fetus from the severe thrush infection may be greater than the risk posed by the use of antifungal medicines.  Persistent or Recurrent Thrush For cases of thrush that do not go away or keep coming back, treatment may involve the following:   Treatment may be needed twice as long as the symptoms last.   Treatment will include both oral and topical antifungal medicines.   People with weakened immune systems can take an antifungal medicine on a continuous basis to prevent thrush infections.  It is important to treat conditions that make you more likely to get thrush, such as diabetes or HIV.  HOME CARE INSTRUCTIONS   Only take over-the-counter or prescription medicine as directed by your health care provider. Talk to your health care provider about an over-the-counter medicine called gentian violet, which kills bacteria and fungi.   Eat plain, unflavored yogurt as directed by your health care provider. Check the label to make sure the yogurt contains live cultures. This yogurt can help healthy bacteria grow in the mouth that can stop the growth of the fungus that causes thrush.   Try these measures to help  reduce the discomfort of thrush:   Drink cold liquids such as water or iced tea.   Try flavored ice treats or frozen juices.   Eat foods that are easy to swallow, such as gelatin, ice cream, or custard.   If the patches in your mouth are painful, try drinking from a straw.   Rinse your mouth several times a day with a warm saltwater rinse. You can make the saltwater mixture with 1 tsp (6  g) of salt in 8 fl oz (0.2 L) of warm water.   If you wear dentures, remove the dentures before going to bed, brush them vigorously, and soak them in a cleaning solution as directed by your health care provider.   Women who are breastfeeding should clean their nipples with an antifungal medicine as directed by their health care provider. Dry the nipples after breastfeeding. Applying lanolin-containing body lotion may help relieve nipple soreness.  SEEK MEDICAL CARE IF:  Your symptoms are getting worse or are not improving within 7 days of starting treatment.   You have symptoms of spreading infection, such as white patches on the skin outside of the mouth.   You are nursing and you have redness, burning, or pain in the nipples that is not relieved with treatment.  MAKE SURE YOU:  Understand these instructions.  Will watch your condition.  Will get help right away if you are not doing well or get worse.   This information is not intended to replace advice given to you by your health care provider. Make sure you discuss any questions you have with your health care provider.   Document Released: 06/09/2004 Document Revised: 10/05/2014 Document Reviewed: 04/17/2013 Elsevier Interactive Patient Education 2016 [employer].   Emergency Department Resource Guide 1) Find a Doctor and Pay Out of Pocket Although you won't have to find out who is covered by your insurance plan, it is a good idea to ask around and get recommendations. You will then need to call the office and see if the doctor you have chosen will accept you as a new patient and what types of options they offer for patients who are self-pay. Some doctors offer discounts or will set up payment plans for their patients who do not have insurance, but you will need to ask so you aren't surprised when you get to your appointment.  2) Contact Your Local Health Department Not all health departments have doctors that can see  patients for sick visits, but many do, so it is worth a call to see if yours does. If you don't know where your local health department is, you can check in your phone book. The CDC also has a tool to help you locate your state's health department, and many state websites also have listings of all of their local health departments.  3) Find a Walk-in Clinic If your illness is not likely to be very severe or complicated, you may want to try a walk in clinic. These are popping up all over the country in pharmacies, drugstores, and shopping centers. They're usually staffed by nurse practitioners or physician assistants that have been trained to treat common illnesses and complaints. They're usually fairly quick and inexpensive. However, if you have serious medical issues or chronic medical problems, these are probably not your best option.  No Primary Care Doctor: - Call [facility] at  [phone] - they can help you locate a primary care doctor that  accepts your insurance, provides certain services, etc. -  Physician Referral Service- [phone]  Chronic Pain Problems: Organization         Address  Phone   Notes  [facility]  [phone] Patients need to be referred by their primary care doctor.   Medication Assistance: Organization         Address  Phone   Notes  [facility] Medication [facility] [address] [place], [place] [address] [phone] --Must be a resident of [facility] -- Must have NO insurance coverage whatsoever (no Medicaid/ Medicare, etc.) -- The pt. MUST have a primary care doctor that directs their care regularly and follows them in the community   [facility]  [phone]   [employer]  [phone]    Agencies that provide inexpensive medical care: Organization         Address  Phone   Notes  [facility]  [phone]   [facility]    [phone]   [facility] [address] [place], [place] [address] [phone]   [facility] [address], [place] [phone]   [facility]    [phone]   [facility]    [phone]   [facility]  [address], [place] Phone:  [phone], Fax:  [phone] Hours of Operation:  9 am - 6 pm, M-F.  Also accepts Medicaid/Medicare and self-pay.  [facility]  [address], [place] Phone: [phone], Fax: [phone]. Hours of Operation:  8:30 am - 5:30 pm, M-F.  Also accepts Medicaid and self-pay.  [facility] [place] [address] [place] Phone: [phone]   [facility] [address] Natasha Bence [place], [place] [phone], Ext. [phone] Mondays & Thursdays: 7-9 AM.  First 15 patients are seen on a first come, first serve basis.    Medicaid-accepting [facility] Providers:  Organization         Address  Phone   Notes  [facility] [address], [place] [phone] Also accepts self-pay patients.  [facility] [address] Laurell Josephs [place], [place]  [phone]   [facility] [address], [place] [phone]   [facility] [address] [place] [phone]   Renaye Rakers [address], [place]   [phone] Only accepts [place] patients after they have their name applied to their card.   Self-Pay (no insurance) in [facility]:  Organization         Address  Phone   Notes  Sickle Cell Patients, [facility] [address] [place], [place] [phone]   [facility] [address] [place], [place] [phone]   [facility] [place]  [address], [place] [phone]   [facility]/Dr. Osei-Bonsu   [address], [place] or [address], [place] [phone] Phone number for both [place] and [place] locations is the same.  [facility] [address], [place] [phone]   [facility] [address], [place] or [address] [phone]   [facility] [address] [place], [place] [phone],  phone; [phone], fax Sees patients 1st and 3rd Saturday of every month.  Must not qualify for public or private insurance (i.e. Medicaid, Medicare, [place] [employer] Choice, Veterans' Benefits)  Household income should be no more than 200% of the poverty level The clinic cannot treat you if you are pregnant or think you are pregnant  Sexually transmitted diseases are not treated at the clinic.    Dental Care: Organization         Address  Phone  Notes  [facility] Department of [facility] [address] [place], [place] [phone] Accepts children up to age 53 who are enrolled in [place] or [place] Health Choice; pregnant women with a Medicaid card; and children who have applied for Medicaid or [place] Health Choice, but were declined, whose parents can pay a reduced fee at time of service.  [facility] Department of [facility]  [address], [place] [phone] Accepts children up to age 32 who are enrolled in [place] or [place] Health Choice; pregnant women with a Medicaid card; and children who have applied for Medicaid or  Health Choice, but were declined, whose parents can pay a reduced fee at time of service.  [facility]  [address] [place], [place] [phone] Patients are seen by appointment only. Walk-ins are not accepted. [facility] will see patients 75 years of age and older. Monday - Tuesday (8am-5pm) Most Wednesdays (8:30-5pm) $30 per visit, cash only  [facility]  PROGRAM  [address], [facility] [phone] Patients are seen by appointment only. Walk-ins are not accepted. [facility] will see patients 47 years of age and older. One Wednesday Evening (Monthly: Volunteer Based).  $30 per visit, cash only  [employer]  [phone] for adults; Children under age 15, call Graduate Pediatric Dentistry at [phone]. Children aged 44-14, please call [phone] to request a pediatric application.  Dental services are provided in all areas of dental care including fillings, crowns and bridges, complete and partial dentures, implants, gum treatment, root canals, and extractions. Preventive care is also provided. Treatment is provided to both adults and children. Patients are selected via a lottery and there is often a waiting list.   [facility] [address], [place]  [phone] [email or web address]   [facility] [address] [place], [place] [phone], Ext. [phone] Second and Fourth Thursday of each month, opens at 6:30 AM; Clinic ends at 9 AM.  Patients are seen on a first-come first-served basis, and a limited number are seen during each clinic.   [facility]  [address] [place], [place] [phone]   Eligibility Requirements You must have lived in [place], [place], or [place] counties for at least the last three months.   You cannot be eligible for state or federal sponsored [place], including [employer], [place], or [employer].   You generally cannot be eligible for healthcare insurance through your employer.    How to apply: Eligibility screenings are held every Tuesday and Wednesday afternoon from 1:00 pm until 4:00 pm. You do not need an appointment for the interview!  [facility] [address] [place], [place] [phone]   [facility] Health Department  [phone]   [facility] Health Department   [phone]   [facility] Health Department  [phone]    Behavioral Health Resources in  the Community: Intensive Outpatient Programs Organization         Address  Phone  Notes  [facility] [address], [place], [place] [phone]   [facility] [address], [place], [place] [phone]   [facility] [address], [place], [place]  [phone]   [facility] [address],  [place], [place] [phone] or [phone]   Substance Abuse Resources Organization         Address  Phone  Notes  [facility]  [phone]   [facility]  [phone]   [facility]  [phone]   [facility]  [phone]   Residential & Outpatient Substance Abuse Program  [phone]   Psychological Services Organization         Address  Phone  Notes  [facility]  [phone]- [phone]   [facility]  [phone]   [facility] [address], [place] [phone] or [phone]    Mobile Crisis Teams Organization         Address  Phone  Notes  [facility], Mobile Crisis Care Unit  [phone]   [facility]  [address] [place], [place] [phone]   Doristine LocksSharon DeEsch [address] [place] [phone]    Self-Help/Support Groups Organization         Address  Phone             Notes  [facility] [place] - variety of support groups  [phone] Call for more information  Narcotics Anonymous (NA), [facility] [address], [place]  2 meetings at this location   Statisticianesidential Treatment Programs Organization         Address  Phone  Notes  [facility] [address],    [place]  [phone]   [facility]  [address], [place] [phone], [place], [place] [phone]   [facility] [address], [place] [phone] Admissions: 8am-3pm M-F  [facility] [address]    [place], [place] [phone]   [facility] [address], [place], [place] [phone]   [facility] [address]  [place], [place] [phone]   [facility] - Intensive Outpatient [address], [place], [place] [phone]   [facility] ([facility] [address]  [place], [place] [phone] or [phone]   [facility] ([facility]) [address] [place], [place] [phone] Accepts Medicaid  [facility] [address]  [place] [phone] Substance Abuse/Addiction Treatment   [facility] Resources Organization         Address  Phone  Notes  [facility]  [phone]   Angie FavaJulie Brannon, PhD [address], [address] [place], [place]   [phone] or [phone]   [facility]   [address] [place], [place] [phone]   [facility] [address], [place], [place] [phone] Insurance/Medicaid/sponsorship through [facility]  [facility] [address]                                    [place], [place] [phone] Therapy/tele-psych/case  [facility] [address]   [place], [place] [phone]) [phone]  Dr. Adele Schilder  [phone]   [facility] Dept. 1) [address], [place] 2) [place] 3)  [place] [address], [place] [phone]  [phone]   [place] [phone] or [phone] (After Hours)

## 2015-08-18 NOTE — ED Provider Notes (Signed)
.CSN: [id number]     Arrival date & time 08/18/15  0801 History   First MD Initiated Contact with Patient 08/18/15 [phone]     Chief Complaint  Patient presents with  . Fever    HPI   Javier Hill is a 34 y.o. male with a PMH of HTN, HIV, epilepsy, migraines who presents to the ED with fever, headache, sore throat, and bilateral low back pain. He reports fever to 102 x 2 days, and states his headache started last night and has been constant since that time. He reports sore throat x 2 weeks and bilateral low back pain x 1 month, but states his symptoms have progressively worsened, prompting him to come to the ED. He reports movement exacerbates his back pain. He has not tried anything for symptom relief. He denies lightheadedness, dizziness, vision changes, chest pain, shortness of breath, abdominal pain. He reports nausea and and vomiting, which he states occurs after coughing. He reports he follows with Dr. Ninetta LightsHatcher (ID) and was last seen 2 months ago and had a normal CD4 at that time. He states he is compliant with his HIV therapy.  He also complains of pain to his left index finger after injuring his finger while doing work around the house. He denies numbness, weakness, paresthesia.   Past Medical History  Diagnosis Date  . Hypertension   . Anxiety epi  . HIV (human immunodeficiency virus infection) (HCC)   . Seizure (HCC)     last seizure 09/05/2014  . Epilepsy (HCC)   . Migraines 2016  . Panic attack    Past Surgical History  Procedure Laterality Date  . Orif clavicular fracture     Family History  Problem Relation Age of Onset  . Adopted: Yes   Social History  Substance Use Topics  . Smoking status: Current Every Day Smoker -- 0.10 packs/day    Types: Cigarettes  . Smokeless tobacco: Never Used     Comment: cutting back  . Alcohol Use: No      Review of Systems  Constitutional: Positive for fever. Negative for chills.  HENT: Positive for congestion and sore  throat.   Eyes: Negative for visual disturbance.  Respiratory: Positive for cough. Negative for shortness of breath.   Cardiovascular: Negative for chest pain.  Gastrointestinal: Positive for nausea and vomiting. Negative for abdominal pain, diarrhea and constipation.  Genitourinary: Negative for dysuria, urgency and frequency.  Musculoskeletal: Positive for back pain and arthralgias.  Neurological: Positive for headaches. Negative for dizziness, syncope, weakness, light-headedness and numbness.  All other systems reviewed and are negative.     Allergies  Caffeine; Ciprofloxacin; Penicillins; Sulfamethoxazole-trimethoprim; Tramadol; Acyclovir and related; Haldol; Librium; Aloprim; Ambien; Bactrim; Other; Pneumovax; Prevnar; Watermelon flavor; and Zoloft  Home Medications   Prior to Admission medications   Medication Sig Start Date End Date Taking? Authorizing Provider  anti-nausea (EMETROL) solution Take 10 mLs by mouth every 15 (fifteen) minutes as needed for nausea or vomiting.   Yes Historical Provider, MD  clonazePAM (KLONOPIN) 1 MG tablet Take 1 tablet (1 mg total) by mouth 2 (two) times daily as needed for anxiety. 01/05/15  Yes Rolan BuccoMelanie Belfi, MD  emtricitabine-tenofovir (TRUVADA) 200-300 MG per tablet Take 1 tablet by mouth daily. 05/03/15  Yes Ginnie SmartJeffrey C Hatcher, MD  HYDROcodone-acetaminophen (NORCO/VICODIN) 5-325 MG tablet Take 1-2 tablets by mouth every 6 (six) hours as needed for moderate pain or severe pain.   Yes Historical Provider, MD  levETIRAcetam (KEPPRA) 500 MG tablet Take 1  tablet (500 mg total) by mouth 2 (two) times daily. 06/26/15  Yes Henrietta Hoover, NP  lisinopril (PRINIVIL,ZESTRIL) 20 MG tablet Take 1 tablet (20 mg total) by mouth daily. 03/29/15  Yes Henrietta Hoover, NP  omeprazole (PRILOSEC OTC) 20 MG tablet Take 1 tablet (20 mg total) by mouth daily. 03/29/15  Yes Henrietta Hoover, NP  phenytoin (DILANTIN) 100 MG ER capsule Take 1 capsule (100 mg total) by mouth 3  (three) times daily. 03/29/15  Yes Henrietta Hoover, NP  raltegravir (ISENTRESS) 400 MG tablet Take 1 tablet (400 mg total) by mouth 2 (two) times daily. 05/03/15  Yes Ginnie Smart, MD  acetaminophen (TYLENOL) 500 MG tablet Take 1 tablet (500 mg total) by mouth every 6 (six) hours as needed. 08/18/15   Mady Gemma, PA-C  diclofenac sodium (VOLTAREN) 1 % GEL Apply 1 application topically 4 (four) times daily. Patient not taking: Reported on 08/18/2015 07/23/15   Hayden Rasmussen, NP  methocarbamol (ROBAXIN) 500 MG tablet Take 1 tablet (500 mg total) by mouth 2 (two) times daily. 08/18/15   Mady Gemma, PA-C  nystatin (MYCOSTATIN) 100000 UNIT/ML suspension Take 5 mLs (500,000 Units total) by mouth 4 (four) times daily. 08/18/15   Mady Gemma, PA-C    BP 117/75 mmHg  Pulse 80  Temp(Src) 98.6 F (37 C) (Oral)  Resp 18  Ht  (1.626 m)  Wt 140 lb (63.504 kg)  BMI 24.02 kg/m2  SpO2 98% Physical Exam  Constitutional: He is oriented to person, place, and time. He appears well-developed and well-nourished. No distress.  HENT:  Head: Normocephalic and atraumatic.  Right Ear: External ear normal.  Left Ear: External ear normal.  Nose: Nose normal.  Mouth/Throat: Uvula is midline and mucous membranes are normal. Oropharyngeal exudate and posterior oropharyngeal erythema present. No posterior oropharyngeal edema or tonsillar abscesses.  Bilateral tonsillar hypertrophy with white exudate to tongue and tonsils.  Eyes: Conjunctivae, EOM and lids are normal. Pupils are equal, round, and reactive to light. Right eye exhibits no discharge. Left eye exhibits no discharge. No scleral icterus.  Neck: Normal range of motion. Neck supple.  Cardiovascular: Normal rate, regular rhythm, normal heart sounds, intact distal pulses and normal pulses.   Pulmonary/Chest: Effort normal and breath sounds normal. No respiratory distress. He has no wheezes. He has no rales.  Abdominal: Soft.  Normal appearance and bowel sounds are normal. He exhibits no distension and no mass. There is no tenderness. There is no rigidity, no rebound and no guarding.  Musculoskeletal: Normal range of motion. He exhibits tenderness. He exhibits no edema.  TTP of bilateral lumbar paraspinal muscles. No midline tenderness, step off, or deformity. Mild TTP of left DIP with decreased range of motion due to pain. No significant edema or erythema. Cap refill <3 seconds.  Neurological: He is alert and oriented to person, place, and time. He has normal strength. No cranial nerve deficit or sensory deficit.  Skin: Skin is warm, dry and intact. No rash noted. He is not diaphoretic. No erythema. No pallor.  Psychiatric: He has a normal mood and affect. His speech is normal and behavior is normal.  Nursing note and vitals reviewed.   ED Course  Procedures (including critical care time)  Labs Review Labs Reviewed  CBC WITH DIFFERENTIAL/PLATELET - Abnormal; Notable for the following:    WBC 13.9 (*)    Neutro Abs 11.4 (*)    Monocytes Absolute 1.1 (*)    All other  components within normal limits  BASIC METABOLIC PANEL - Abnormal; Notable for the following:    Sodium 134 (*)    Glucose, Bld 104 (*)    All other components within normal limits  RAPID STREP SCREEN (NOT AT [facility])  CULTURE, GROUP A STREP    Imaging Review Dg Chest 2 View  08/18/2015  CLINICAL DATA:  Fever and sore throat.  Vomiting. EXAM: CHEST  2 VIEW COMPARISON:  06/14/2015 FINDINGS: There is slight peribronchial thickening as well slight atelectasis at the left lung base anteriorly. No consolidative infiltrates or effusions. Heart size and vascularity are normal. No acute osseous abnormality. IMPRESSION: Bronchitic changes with slight atelectasis in the lingula. Electronically Signed   By: Francene Boyers M.D.   On: 08/18/2015 09:53   Dg Hand Complete Left  08/18/2015  CLINICAL DATA:  Pain of the index finger for 2 days. No known trauma.  EXAM: LEFT HAND - COMPLETE 3+ VIEW COMPARISON:  None. FINDINGS: There is no fracture or dislocation. Slight degenerative changes of the DIP joint of the index finger. There is a flexion deformity at the DIP joint. Otherwise, normal exam. 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 Electronically Signed   By: Francene Boyers M.D.   On: 08/18/2015 12:43     I have personally reviewed and evaluated these images and lab results as part of my medical decision-making.   EKG Interpretation None      MDM   Final diagnoses:  Headache, unspecified headache type  Oral candida  Back pain, unspecified location  Finger injury, left, initial encounter    34 year old male presents with fever, headache, sore throat, and bilateral low back pain. Denies lightheadedness, dizziness, vision changes, chest pain, shortness of breath, abdominal pain. Reports nausea and and vomiting, which occurs after coughing. Reports he follows with Dr. Ninetta Lights (ID) and was last seen 2 months ago and had a normal CD4 at that time. He states he is compliant with his HIV therapy.  Patient is afebrile. Vital signs stable. White exudate to tongue and to posterior oropharynx with bilateral tonsillar hypertrophy. No tonsillar abscess. Heart RRR. Lungs clear to auscultation bilaterally. Abdomen soft, nontender, nondistended. TTP of bilateral lumbar paraspinal muscles. No midline tenderness, step off, or deformity. Mild TTP of left DIP with decreased range of motion due to pain. No significant edema or erythema. Cap refill <3 seconds. Patient ambulates without difficulty. Normal neuro exam with no focal deficit. Strength, sensation, DTRs intact.   CBC remarkable for leukocytosis of 13.9. BMP unremarkable. Rapid strep negative. CXR with bronchitic changes. Given migraine cocktail.  Patient now febrile to 100.8, will order additional fluid bolus and reassess. Patient reports symptom improvement  s/p medication administration.  Imaging of left hand remarkable for flexion deformity and slight arthritis of DIP joint. Finger splinted.   Patient's fever improved to 98.6, he is nontoxic and well-appearing. Feel he is stable for discharge at this time. Exudate to posterior oropharynx appears consistent with candida, will treat with nystatin. Advised patient he can try OTC cough medication for additional symptom relief. Back pain most likely muscular, will give tylenol and robaxin for pain. Patient to follow up with PCP this week and with orthopedics for further evaluation of left finger injury. Return precautions discussed. Patient verbalizes his understanding and is in agreement with plan.  Patient discussed with and seen by Dr. Adriana Simas.  BP 117/75 mmHg  Pulse 80  Temp(Src) 98.6 F (37 C) (Oral)  Resp 18  Ht  (1.626 m)  Wt 140 lb (63.504 kg)  BMI 24.02 kg/m2  SpO2 98%      Mady Gemma, PA-C 08/18/15 1644  Donnetta Hutching, MD 08/20/15 1228

## 2015-08-18 NOTE — ED Notes (Signed)
fingersplint applied. Pt given rx and d/c instructions, verbalized understanding.

## 2015-08-18 NOTE — ED Notes (Signed)
Patient states he has had a fever (Tmax 102 oral) x2 days.  Patient also c/o bilateral low back pain x1 month, worsening over last 2 days.  Patient denies urinary s/s.  Patient also c/o sore throat since last night. Mild erythema noted to oropharynx.  Some LAD noted right submandiular lymph node.  Patient states he began to experience N/V 2 days ago with 5 episodes of vomiting total since that time.  Patient denies diarrhea.  [facility] month for fever and myalgias.  Dx with viral illness.

## 2015-08-20 LAB — CULTURE, GROUP A STREP

## 2015-09-04 ENCOUNTER — Other Ambulatory Visit: Payer: Self-pay | Admitting: Family Medicine

## 2015-09-09 ENCOUNTER — Other Ambulatory Visit: Payer: Self-pay | Admitting: Infectious Diseases

## 2015-09-09 MED ORDER — PHENYTOIN SODIUM EXTENDED 100 MG PO CAPS: 100.0000 mg | ORAL_CAPSULE | Freq: Three times a day (TID) | ORAL | Status: DC

## 2015-09-09 MED ORDER — LEVETIRACETAM 500 MG PO TABS: 500.0000 mg | ORAL_TABLET | Freq: Two times a day (BID) | ORAL | Status: DC

## 2015-09-09 MED ORDER — OMEPRAZOLE MAGNESIUM 20 MG PO TBEC: 20.0000 mg | DELAYED_RELEASE_TABLET | Freq: Every day | ORAL | Status: DC

## 2015-09-09 NOTE — Telephone Encounter (Signed)
Refills sent into pharmacy. Thanks!  

## 2015-10-23 ENCOUNTER — Ambulatory Visit: Payer: Self-pay | Admitting: Infectious Diseases

## 2015-10-24 ENCOUNTER — Ambulatory Visit: Payer: Self-pay | Admitting: Infectious Diseases

## 2015-11-18 ENCOUNTER — Other Ambulatory Visit: Payer: Self-pay | Admitting: Family Medicine

## 2015-11-18 ENCOUNTER — Other Ambulatory Visit: Payer: Self-pay

## 2015-11-18 MED ORDER — PHENYTOIN SODIUM EXTENDED 100 MG PO CAPS: 100.0000 mg | ORAL_CAPSULE | Freq: Three times a day (TID) | ORAL | Status: DC

## 2015-11-18 NOTE — Telephone Encounter (Signed)
1 month refill sent in for dilantin. Patient needs an appointment for future refills. Thanks!

## 2015-12-03 ENCOUNTER — Ambulatory Visit: Payer: Self-pay

## 2015-12-15 ENCOUNTER — Other Ambulatory Visit: Payer: Self-pay | Admitting: Infectious Diseases

## 2015-12-23 ENCOUNTER — Other Ambulatory Visit: Payer: Self-pay | Admitting: *Deleted

## 2015-12-23 DIAGNOSIS — B20 Human immunodeficiency virus [HIV] disease: Secondary | ICD-10-CM

## 2015-12-23 MED ORDER — RALTEGRAVIR POTASSIUM 400 MG PO TABS: 400.0000 mg | ORAL_TABLET | Freq: Two times a day (BID) | ORAL | Status: DC

## 2015-12-23 MED ORDER — EMTRICITABINE-TENOFOVIR DF 200-300 MG PO TABS: 1.0000 | ORAL_TABLET | Freq: Every day | ORAL | Status: DC

## 2015-12-24 ENCOUNTER — Other Ambulatory Visit: Payer: Self-pay

## 2015-12-24 ENCOUNTER — Other Ambulatory Visit: Payer: Self-pay | Admitting: Infectious Diseases

## 2015-12-24 MED ORDER — LEVETIRACETAM 500 MG PO TABS
500.0000 mg | ORAL_TABLET | Freq: Two times a day (BID) | ORAL | Status: DC
Start: 2015-12-24 — End: 2016-02-14

## 2015-12-24 NOTE — Telephone Encounter (Signed)
Refilled levetiracetam for 1 month and advised pharmacy to have patient call and make appointment for any additional refills. Thanks!

## 2015-12-25 ENCOUNTER — Ambulatory Visit: Payer: Self-pay

## 2016-01-22 ENCOUNTER — Other Ambulatory Visit: Payer: Self-pay | Admitting: Family Medicine

## 2016-01-27 ENCOUNTER — Emergency Department (HOSPITAL_BASED_OUTPATIENT_CLINIC_OR_DEPARTMENT_OTHER)
Admission: EM | Admit: 2016-01-27 | Discharge: 2016-01-27 | Disposition: A | Discharge status: Home / Self Care | Payer: Self-pay | Attending: Emergency Medicine | Admitting: Emergency Medicine

## 2016-01-27 ENCOUNTER — Encounter (HOSPITAL_BASED_OUTPATIENT_CLINIC_OR_DEPARTMENT_OTHER): Payer: Self-pay | Admitting: *Deleted

## 2016-01-27 ENCOUNTER — Emergency Department (HOSPITAL_BASED_OUTPATIENT_CLINIC_OR_DEPARTMENT_OTHER): Payer: Self-pay

## 2016-01-27 DIAGNOSIS — R109 Unspecified abdominal pain: Secondary | ICD-10-CM

## 2016-01-27 DIAGNOSIS — Z79899 Other long term (current) drug therapy: Secondary | ICD-10-CM | POA: Insufficient documentation

## 2016-01-27 DIAGNOSIS — I10 Essential (primary) hypertension: Secondary | ICD-10-CM | POA: Insufficient documentation

## 2016-01-27 DIAGNOSIS — B20 Human immunodeficiency virus [HIV] disease: Secondary | ICD-10-CM | POA: Insufficient documentation

## 2016-01-27 DIAGNOSIS — F17210 Nicotine dependence, cigarettes, uncomplicated: Secondary | ICD-10-CM | POA: Insufficient documentation

## 2016-01-27 DIAGNOSIS — G40909 Epilepsy, unspecified, not intractable, without status epilepticus: Secondary | ICD-10-CM | POA: Insufficient documentation

## 2016-01-27 DIAGNOSIS — R1084 Generalized abdominal pain: Secondary | ICD-10-CM | POA: Insufficient documentation

## 2016-01-27 DIAGNOSIS — K625 Hemorrhage of anus and rectum: Secondary | ICD-10-CM | POA: Insufficient documentation

## 2016-01-27 LAB — CBC WITH DIFFERENTIAL/PLATELET
Basophils Absolute: 0 10*3/uL (ref 0.0–0.1)
Basophils Relative: 0 %
EOS ABS: 0.1 10*3/uL (ref 0.0–0.7)
Eosinophils Relative: 1 %
HCT: 43.5 % (ref 39.0–52.0)
Hemoglobin: 15.3 g/dL (ref 13.0–17.0)
Lymphocytes Relative: 32 %
Lymphs Abs: 2.2 10*3/uL (ref 0.7–4.0)
MCH: 32.6 pg (ref 26.0–34.0)
MCHC: 35.2 g/dL (ref 30.0–36.0)
MCV: 92.8 fL (ref 78.0–100.0)
MONO ABS: 0.5 10*3/uL (ref 0.1–1.0)
MONOS PCT: 7 %
NEUTROS PCT: 60 %
Neutro Abs: 4.2 10*3/uL (ref 1.7–7.7)
Platelets: 233 10*3/uL (ref 150–400)
RBC: 4.69 MIL/uL (ref 4.22–5.81)
RDW: 13 % (ref 11.5–15.5)
WBC: 7 10*3/uL (ref 4.0–10.5)

## 2016-01-27 LAB — COMPREHENSIVE METABOLIC PANEL
ALBUMIN: 4.4 g/dL (ref 3.5–5.0)
ALT: 26 U/L (ref 17–63)
ANION GAP: 5 (ref 5–15)
AST: 18 U/L (ref 15–41)
Alkaline Phosphatase: 102 U/L (ref 38–126)
BUN: 10 mg/dL (ref 6–20)
CALCIUM: 9 mg/dL (ref 8.9–10.3)
CO2: 28 mmol/L (ref 22–32)
CREATININE: 0.65 mg/dL (ref 0.61–1.24)
Chloride: 104 mmol/L (ref 101–111)
GFR calc non Af Amer: >60 mL/min (ref >60)
GLUCOSE: 102 mg/dL — AB (ref 65–99)
Potassium: 4.3 mmol/L (ref 3.5–5.1)
SODIUM: 137 mmol/L (ref 135–145)
TOTAL PROTEIN: 7.5 g/dL (ref 6.5–8.1)
Total Bilirubin: 0.5 mg/dL (ref 0.3–1.2)

## 2016-01-27 LAB — PHENYTOIN LEVEL, TOTAL: Phenytoin Lvl: 3.1 ug/mL — ABNORMAL LOW (ref 10.0–20.0)

## 2016-01-27 MED ORDER — FENTANYL CITRATE (PF) 100 MCG/2ML IJ SOLN
100.0000 ug | Freq: Once | INTRAMUSCULAR | Status: AC
  Administered 2016-01-27: 100 ug via INTRAVENOUS
  Filled 2016-01-27: qty 2

## 2016-01-27 MED ORDER — ONDANSETRON HCL 4 MG/2ML IJ SOLN
4.0000 mg | Freq: Once | INTRAMUSCULAR | Status: AC
  Administered 2016-01-27: 4 mg via INTRAVENOUS
  Filled 2016-01-27: qty 2

## 2016-01-27 MED ORDER — LORAZEPAM 2 MG/ML IJ SOLN
1.0000 mg | Freq: Once | INTRAMUSCULAR | Status: AC
  Administered 2016-01-27: 1 mg via INTRAVENOUS
  Filled 2016-01-27: qty 1

## 2016-01-27 MED ORDER — PHENYTOIN SODIUM 50 MG/ML IJ SOLN
INTRAMUSCULAR | Status: AC
  Filled 2016-01-27: qty 5

## 2016-01-27 MED ORDER — SODIUM CHLORIDE 0.9 % IV BOLUS (SEPSIS)
1000.0000 mL | Freq: Once | INTRAVENOUS | Status: AC
  Administered 2016-01-27: 1000 mL via INTRAVENOUS

## 2016-01-27 MED ORDER — SODIUM CHLORIDE 0.9 % IV SOLN
500.0000 mg | Freq: Once | INTRAVENOUS | Status: AC
  Administered 2016-01-27: 500 mg via INTRAVENOUS

## 2016-01-27 MED ORDER — IOPAMIDOL (ISOVUE-300) INJECTION 61%
100.0000 mL | Freq: Once | INTRAVENOUS | Status: AC | PRN
Start: 2016-01-27 — End: 2016-01-27
  Administered 2016-01-27: 100 mL via INTRAVENOUS

## 2016-01-27 NOTE — ED Provider Notes (Signed)
CSN: [id number]     Arrival date & time 01/27/16  0539 History   First MD Initiated Contact with Patient 01/27/16 [phone]     Chief Complaint  Patient presents with  . Rectal Bleeding     (Consider location/radiation/quality/duration/timing/severity/associated sxs/prior Treatment) HPI  This is a 35 year old male with HIV infection well-controlled with antiretrovirals. He is here with generalized abdominal pain and bloody diarrhea which began yesterday evening. He states the bleeding was severe enough to turn the toilet bowl red. He describes the pain in his abdomen is constant and rates it as about a 7 out of 10. He is also nauseated and has been vomiting on and off for the past week. Has not had a fever but has had chills. He also has a history of chronic anxiety which has been bothering him the past week. He also has some chronic pain in his left upper back and is having some sharp, well localized pleuritic chest pain in his left upper chest.  Past Medical History  Diagnosis Date  . Hypertension   . Anxiety epi  . HIV (human immunodeficiency virus infection) (HCC)   . Seizure (HCC)     last seizure 09/05/2014  . Epilepsy (HCC)   . Migraines 2016  . Panic attack    Past Surgical History  Procedure Laterality Date  . Orif clavicular fracture     Family History  Problem Relation Age of Onset  . Adopted: Yes   Social History  Substance Use Topics  . Smoking status: Current Every Day Smoker -- 0.10 packs/day    Types: Cigarettes  . Smokeless tobacco: Never Used     Comment: cutting back  . Alcohol Use: 0.0 oz/week    0 Standard drinks or equivalent per week     Comment: occasional     Review of Systems  All other systems reviewed and are negative.   Allergies  Caffeine; Ciprofloxacin; Penicillins; Sulfamethoxazole-trimethoprim; Tramadol; Acyclovir and related; Haldol; Librium; Aloprim; Ambien; Bactrim; Other; Pneumovax; Prevnar; Watermelon flavor; and Zoloft  Home  Medications   Prior to Admission medications   Medication Sig Start Date End Date Taking? Authorizing Provider  acetaminophen (TYLENOL) 500 MG tablet Take 1 tablet (500 mg total) by mouth every 6 (six) hours as needed. 08/18/15   Mady GemmaElizabeth C Westfall, PA-C  anti-nausea (EMETROL) solution Take 10 mLs by mouth every 15 (fifteen) minutes as needed for nausea or vomiting.    Historical Provider, MD  clonazePAM (KLONOPIN) 1 MG tablet Take 1 tablet (1 mg total) by mouth 2 (two) times daily as needed for anxiety. 01/05/15   Rolan BuccoMelanie Belfi, MD  diclofenac sodium (VOLTAREN) 1 % GEL Apply 1 application topically 4 (four) times daily. Patient not taking: Reported on 08/18/2015 07/23/15   Hayden Rasmussenavid Mabe, NP  emtricitabine-tenofovir (TRUVADA) 200-300 MG tablet Take 1 tablet by mouth daily. 12/23/15   Ginnie SmartJeffrey C Hatcher, MD  HYDROcodone-acetaminophen (NORCO/VICODIN) 5-325 MG tablet Take 1-2 tablets by mouth every 6 (six) hours as needed for moderate pain or severe pain.    Historical Provider, MD  levETIRAcetam (KEPPRA) 500 MG tablet Take 1 tablet (500 mg total) by mouth 2 (two) times daily. 12/24/15   Henrietta HooverLinda C Bernhardt, NP  lisinopril (PRINIVIL,ZESTRIL) 20 MG tablet Take 1 tablet (20 mg total) by mouth daily. 03/29/15   Henrietta HooverLinda C Bernhardt, NP  lisinopril (PRINIVIL,ZESTRIL) 20 MG tablet TAKE 1 TABLET BY MOUTH DAILY 01/22/16   Henrietta HooverLinda C Bernhardt, NP  methocarbamol (ROBAXIN) 500 MG tablet Take 1 tablet (500  mg total) by mouth 2 (two) times daily. 08/18/15   Mady Gemma, PA-C  nystatin (MYCOSTATIN) 100000 UNIT/ML suspension Take 5 mLs (500,000 Units total) by mouth 4 (four) times daily. 08/18/15   Mady Gemma, PA-C  omeprazole (PRILOSEC OTC) 20 MG tablet Take 1 tablet (20 mg total) by mouth daily. 09/09/15   Henrietta Hoover, NP  phenytoin (DILANTIN) 100 MG ER capsule Take 1 capsule (100 mg total) by mouth 3 (three) times daily. 11/18/15   Henrietta Hoover, NP  raltegravir (ISENTRESS) 400 MG tablet Take 1  tablet (400 mg total) by mouth 2 (two) times daily. 12/23/15   Ginnie Smart, MD  TRUVADA 200-300 MG tablet TAKE 1 TABLET BY MOUTH EVERY DAY 12/24/15   Ginnie Smart, MD   BP 122/82 mmHg  Pulse 62  Temp(Src) 98.1 F (36.7 C) (Oral)  Resp 18  Ht  (1.626 m)  Wt 130 lb (58.968 kg)  BMI 22.30 kg/m2  SpO2 100%   Physical Exam  General: Well-developed, well-nourished male in no acute distress; appearance consistent with age of record HENT: normocephalic; atraumatic Eyes: pupils equal, round and reactive to light; extraocular muscles intact Neck: supple Heart: regular rate and rhythm Lungs: clear to auscultation bilaterally Abdomen: soft; nondistended; diffusely tender; no masses or hepatosplenomegaly; bowel sounds present Rectal: Normal sphincter tone; no formed stool in vault; blood on examining glove Extremities: No deformity; full range of motion; pulses normal Neurologic: Awake, alert and oriented; motor function intact in all extremities and symmetric; no facial droop Skin: Warm and dry Psychiatric: Normal mood and affect    ED Course  Procedures (including critical care time)   MDM  Nursing notes and vitals signs, including pulse oximetry, reviewed.  Summary of this visit's results, reviewed by myself:  Labs:  Results for orders placed or performed during the hospital encounter of 01/27/16 (from the past 24 hour(s))  CBC with Differential/Platelet     Status: None   Collection Time: 01/27/16  6:30 AM  Result Value Ref Range   WBC 7.0 4.0 - 10.5 K/uL   RBC 4.69 4.22 - 5.81 MIL/uL   Hemoglobin 15.3 13.0 - 17.0 g/dL   HCT 16.1 09.6 - 04.5 %   MCV 92.8 78.0 - 100.0 fL   MCH 32.6 26.0 - 34.0 pg   MCHC 35.2 30.0 - 36.0 g/dL   RDW 40.9 81.1 - 91.4 %   Platelets 233 150 - 400 K/uL   Neutrophils Relative % 60 %   Neutro Abs 4.2 1.7 - 7.7 K/uL   Lymphocytes Relative 32 %   Lymphs Abs 2.2 0.7 - 4.0 K/uL   Monocytes Relative 7 %   Monocytes Absolute 0.5 0.1 -  1.0 K/uL   Eosinophils Relative 1 %   Eosinophils Absolute 0.1 0.0 - 0.7 K/uL   Basophils Relative 0 %   Basophils Absolute 0.0 0.0 - 0.1 K/uL  Phenytoin level, total     Status: Abnormal   Collection Time: 01/27/16  6:30 AM  Result Value Ref Range   Phenytoin Lvl 3.1 (L) 10.0 - 20.0 ug/mL   7:05 AM Phenytoin 500 milligram IV load ordered for subtherapeutic level. Awaiting CT of the abdomen and pelvis. Dr. Karma Ganja will follow up on results and make disposition.      Paula Libra, MD 01/27/16 [phone]

## 2016-01-27 NOTE — ED Provider Notes (Signed)
7:40 AM pt signed out to me at change of shift pending abdominal CT scan.  CT scan is returned and is normal.  Pt has had some rectal bleeding, but hgb is normal.  Vitals are stable.  Will refer to GI.  Have discussed results with patient and he is agreeable with plan for discharge home.  He is receiving dilantin load now and then will discharge.    Jerelyn ScottMartha Linker, MD 01/27/16 [phone]

## 2016-01-27 NOTE — Discharge Instructions (Signed)
Return to the ED with any concerns including vomiting and not able to keep down liquids, increased bleeding, fainting, decreased level of alertness/lethargy, or any other alarming symptoms

## 2016-01-27 NOTE — ED Notes (Signed)
Transported to CT 

## 2016-01-27 NOTE — ED Notes (Signed)
Pt c/o right anterior chest pain that started last night that is worse with inspiration.  C/o general abd pain that is constant. States he has been taking mylanta without relief. Describes abd pain  as sharp. C/o bright red blood in stool which is states is "a lot"   C/o chills. Denies fevers. C/o vomiting on and off times one week. Pt is HIV positive.

## 2016-01-27 NOTE — ED Notes (Signed)
Drinking po contrast

## 2016-02-03 ENCOUNTER — Other Ambulatory Visit: Payer: Self-pay | Admitting: Family Medicine

## 2016-02-04 ENCOUNTER — Encounter: Payer: Self-pay | Admitting: Infectious Diseases

## 2016-02-12 ENCOUNTER — Other Ambulatory Visit: Payer: Self-pay

## 2016-02-12 ENCOUNTER — Telehealth: Payer: Self-pay | Admitting: *Deleted

## 2016-02-12 ENCOUNTER — Other Ambulatory Visit: Payer: Self-pay | Admitting: *Deleted

## 2016-02-12 ENCOUNTER — Other Ambulatory Visit: Payer: Self-pay | Admitting: Family Medicine

## 2016-02-12 DIAGNOSIS — G40901 Epilepsy, unspecified, not intractable, with status epilepticus: Secondary | ICD-10-CM

## 2016-02-12 DIAGNOSIS — Z113 Encounter for screening for infections with a predominantly sexual mode of transmission: Secondary | ICD-10-CM

## 2016-02-12 DIAGNOSIS — B20 Human immunodeficiency virus [HIV] disease: Secondary | ICD-10-CM

## 2016-02-12 LAB — CBC WITH DIFFERENTIAL/PLATELET
BASOS ABS: 0 {cells}/uL (ref 0–200)
BASOS PCT: 0 %
EOS ABS: 142 {cells}/uL (ref 15–500)
Eosinophils Relative: 2 %
HEMATOCRIT: 44 % (ref 38.5–50.0)
Hemoglobin: 14.7 g/dL (ref 13.2–17.1)
LYMPHS PCT: 31 %
Lymphs Abs: 2201 cells/uL (ref 850–3900)
MCH: 31.8 pg (ref 27.0–33.0)
MCHC: 33.4 g/dL (ref 32.0–36.0)
MCV: 95.2 fL (ref 80.0–100.0)
MONO ABS: 781 {cells}/uL (ref 200–950)
MONOS PCT: 11 %
MPV: 9.7 fL (ref 7.5–12.5)
Neutro Abs: 3976 cells/uL (ref 1500–7800)
Neutrophils Relative %: 56 %
PLATELETS: 233 10*3/uL (ref 140–400)
RBC: 4.62 MIL/uL (ref 4.20–5.80)
RDW: 14.4 % (ref 11.0–15.0)
WBC: 7.1 10*3/uL (ref 3.8–10.8)

## 2016-02-12 NOTE — Telephone Encounter (Signed)
Patient came to [facility] to pick up his Isentress/Truvada.  He asked if he could have a prescription for Keppra and Dilantin, too.  Per chart, this is prescribed by Armeniahina Hollis.  RN contacted her clinic.  She agreed to refill the meds, as long as patient had a dilantin level drawn and he schedules a follow up appointment. [facility] as well, dilantin level added on for draw today (ok per Dr. Ninetta LightsHatcher).  Patient will be contacted by pharmacy when his prescription is ready.  Patient given AVS with upcoming appointments and office contact information printed.  He verbalized understanding and agreement, states he will keep his appointments. He knows he needs to renew his [employer] - Kelby FamManuel is working with him on this. Andree CossHowell, Rachel Rison M, RN

## 2016-02-13 LAB — COMPLETE METABOLIC PANEL WITH GFR
ALT: 33 U/L (ref 9–46)
AST: 29 U/L (ref 10–40)
Albumin: 4.1 g/dL (ref 3.6–5.1)
Alkaline Phosphatase: 110 U/L (ref 40–115)
BILIRUBIN TOTAL: 0.2 mg/dL (ref 0.2–1.2)
BUN: 10 mg/dL (ref 7–25)
CHLORIDE: 103 mmol/L (ref 98–110)
CO2: 20 mmol/L (ref 20–31)
CREATININE: 0.74 mg/dL (ref 0.60–1.35)
Calcium: 9 mg/dL (ref 8.6–10.3)
GFR, Est African American: >89 mL/min (ref >=60)
GFR, Est Non African American: >89 mL/min (ref >=60)
GLUCOSE: 86 mg/dL (ref 65–99)
Potassium: 4.4 mmol/L (ref 3.5–5.3)
SODIUM: 135 mmol/L (ref 135–146)
TOTAL PROTEIN: 7 g/dL (ref 6.1–8.1)

## 2016-02-13 LAB — RPR

## 2016-02-13 LAB — PHENYTOIN LEVEL, TOTAL

## 2016-02-13 LAB — HIV-1 RNA QUANT-NO REFLEX-BLD
HIV 1 RNA Quant: <20 copies/mL (ref <20)
HIV-1 RNA Quant, Log: <1.3 Log copies/mL (ref <1.30)

## 2016-02-13 LAB — T-HELPER CELL (CD4) - (RCID CLINIC ONLY)
CD4 % Helper T Cell: 45 % (ref 33–55)
CD4 T Cell Abs: 1050 /uL (ref 400–2700)

## 2016-02-14 ENCOUNTER — Other Ambulatory Visit: Payer: Self-pay | Admitting: Family Medicine

## 2016-02-14 DIAGNOSIS — G40901 Epilepsy, unspecified, not intractable, with status epilepticus: Secondary | ICD-10-CM

## 2016-02-14 MED ORDER — PHENYTOIN SODIUM EXTENDED 100 MG PO CAPS: 100.0000 mg | ORAL_CAPSULE | Freq: Three times a day (TID) | ORAL | Status: DC

## 2016-02-14 MED ORDER — LEVETIRACETAM 500 MG PO TABS: 500.0000 mg | ORAL_TABLET | Freq: Two times a day (BID) | ORAL | Status: DC

## 2016-02-17 ENCOUNTER — Ambulatory Visit: Payer: Self-pay | Admitting: Infectious Diseases

## 2016-03-01 ENCOUNTER — Other Ambulatory Visit: Payer: Self-pay | Admitting: Infectious Diseases

## 2016-03-06 ENCOUNTER — Emergency Department (HOSPITAL_COMMUNITY): Payer: Self-pay

## 2016-03-06 ENCOUNTER — Encounter (HOSPITAL_COMMUNITY): Payer: Self-pay | Admitting: Emergency Medicine

## 2016-03-06 ENCOUNTER — Emergency Department (HOSPITAL_COMMUNITY)
Admission: EM | Admit: 2016-03-06 | Discharge: 2016-03-06 | Disposition: A | Discharge status: Home / Self Care | Payer: Self-pay | Attending: Emergency Medicine | Admitting: Emergency Medicine

## 2016-03-06 DIAGNOSIS — Y929 Unspecified place or not applicable: Secondary | ICD-10-CM | POA: Insufficient documentation

## 2016-03-06 DIAGNOSIS — F17210 Nicotine dependence, cigarettes, uncomplicated: Secondary | ICD-10-CM | POA: Insufficient documentation

## 2016-03-06 DIAGNOSIS — R0789 Other chest pain: Secondary | ICD-10-CM

## 2016-03-06 DIAGNOSIS — W19XXXA Unspecified fall, initial encounter: Secondary | ICD-10-CM

## 2016-03-06 DIAGNOSIS — Y999 Unspecified external cause status: Secondary | ICD-10-CM | POA: Insufficient documentation

## 2016-03-06 DIAGNOSIS — W01198A Fall on same level from slipping, tripping and stumbling with subsequent striking against other object, initial encounter: Secondary | ICD-10-CM | POA: Insufficient documentation

## 2016-03-06 DIAGNOSIS — S20219A Contusion of unspecified front wall of thorax, initial encounter: Secondary | ICD-10-CM | POA: Insufficient documentation

## 2016-03-06 DIAGNOSIS — I10 Essential (primary) hypertension: Secondary | ICD-10-CM | POA: Insufficient documentation

## 2016-03-06 DIAGNOSIS — S20212A Contusion of left front wall of thorax, initial encounter: Secondary | ICD-10-CM

## 2016-03-06 DIAGNOSIS — Y93E1 Activity, personal bathing and showering: Secondary | ICD-10-CM | POA: Insufficient documentation

## 2016-03-06 DIAGNOSIS — Z79899 Other long term (current) drug therapy: Secondary | ICD-10-CM | POA: Insufficient documentation

## 2016-03-06 LAB — I-STAT CHEM 8, ED
BUN: 12 mg/dL (ref 6–20)
CREATININE: 0.6 mg/dL — AB (ref 0.61–1.24)
Calcium, Ion: 1.19 mmol/L (ref 1.12–1.23)
Chloride: 105 mmol/L (ref 101–111)
GLUCOSE: 92 mg/dL (ref 65–99)
HCT: 46 % (ref 39.0–52.0)
HEMOGLOBIN: 15.6 g/dL (ref 13.0–17.0)
Potassium: 4 mmol/L (ref 3.5–5.1)
Sodium: 141 mmol/L (ref 135–145)
TCO2: 26 mmol/L (ref 0–100)

## 2016-03-06 LAB — CBC
HEMATOCRIT: 44.7 % (ref 39.0–52.0)
Hemoglobin: 15.3 g/dL (ref 13.0–17.0)
MCH: 32.2 pg (ref 26.0–34.0)
MCHC: 34.2 g/dL (ref 30.0–36.0)
MCV: 94.1 fL (ref 78.0–100.0)
Platelets: 272 10*3/uL (ref 150–400)
RBC: 4.75 MIL/uL (ref 4.22–5.81)
RDW: 13.6 % (ref 11.5–15.5)
WBC: 8.4 10*3/uL (ref 4.0–10.5)

## 2016-03-06 LAB — I-STAT TROPONIN, ED: Troponin i, poc: 0 ng/mL (ref 0.00–0.08)

## 2016-03-06 MED ORDER — HYDROCODONE-ACETAMINOPHEN 5-325 MG PO TABS: 1.0000 | ORAL_TABLET | Freq: Four times a day (QID) | ORAL | Status: DC | PRN

## 2016-03-06 MED ORDER — HYDROCODONE-ACETAMINOPHEN 5-325 MG PO TABS
1.0000 | ORAL_TABLET | Freq: Once | ORAL | Status: AC
  Administered 2016-03-06: 1 via ORAL
  Filled 2016-03-06: qty 1

## 2016-03-06 MED ORDER — NAPROXEN 250 MG PO TABS: 250.0000 mg | ORAL_TABLET | Freq: Two times a day (BID) | ORAL | Status: DC | PRN

## 2016-03-06 MED ORDER — NAPROXEN 500 MG PO TABS
250.0000 mg | ORAL_TABLET | Freq: Once | ORAL | Status: AC
  Administered 2016-03-06: 250 mg via ORAL
  Filled 2016-03-06: qty 1

## 2016-03-06 NOTE — ED Notes (Signed)
Pt has history of epilepsy, states he had an epileptic episode in the shower and fell about 4 days ago. Feels like the pain is either is sternum or ribs.

## 2016-03-06 NOTE — Discharge Instructions (Signed)
Take naprosyn as directed for inflammation and pain with norco for breakthrough pain. Do not drive or operate machinery with pain medication use. Use ice and/or heat to areas of soreness, no more than 20 minutes at a time every hour for each. Expect to be sore for the next few days/weeks. Make sure that every hour you are taking deep breaths and coughing to prevent development of pneumonia. Follow up with primary care physician for recheck of ongoing symptoms in the next 1 week. Return to ER for emergent changing or worsening of symptoms.     Chest Contusion A contusion is a deep bruise. Bruises happen when an injury causes bleeding under the skin. Signs of bruising include pain, puffiness (swelling), and discolored skin. The bruise may turn blue, purple, or yellow.  HOME CARE  Put ice on the injured area.  Put ice in a plastic bag.  Place a towel between the skin and the bag.  Leave the ice on for 15-20 minutes at a time, 03-04 times a day for the first 48 hours.  Only take medicine as told by your doctor.  Rest.  Take deep breaths (deep-breathing exercises) as told by your doctor.  Stop smoking if you smoke.  Do not lift objects over 5 pounds (2.3 kilograms) for 3 days or longer if told by your doctor. GET HELP RIGHT AWAY IF:   You have more bruising or puffiness.  You have pain that gets worse.  You have trouble breathing.  You are dizzy, weak, or pass out (faint).  You have blood in your pee (urine) or poop (stool).  You cough up or throw up (vomit) blood.  Your puffiness or pain is not helped with medicines. MAKE SURE YOU:   Understand these instructions.  Will watch your condition.  Will get help right away if you are not doing well or get worse.   This information is not intended to replace advice given to you by your health care provider. Make sure you discuss any questions you have with your health care provider.   Document Released: 03/02/2008 Document  Revised: 06/08/2012 Document Reviewed: 03/07/2012 Elsevier Interactive Patient Education 2016 Else[employer] Inc.  Chest Wall Pain Chest wall pain is pain in or around the bones and muscles of your chest. Sometimes, an injury causes this pain. Sometimes, the cause may not be known. This pain may take several weeks or longer to get better. HOME CARE Pay attention to any changes in your symptoms. Take these actions to help with your pain:  Rest as told by your doctor.  Avoid activities that cause pain. Try not to use your chest, belly (abdominal), or side muscles to lift heavy things.  If directed, apply ice to the painful area:  Put ice in a plastic bag.  Place a towel between your skin and the bag.  Leave the ice on for 20 minutes, 2-3 times per day.  Take over-the-counter and prescription medicines only as told by your doctor.  Do not use tobacco products, including cigarettes, chewing tobacco, and e-cigarettes. If you need help quitting, ask your doctor.  Keep all follow-up visits as told by your doctor. This is important. GET HELP IF:  You have a fever.  Your chest pain gets worse.  You have new symptoms. GET HELP RIGHT AWAY IF:  You feel sick to your stomach (nauseous) or you throw up (vomit).  You feel sweaty or light-headed.  You have a cough with phlegm (sputum) or you cough up blood.  You are short of breath.   This information is not intended to replace advice given to you by your health care provider. Make sure you discuss any questions you have with your health care provider.   Document Released: 03/02/2008 Document Revised: 06/05/2015 Document Reviewed: 12/10/2014 Elsevier Interactive Patient Education 2016 Elsevier Inc.  Cryotherapy Cryotherapy is when you put ice on your injury. Ice helps lessen pain and puffiness (swelling) after an injury. Ice works the best when you start using it in the first 24 to 48 hours after an injury. HOME CARE  Put a dry or damp  towel between the ice pack and your skin.  You may press gently on the ice pack.  Leave the ice on for no more than 10 to 20 minutes at a time.  Check your skin after 5 minutes to make sure your skin is okay.  Rest at least 20 minutes between ice pack uses.  Stop using ice when your skin loses feeling (numbness).  Do not use ice on someone who cannot tell you when it hurts. This includes small children and people with memory problems (dementia). GET HELP RIGHT AWAY IF:  You have white spots on your skin.  Your skin turns blue or pale.  Your skin feels waxy or hard.  Your puffiness gets worse. MAKE SURE YOU:   Understand these instructions.  Will watch your condition.  Will get help right away if you are not doing well or get worse.   This information is not intended to replace advice given to you by your health care provider. Make sure you discuss any questions you have with your health care provider.   Document Released: 03/02/2008 Document Revised: 12/07/2011 Document Reviewed: 05/07/2011 Elsevier Interactive Patient Education [employer]

## 2016-03-06 NOTE — ED Provider Notes (Signed)
CSN: [id number]     Arrival date & time 03/06/16  1539 History  By signing my name below, I, [facility], attest that this documentation has been prepared under the direction and in the presence of Abiola Behring Camprubi-Soms, PA-C. Electronically Signed: Randell Patient, ED Scribe. 03/06/2016. 5:55 PM.   Chief Complaint  Patient presents with  . rib cage pain     Patient is a 35 y.o. male presenting with chest pain. The history is provided by the patient and medical records. No language interpreter was used.  Chest Pain Pain location:  L chest and L lateral chest Pain quality: aching   Pain radiates to:  Does not radiate Pain radiates to the back: no   Pain severity:  Moderate Onset quality:  Gradual Duration:  4 days Timing:  Constant Progression:  Worsening Chronicity:  New Context: trauma (fall in shower)   Relieved by:  Nothing Worsened by:  Certain positions, coughing and movement Ineffective treatments:  Rest (tylenol, motrin, and icy hot) Associated symptoms: no abdominal pain, no diaphoresis, no fever, no lower extremity edema, no nausea, no numbness, no shortness of breath, not vomiting and no weakness   Risk factors: hypertension and smoking   Risk factors: no coronary artery disease, no diabetes mellitus, no high cholesterol, no immobilization, no prior DVT/PE and no surgery    HPI Comments: Javier Hill is a 35 y.o. male with a PMHx of HIV (last CD4 1050 on 02/12/16), epilepsy, HTN, anxiety, and panic attacks, who presents to the Emergency Department complaining of constant, gradually worsening, aching nonradiating 8/10 left-sided chest wall pain, worse with coughing, breathing, talking, and changing positions/chest wall movement, onset 4 days ago after a fall. Pt states that he has a hx of epilepsy and had a epileptic episode while showering 4 days ago that caused him to fall and strike the left side of his chest on the shower door and followed by gradual pain  in his chest. He reports associated intermittent lightheadedness due to pain, but this is not ongoing currently. He has taken Tylenol, ibuprofen, and applied a Icy Hot patch without relief. He notes that he smoke occasionally. Denies recent surgeries, travels, or immobilization. Denies personal or family hx of DVT/PEs. Denies fevers, chills, diaphoresis, SOB, leg swelling, nausea, vomiting, abdominal pain, diarrhea, constipation, dysuria, hematuria, myalgias, arthralgias, tingling, weakness, lightheadedness currently, and numbness. Denies head inj or LOC, no other injuries reported. No family hx of cardiac disease that he's aware of. PCP Johny Sax, MD. Compliant with his HIV meds.   Past Medical History  Diagnosis Date  . Hypertension   . Anxiety epi  . HIV (human immunodeficiency virus infection) (HCC)   . Seizure (HCC)     last seizure 09/05/2014  . Epilepsy (HCC)   . Migraines 2016  . Panic attack    Past Surgical History  Procedure Laterality Date  . Orif clavicular fracture     Family History  Problem Relation Age of Onset  . Adopted: Yes   Social History  Substance Use Topics  . Smoking status: Current Every Day Smoker -- 0.10 packs/day    Types: Cigarettes  . Smokeless tobacco: Never Used     Comment: cutting back  . Alcohol Use: 0.0 oz/week    0 Standard drinks or equivalent per week     Comment: occasional     Review of Systems  Constitutional: Negative for fever, chills and diaphoresis.  Respiratory: Negative for shortness of breath.   Cardiovascular: Positive for chest  pain. Negative for leg swelling.  Gastrointestinal: Negative for nausea, vomiting, abdominal pain, diarrhea and constipation.  Genitourinary: Negative for dysuria and hematuria.  Musculoskeletal: Negative for myalgias and arthralgias.  Skin: Negative for color change.  Allergic/Immunologic: Positive for immunocompromised state (HIV+).  Neurological: Positive for light-headedness (intermittent,  none currently, due to pain). Negative for weakness and numbness.  Psychiatric/Behavioral: Negative for confusion.  10 Systems reviewed and are negative for acute change except as noted in the HPI.   Allergies  Caffeine; Ciprofloxacin; Penicillins; Sulfamethoxazole-trimethoprim; Tramadol; Acyclovir and related; Haldol; Librium; Aloprim; Ambien; Bactrim; Other; Pneumovax; Prevnar; Watermelon flavor; and Zoloft  Home Medications   Prior to Admission medications   Medication Sig Start Date End Date Taking? Authorizing Provider  acetaminophen (TYLENOL) 500 MG tablet Take 1 tablet (500 mg total) by mouth every 6 (six) hours as needed. 08/18/15   Mady Gemma, PA-C  anti-nausea (EMETROL) solution Take 10 mLs by mouth every 15 (fifteen) minutes as needed for nausea or vomiting.    Historical Provider, MD  clonazePAM (KLONOPIN) 1 MG tablet Take 1 tablet (1 mg total) by mouth 2 (two) times daily as needed for anxiety. 01/05/15   Rolan Bucco, MD  diclofenac sodium (VOLTAREN) 1 % GEL Apply 1 application topically 4 (four) times daily. Patient not taking: Reported on 08/18/2015 07/23/15   Hayden Rasmussen, NP  emtricitabine-tenofovir (TRUVADA) 200-300 MG tablet Take 1 tablet by mouth daily. 12/23/15   Ginnie Smart, MD  HYDROcodone-acetaminophen (NORCO/VICODIN) 5-325 MG tablet Take 1-2 tablets by mouth every 6 (six) hours as needed for moderate pain or severe pain.    Historical Provider, MD  ISENTRESS 400 MG tablet TAKE 1 TABLET(400 MG) BY MOUTH TWICE DAILY 03/02/16   Ginnie Smart, MD  levETIRAcetam (KEPPRA) 500 MG tablet Take 1 tablet (500 mg total) by mouth 2 (two) times daily. 02/14/16   Massie Maroon, FNP  lisinopril (PRINIVIL,ZESTRIL) 20 MG tablet Take 1 tablet (20 mg total) by mouth daily. 03/29/15   Henrietta Hoover, NP  lisinopril (PRINIVIL,ZESTRIL) 20 MG tablet TAKE 1 TABLET BY MOUTH DAILY 01/22/16   Henrietta Hoover, NP  methocarbamol (ROBAXIN) 500 MG tablet Take 1 tablet (500 mg  total) by mouth 2 (two) times daily. 08/18/15   Mady Gemma, PA-C  nystatin (MYCOSTATIN) 100000 UNIT/ML suspension Take 5 mLs (500,000 Units total) by mouth 4 (four) times daily. 08/18/15   Mady Gemma, PA-C  omeprazole (PRILOSEC OTC) 20 MG tablet Take 1 tablet (20 mg total) by mouth daily. 09/09/15   Henrietta Hoover, NP  phenytoin (DILANTIN) 100 MG ER capsule Take 1 capsule (100 mg total) by mouth 3 (three) times daily. 02/14/16   Massie Maroon, FNP  raltegravir (ISENTRESS) 400 MG tablet Take 1 tablet (400 mg total) by mouth 2 (two) times daily. 12/23/15   Ginnie Smart, MD  TRUVADA 200-300 MG tablet TAKE 1 TABLET BY MOUTH EVERY DAY 12/24/15   Ginnie Smart, MD   BP 136/85 mmHg  Pulse 89  Temp(Src) 98.2 F (36.8 C) (Oral)  Resp 18  Ht 5\' 4"  (1.626 m)  Wt 130 lb (58.968 kg)  BMI 22.30 kg/m2  SpO2 100% Physical Exam  Constitutional: He is oriented to person, place, and time. Vital signs are normal. He appears well-developed and well-nourished.  Non-toxic appearance. No distress.  Afebrile, nontoxic, NAD  HENT:  Head: Normocephalic and atraumatic.  Mouth/Throat: Oropharynx is clear and moist and mucous membranes are normal.  Eyes: Conjunctivae and  EOM are normal. Right eye exhibits no discharge. Left eye exhibits no discharge.  Neck: Normal range of motion. Neck supple.  Cardiovascular: Normal rate, regular rhythm, normal heart sounds and intact distal pulses.  Exam reveals no gallop and no friction rub.   No murmur heard. RRR, nl s1/s2, no m/r/g, distal pulses intact, no pedal edema  Pulmonary/Chest: Effort normal and breath sounds normal. No respiratory distress. He has no decreased breath sounds. He has no wheezes. He has no rhonchi. He has no rales. He exhibits tenderness. He exhibits no crepitus, no deformity, no swelling and no retraction.    CTAB in all lung fields, no w/r/r, no hypoxia or increased WOB, speaking in full sentences, SpO2 100% on RA Chest  wall with mild diffuse left-sided TTP, without bruising or swelling, no subQ emphysema, and without crepitus, deformities, or retractions  Abdominal: Soft. Normal appearance and bowel sounds are normal. He exhibits no distension. There is no tenderness. There is no rigidity, no rebound, no guarding, no CVA tenderness, no tenderness at McBurney's point and negative Murphy's sign.  Musculoskeletal: Normal range of motion.  MAE x4 Strength and sensation grossly intact Distal pulses intact Gait steady No pedal edema  Neurological: He is alert and oriented to person, place, and time. He has normal strength. No sensory deficit.  Skin: Skin is warm, dry and intact. No rash noted.  Psychiatric: He has a normal mood and affect.  Nursing note and vitals reviewed.   ED Course  Procedures   DIAGNOSTIC STUDIES: Oxygen Saturation is 100% on RA, normal by my interpretation.    COORDINATION OF CARE: 5:10 PM Discussed results of chest x-ray. Will order labs. Discussed treatment plan with pt at bedside and pt agreed to plan.   Labs Review Labs Reviewed  I-STAT CHEM 8, ED - Abnormal; Notable for the following:    Creatinine, Ser 0.60 (*)    All other components within normal limits  CBC  I-STAT TROPOININ, ED    Imaging Review Dg Chest 2 View  03/06/2016  CLINICAL DATA:  Initial encounter for Pt has history of epilepsy, states he had an epileptic episode in the shower and fell approx. 2 days ago. C/o pain to anterior and superior left ribs. H/o HTN. Smoker. EXAM: CHEST  2 VIEW COMPARISON:  08/18/2015 FINDINGS: Midline trachea. Normal heart size and mediastinal contours. No pleural effusion or pneumothorax. Clear lungs. IMPRESSION: No active cardiopulmonary disease. Electronically Signed   By: Jeronimo Greaves M.D.   On: 03/06/2016 16:42   I have personally reviewed and evaluated these images and lab results as part of my medical decision-making.   EKG Interpretation None      MDM   Final  diagnoses:  Chest wall pain  Fall, initial encounter  Chest wall contusion, left, initial encounter    35 y.o. male here with 3-4 days of L chest wall pain s/p fall after seizure. Pain with palpation of chest wall, no crepitus or subQ air, no bruising or abrasions. NVI in all extremities. CXR obtained in triage which is negative, could be occult rib fx still but discussed that nothing found on xray. He states sometimes the pain is so severe he gets lightheaded intermittently. Given this, will obtain EKG and basic labs to ensure no cardiac etiology or other concerning findings. Likely just pain related though. PERC neg and no SOB, doubt need for dimer or CTA. Will give pain meds and reassess after labs/EKG.  6:29 PM EKG unremarkable, chem 8 WNL, trop  neg, and CBC WNL. Overall reassuring, pain improving after pain meds. Likely musculoskeletal/costochondral injury, could be occult rib fx. Discussed pulmonary toilet importance. Will give pain meds and naprosyn. Discussed use of ice/heat. F/up with PCP in 1wk. I explained the diagnosis and have given explicit precautions to return to the ER including for any other new or worsening symptoms. The patient understands and accepts the medical plan as it's been dictated and I have answered their questions. Discharge instructions concerning home care and prescriptions have been given. The patient is STABLE and is discharged to home in good condition.   I personally performed the services described in this documentation, which was scribed in my presence. The recorded information has been reviewed and is accurate.  BP 136/85 mmHg  Pulse 89  Temp(Src) 98.2 F (36.8 C) (Oral)  Resp 18  Ht 5\' 4"  (1.626 m)  Wt 58.968 kg  BMI 22.30 kg/m2  SpO2 100%  Meds ordered this encounter  Medications  . HYDROcodone-acetaminophen (NORCO/VICODIN) 5-325 MG per tablet 1 tablet    Sig:   . naproxen (NAPROSYN) tablet 250 mg    Sig:   . HYDROcodone-acetaminophen (NORCO)  5-325 MG tablet    Sig: Take 1 tablet by mouth every 6 (six) hours as needed for severe pain.    Dispense:  6 tablet    Refill:  0    Order Specific Question:  Supervising Provider    Answer:  MILLER, BRIAN [[id number]]  . naproxen (NAPROSYN) 250 MG tablet    Sig: Take 1 tablet (250 mg total) by mouth 2 (two) times daily as needed for mild pain, moderate pain or headache (TAKE WITH MEALS.).    Dispense:  20 tablet    Refill:  0    Order Specific Question:  Supervising Provider    Answer:  Eber HongMILLER, BRIAN [[id number]]      Rayhana Slider Camprubi-Soms, PA-C 03/06/16 1836  Bethann BerkshireJoseph Zammit, MD 03/06/16 2145

## 2016-03-12 ENCOUNTER — Ambulatory Visit: Payer: Self-pay | Admitting: Infectious Diseases

## 2016-03-13 ENCOUNTER — Encounter: Payer: Self-pay | Admitting: Infectious Diseases

## 2016-03-13 ENCOUNTER — Ambulatory Visit (INDEPENDENT_AMBULATORY_CARE_PROVIDER_SITE_OTHER): Payer: Self-pay | Admitting: Infectious Diseases

## 2016-03-13 VITALS — BP 121/84 | HR 59 | Temp 98.2°F | Ht 64.0 in | Wt 129.0 lb

## 2016-03-13 DIAGNOSIS — F410 Panic disorder [episodic paroxysmal anxiety] without agoraphobia: Secondary | ICD-10-CM

## 2016-03-13 DIAGNOSIS — F064 Anxiety disorder due to known physiological condition: Secondary | ICD-10-CM

## 2016-03-13 DIAGNOSIS — Z23 Encounter for immunization: Secondary | ICD-10-CM

## 2016-03-13 DIAGNOSIS — G40901 Epilepsy, unspecified, not intractable, with status epilepticus: Secondary | ICD-10-CM

## 2016-03-13 DIAGNOSIS — I10 Essential (primary) hypertension: Secondary | ICD-10-CM

## 2016-03-13 DIAGNOSIS — Z21 Asymptomatic human immunodeficiency virus [HIV] infection status: Secondary | ICD-10-CM

## 2016-03-13 DIAGNOSIS — B20 Human immunodeficiency virus [HIV] disease: Secondary | ICD-10-CM

## 2016-03-13 DIAGNOSIS — R569 Unspecified convulsions: Secondary | ICD-10-CM

## 2016-03-13 DIAGNOSIS — Z113 Encounter for screening for infections with a predominantly sexual mode of transmission: Secondary | ICD-10-CM

## 2016-03-13 NOTE — Assessment & Plan Note (Signed)
Has f/u visit with PCP this month.  Still needs neuro.

## 2016-03-13 NOTE — Assessment & Plan Note (Signed)
He is doing very well.  Offered to switch his ART (descovy and possibly new qd ISN in next 6 months) but he is afraid he will have sfx.  He has condoms. He is here with partner.  Will see him back in 6 months

## 2016-03-13 NOTE — Addendum Note (Signed)
Addended by: Rejeana BrockMURRAY, Ailyn Gladd A on: 03/13/2016 12:36 PM   Modules accepted: Orders

## 2016-03-13 NOTE — Progress Notes (Signed)
   Subjective:    Patient ID: Javier Hill, male    DOB: 1981-02-26, 35 y.o.   MRN: [id number]  HPI 35 yo M with hx of HIV+ since 2013, currently on ISN/TRV (his only regiemen). Previously offered FDC but refused.  He also has a hx of seizures and HTN.  Has had repeat sx in last week. States he has his anti-epileptic, has not been seen by neuro.  No problems with ART.  Has his anti-htn rx.  Has /fu appt with PCP this month.  Has lost 16#, attributes this to anxiety, insomnia. Has not been able to get into [facility] due to lack os SSN. Has been getting help from [facility].   HIV 1 RNA QUANT (copies/mL)  Date Value  02/12/2016 <20  05/06/2015 <20  10/23/2014 <20   CD4 T CELL ABS (/uL)  Date Value  02/12/2016 1050  05/06/2015 850  10/23/2014 1070    Review of Systems  Constitutional: Positive for appetite change and unexpected weight change.  Gastrointestinal: Negative for diarrhea and constipation.  Genitourinary: Negative for difficulty urinating.  Psychiatric/Behavioral: Positive for sleep disturbance. The patient is nervous/anxious.        Objective:   Physical Exam  Constitutional: He appears well-developed and well-nourished.  HENT:  Mouth/Throat: No oropharyngeal exudate.  Eyes: EOM are normal. Pupils are equal, round, and reactive to light.  Neck: Neck supple.  Cardiovascular: Normal rate, regular rhythm and normal heart sounds.   Pulmonary/Chest: Effort normal and breath sounds normal.  Abdominal: Soft. Bowel sounds are normal. There is no tenderness. There is no rebound.  Musculoskeletal: He exhibits no edema.  Lymphadenopathy:    He has no cervical adenopathy.  Psychiatric: He has a normal mood and affect.      Assessment & Plan:

## 2016-03-13 NOTE — Assessment & Plan Note (Signed)
Needs orange card, neuro eval

## 2016-03-13 NOTE — Assessment & Plan Note (Signed)
He is well controlled.  Will have f/u visit with PCP this month

## 2016-03-13 NOTE — Addendum Note (Signed)
Addended by: Mariea ClontsGREEN, Yerik Zeringue D on: 03/13/2016 11:51 AM   Modules accepted: Orders

## 2016-03-13 NOTE — Assessment & Plan Note (Signed)
Will get him with [facility]

## 2016-03-17 ENCOUNTER — Ambulatory Visit: Payer: Self-pay | Admitting: Family Medicine

## 2016-03-17 LAB — URINE CYTOLOGY ANCILLARY ONLY
Chlamydia: NEGATIVE
Neisseria Gonorrhea: NEGATIVE

## 2016-03-18 ENCOUNTER — Ambulatory Visit: Payer: Self-pay | Admitting: *Deleted

## 2016-03-25 ENCOUNTER — Other Ambulatory Visit: Payer: Self-pay | Admitting: *Deleted

## 2016-03-25 MED ORDER — RALTEGRAVIR POTASSIUM 400 MG PO TABS: ORAL_TABLET | ORAL | Status: DC

## 2016-04-01 ENCOUNTER — Telehealth: Payer: Self-pay

## 2016-04-01 NOTE — Telephone Encounter (Signed)
Patient needs an appointment, Has not been seen here in 1 year. May need lab work for this to be refilled. Thanks!

## 2016-04-01 NOTE — Telephone Encounter (Signed)
Pt is requesting a medication refill on his Kepra. Thanks!

## 2016-04-06 IMAGING — CR DG CHEST 2V
2 series · 2 of 2 positions shown · non-contrast
Comparison: 06/14/2015

CLINICAL DATA: Fever and sore throat.  Vomiting.

EXAM:
CHEST  2 VIEW

[w chest pa]
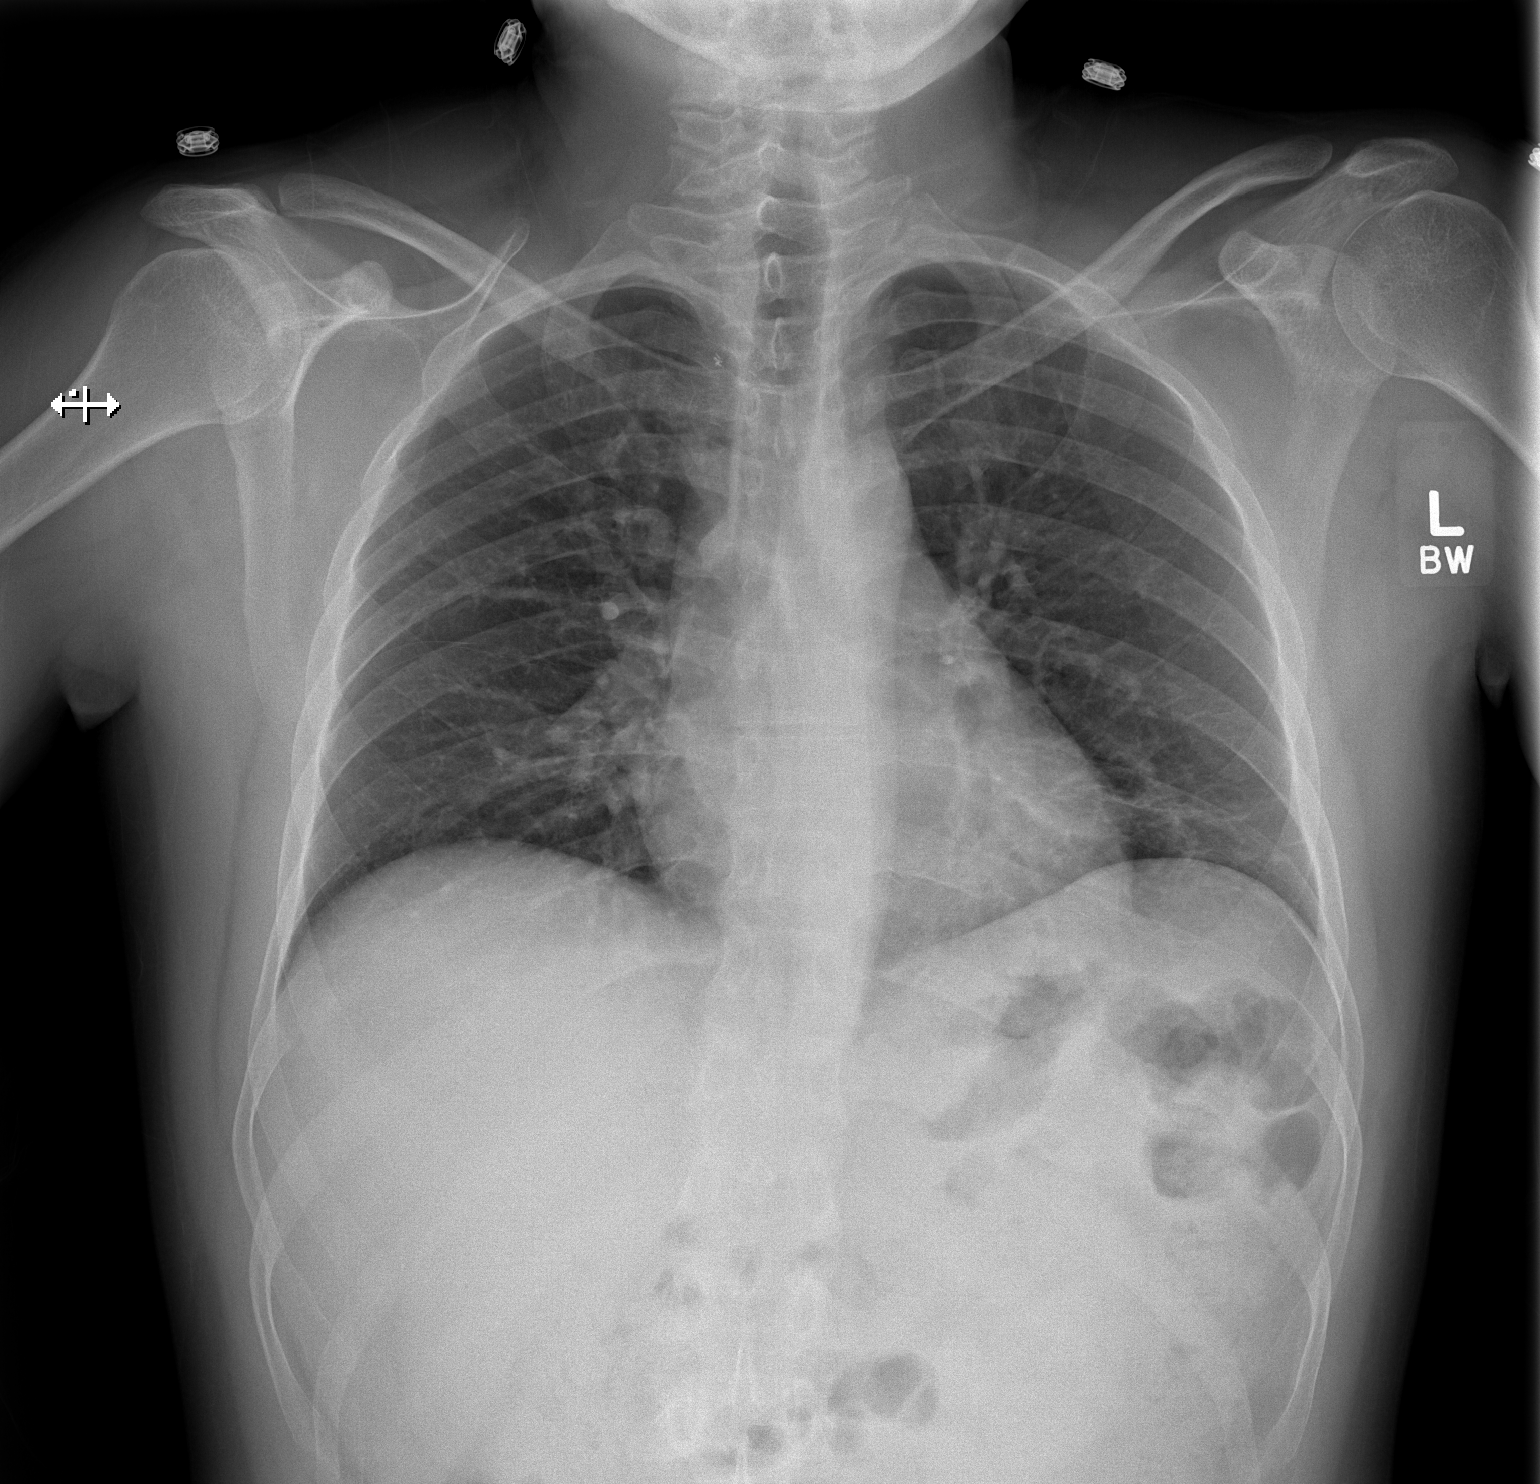

[w chest lat]
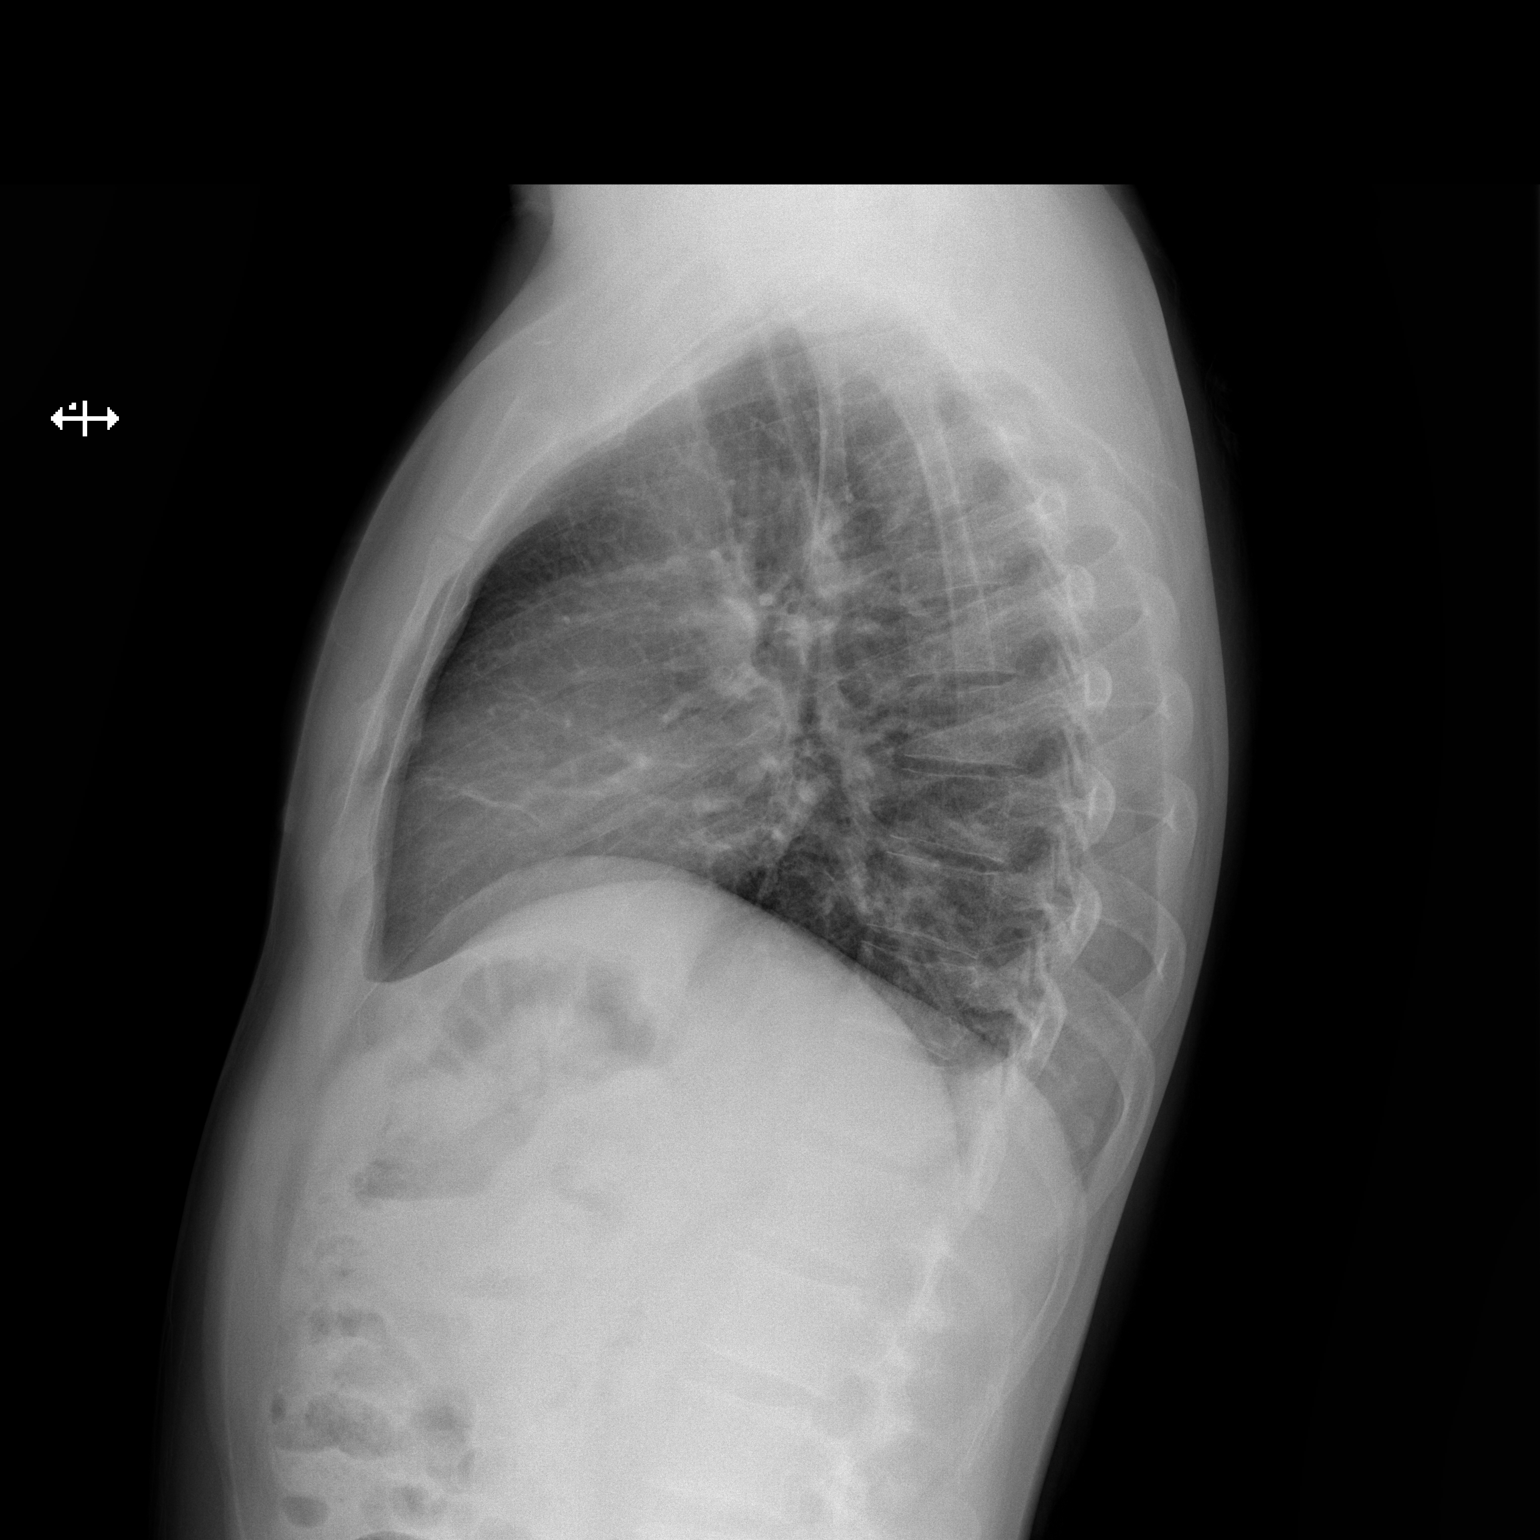

[2 of 2 positions shown; findings below may reference images not displayed]

FINDINGS: There is slight peribronchial thickening as well slight atelectasis
at the left lung base anteriorly. No consolidative infiltrates or
effusions. Heart size and vascularity are normal. No acute osseous
abnormality.
IMPRESSION: Bronchitic changes with slight atelectasis in the lingula.

## 2016-04-08 ENCOUNTER — Other Ambulatory Visit: Payer: Self-pay | Admitting: Family Medicine

## 2016-04-17 ENCOUNTER — Ambulatory Visit: Payer: Self-pay | Admitting: Family Medicine

## 2016-04-24 ENCOUNTER — Other Ambulatory Visit: Payer: Self-pay | Admitting: Family Medicine

## 2016-04-24 ENCOUNTER — Other Ambulatory Visit: Payer: Self-pay | Admitting: Infectious Diseases

## 2016-04-24 DIAGNOSIS — G40901 Epilepsy, unspecified, not intractable, with status epilepticus: Secondary | ICD-10-CM

## 2016-04-25 ENCOUNTER — Other Ambulatory Visit: Payer: Self-pay | Admitting: Family Medicine

## 2016-05-07 ENCOUNTER — Encounter: Payer: Self-pay | Admitting: Family Medicine

## 2016-05-07 ENCOUNTER — Ambulatory Visit (INDEPENDENT_AMBULATORY_CARE_PROVIDER_SITE_OTHER): Payer: Self-pay | Admitting: Family Medicine

## 2016-05-07 VITALS — BP 122/81 | HR 78 | Temp 99.1°F | Resp 16 | Ht 64.0 in | Wt 140.0 lb

## 2016-05-07 DIAGNOSIS — K219 Gastro-esophageal reflux disease without esophagitis: Secondary | ICD-10-CM

## 2016-05-07 DIAGNOSIS — G40909 Epilepsy, unspecified, not intractable, without status epilepticus: Secondary | ICD-10-CM

## 2016-05-07 DIAGNOSIS — G40901 Epilepsy, unspecified, not intractable, with status epilepticus: Secondary | ICD-10-CM

## 2016-05-07 DIAGNOSIS — R569 Unspecified convulsions: Secondary | ICD-10-CM

## 2016-05-07 DIAGNOSIS — M549 Dorsalgia, unspecified: Secondary | ICD-10-CM

## 2016-05-07 LAB — COMPLETE METABOLIC PANEL WITH GFR
ALT: 34 U/L (ref 9–46)
AST: 22 U/L (ref 10–40)
Albumin: 4.6 g/dL (ref 3.6–5.1)
Alkaline Phosphatase: 119 U/L — ABNORMAL HIGH (ref 40–115)
BUN: 7 mg/dL (ref 7–25)
CHLORIDE: 102 mmol/L (ref 98–110)
CO2: 24 mmol/L (ref 20–31)
Calcium: 9.4 mg/dL (ref 8.6–10.3)
Creat: 0.67 mg/dL (ref 0.60–1.35)
GFR, Est African American: >89 mL/min (ref >=60)
GFR, Est Non African American: >89 mL/min (ref >=60)
GLUCOSE: 78 mg/dL (ref 65–99)
POTASSIUM: 4.3 mmol/L (ref 3.5–5.3)
SODIUM: 133 mmol/L — AB (ref 135–146)
Total Bilirubin: 0.3 mg/dL (ref 0.2–1.2)
Total Protein: 7.6 g/dL (ref 6.1–8.1)

## 2016-05-07 LAB — CBC WITH DIFFERENTIAL/PLATELET
BASOS ABS: 0 {cells}/uL (ref 0–200)
Basophils Relative: 0 %
Eosinophils Absolute: 57 cells/uL (ref 15–500)
Eosinophils Relative: 1 %
HEMATOCRIT: 47.7 % (ref 38.5–50.0)
HEMOGLOBIN: 16.4 g/dL (ref 13.2–17.1)
LYMPHS ABS: 1938 {cells}/uL (ref 850–3900)
Lymphocytes Relative: 34 %
MCH: 32.2 pg (ref 27.0–33.0)
MCHC: 34.4 g/dL (ref 32.0–36.0)
MCV: 93.5 fL (ref 80.0–100.0)
MONO ABS: 570 {cells}/uL (ref 200–950)
MPV: 10.5 fL (ref 7.5–12.5)
Monocytes Relative: 10 %
NEUTROS PCT: 55 %
Neutro Abs: 3135 cells/uL (ref 1500–7800)
Platelets: 240 10*3/uL (ref 140–400)
RBC: 5.1 MIL/uL (ref 4.20–5.80)
RDW: 14.2 % (ref 11.0–15.0)
WBC: 5.7 10*3/uL (ref 3.8–10.8)

## 2016-05-07 LAB — TSH: TSH: 0.79 mIU/L (ref 0.40–4.50)

## 2016-05-07 MED ORDER — NAPROXEN 250 MG PO TABS: 250.0000 mg | ORAL_TABLET | Freq: Two times a day (BID) | ORAL | 0 refills | Status: DC | PRN

## 2016-05-07 MED ORDER — LEVETIRACETAM 500 MG PO TABS: 500.0000 mg | ORAL_TABLET | Freq: Two times a day (BID) | ORAL | 0 refills | Status: DC

## 2016-05-07 MED ORDER — LISINOPRIL 20 MG PO TABS: 20.0000 mg | ORAL_TABLET | Freq: Every day | ORAL | 3 refills | Status: DC

## 2016-05-07 NOTE — Progress Notes (Signed)
Javier Hill, is a 35 y.o. male  ZOX:[id number]:[id number]  [id number]:[id number]  DOB - 02/24/1981  CC:  Chief Complaint  Patient presents with  . Oral Swelling    RAW ON INSIDE OF LIP   . Seizures    FOLLOW UP ON SEIZURES AND NEEDS MEDICATION   . Finger Injury    FINGER ON LEFT HAND INJRED YEARS AGO, IS IN PAIN   . Insomnia    TOUBLE FALLING AND STAYING ASLEEP   . Cyst    UNDER SKIN ON BACK.        HPI: Javier Hill is a 35 y.o. male here for follow-up on seizure disorder and needing a refill of his Keppra. He has been out for a couple of weeks and had a seizure last week. He is also on dilantin and is taking that. He is asking for medication for anxiety and sleep. I have explained with do not prescribe controlled substances and [facility]. He reports he is unable to be seen there as he does not have a [facility].He has a history of HIV and is followed by [facility]. He doctor there has also declined to prescribe him controlled medications. He complains about "painful" knots in his back. This has been assessed in ED and no knots found.   Past Medical History:  Diagnosis Date  . Anxiety epi  . Epilepsy (HCC)   . HIV (human immunodeficiency virus infection) (HCC)   . Hypertension   . Migraines 2016  . Panic attack   . Seizure [facility](HCC)    last seizure 09/05/2014   Current Outpatient Prescriptions on File Prior to Visit  Medication Sig Dispense Refill  . acetaminophen (TYLENOL) 500 MG tablet Take 1 tablet (500 mg total) by mouth every 6 (six) hours as needed. 30 tablet 0  . phenytoin (DILANTIN) 100 MG ER capsule Take 1 capsule (100 mg total) by mouth 3 (three) times daily. 90 capsule 1  . raltegravir (ISENTRESS) 400 MG tablet Take 1 tablet (400 mg total) by mouth 2 (two) times daily. 60 tablet 3  . raltegravir (ISENTRESS) 400 MG tablet TAKE 1 TABLET(400 MG) BY MOUTH TWICE DAILY 60 tablet 4  . TRUVADA 200-300 MG tablet TAKE 1 TABLET BY MOUTH EVERY DAY 30  tablet 5  . anti-nausea (EMETROL) solution Take 10 mLs by mouth every 15 (fifteen) minutes as needed for nausea or vomiting.    . clonazePAM (KLONOPIN) 1 MG tablet Take 1 tablet (1 mg total) by mouth 2 (two) times daily as needed for anxiety. (Patient not taking: Reported on 03/13/2016) 15 tablet 0  . diclofenac sodium (VOLTAREN) 1 % GEL Apply 1 application topically 4 (four) times daily. (Patient not taking: Reported on 08/18/2015) 100 g 0  . HYDROcodone-acetaminophen (NORCO) 5-325 MG tablet Take 1 tablet by mouth every 6 (six) hours as needed for severe pain. (Patient not taking: Reported on 03/13/2016) 6 tablet 0  . HYDROcodone-acetaminophen (NORCO/VICODIN) 5-325 MG tablet Take 1-2 tablets by mouth every 6 (six) hours as needed for moderate pain or severe pain. Reported on 03/13/2016    . methocarbamol (ROBAXIN) 500 MG tablet Take 1 tablet (500 mg total) by mouth 2 (two) times daily. (Patient not taking: Reported on 03/13/2016) 20 tablet 0  . nystatin (MYCOSTATIN) 100000 UNIT/ML suspension Take 5 mLs (500,000 Units total) by mouth 4 (four) times daily. (Patient not taking: Reported on 03/13/2016) 60 mL 0  . [DISCONTINUED] diphenhydrAMINE (BENADRYL) 25 MG tablet Take 1 tablet (25 mg  total) by mouth every 6 (six) hours. (Patient not taking: Reported on 08/21/2014) 20 tablet 0  . [DISCONTINUED] gabapentin (NEURONTIN) 300 MG capsule Take 1 capsule (300 mg total) by mouth 3 (three) times daily. (Patient not taking: Reported on 10/23/2014) 90 capsule 3  . [DISCONTINUED] omeprazole (PRILOSEC) 40 MG capsule Take 1 capsule (40 mg total) by mouth daily. (Patient not taking: Reported on 10/23/2014) 30 capsule 4  . [DISCONTINUED] promethazine (PHENERGAN) 25 MG tablet Take 1 tablet (25 mg total) by mouth every 6 (six) hours as needed for nausea or vomiting. 20 tablet 0   No current facility-administered medications on file prior to visit.    Family History  Problem Relation Age of Onset  . Adopted: Yes   Social  History   Social History  . Marital status: Single    Spouse name: N/A  . Number of children: N/A  . Years of education: N/A   Occupational History  . Not on file.   Social History Main Topics  . Smoking status: Current Every Day Smoker    Packs/day: 0.10    Types: Cigarettes  . Smokeless tobacco: Never Used     Comment: cutting back 2 CIGGERETS A DAY   . Alcohol use No     Comment: occasional   . Drug use:     Types: Marijuana     Comment: COUPLE TIMES A WEEK   . Sexual activity: Not on file   Other Topics Concern  . Not on file   Social History Narrative  . No narrative on file    Review of Systems: Constitutional: Negative for fever, chills, appetite change, weight loss,  Fatigue. Skin: Negative for rashes or lesions of concern.Perceived knots on back HENT: Negative for ear pain, ear discharge.nose bleeds Eyes: Negative for pain, discharge, redness, itching and visual disturbance. Neck: Negative for pain, stiffness Respiratory: Negative for cough, shortness of breath,   Cardiovascular: Negative for chest pain, palpitations and leg swelling. Gastrointestinal: Negative for abdominal pain,  vomiting, diarrhea. Occ nausea, constipation and heartburn Genitourinary: Negative for dysuria, urgency, frequency, hematuria,  Musculoskeletal: Negative for  joint pain, joint  swelling, and gait problem.Negative for weakness.Positive for low back pain. Positive for pain in finger. Neurological: Negative for dizziness, tremors,  syncope,   light-headedness, numbness and headaches. Positive for seizures Hematological: Negative for easy bruising or bleeding Psychiatric/Behavioral: Negative for depression. Positive for anxiety   Objective:   Vitals:   05/07/16 1127  BP: 122/81  Pulse: 78  Resp: 16  Temp: 99.1 F (37.3 C)    Physical Exam: Constitutional: Patient appears well-developed and well-nourished. No distress. HENT: Normocephalic, atraumatic, External right and left  ear normal. Oropharynx is clear and moist.  Eyes: Conjunctivae and EOM are normal. PERRLA, no scleral icterus. Neck: Normal ROM. Neck supple. No lymphadenopathy, No thyromegaly. CVS: RRR, S1/S2 +, no murmurs, no gallops, no rubs Pulmonary: Effort and breath sounds normal, no stridor, rhonchi, wheezes, rales.  Abdominal: Soft. Normoactive BS,, no distension, tenderness, rebound or guarding.  Musculoskeletal: Normal range of motion. No edema and no tenderness.  Neuro: Alert.Normal muscle tone coordination. Non-focal Skin: Skin is warm and dry. No rash noted. Not diaphoretic. No erythema. No pallor. Psychiatric: Normal mood and affect. Behavior, judgment, thought content normal.  Lab Results  Component Value Date   WBC 8.4 03/06/2016   HGB 15.3 03/06/2016   HCT 44.7 03/06/2016   MCV 94.1 03/06/2016   PLT 272 03/06/2016   Lab Results  Component Value Date  CREATININE 0.60 (L) 03/06/2016   BUN 12 03/06/2016   NA 141 03/06/2016   K 4.0 03/06/2016   CL 105 03/06/2016   CO2 20 02/12/2016    No results found for: HGBA1C Lipid Panel     Component Value Date/Time   CHOL 134 05/06/2015 1121   TRIG 103 05/06/2015 1121   HDL 42 05/06/2015 1121   CHOLHDL 3.2 05/06/2015 1121   VLDL 21 05/06/2015 1121   LDLCALC 71 05/06/2015 1121       Assessment and plan:   1. Seizure disorder (HCC)  - COMPLETE METABOLIC PANEL WITH GFR - CBC w/Diff - TSH - Dilantin (Phenytoin) level, total  2. Status epilepticus (HCC)  - levETIRAcetam (KEPPRA) 500 MG tablet; Take 1 tablet (500 mg total) by mouth 2 (two) times daily.  Dispense: 60 tablet; Refill: 0    3. Gastroesophageal reflux disease, esophagitis presence not specified, not controlled with Prilosec 20 daily. -Incease Prilosec to 40 mg daily  5. Back pain, unspecified location -Naproxen 250, one po bid with food, # 60.   Return in about 3 months (around 08/07/2016).  The patient was given clear instructions to go to ER or return to  medical center if symptoms don't improve, worsen or new problems develop. The patient verbalized understanding.    Henrietta Hoover FNP  05/07/2016, 2:40 PM

## 2016-05-07 NOTE — Patient Instructions (Signed)
Follow up in three months.   

## 2016-05-08 LAB — PHENYTOIN LEVEL, TOTAL

## 2016-06-08 ENCOUNTER — Other Ambulatory Visit: Payer: Self-pay | Admitting: Family Medicine

## 2016-06-08 DIAGNOSIS — G40901 Epilepsy, unspecified, not intractable, with status epilepticus: Secondary | ICD-10-CM

## 2016-06-08 MED ORDER — LEVETIRACETAM 500 MG PO TABS: 500.0000 mg | ORAL_TABLET | Freq: Two times a day (BID) | ORAL | 0 refills | Status: DC

## 2016-07-12 ENCOUNTER — Emergency Department (HOSPITAL_BASED_OUTPATIENT_CLINIC_OR_DEPARTMENT_OTHER): Payer: Self-pay

## 2016-07-12 ENCOUNTER — Inpatient Hospital Stay (HOSPITAL_BASED_OUTPATIENT_CLINIC_OR_DEPARTMENT_OTHER)
Admission: EM | Admit: 2016-07-12 | Discharge: 2016-07-13 | DRG: 379 | Discharge status: Against Medical Advice | Payer: Self-pay | Attending: Internal Medicine | Admitting: Internal Medicine

## 2016-07-12 ENCOUNTER — Encounter (HOSPITAL_BASED_OUTPATIENT_CLINIC_OR_DEPARTMENT_OTHER): Payer: Self-pay | Admitting: Emergency Medicine

## 2016-07-12 DIAGNOSIS — R1013 Epigastric pain: Secondary | ICD-10-CM

## 2016-07-12 DIAGNOSIS — K921 Melena: Principal | ICD-10-CM | POA: Diagnosis present

## 2016-07-12 DIAGNOSIS — K920 Hematemesis: Secondary | ICD-10-CM

## 2016-07-12 DIAGNOSIS — B20 Human immunodeficiency virus [HIV] disease: Secondary | ICD-10-CM | POA: Diagnosis present

## 2016-07-12 DIAGNOSIS — Z79899 Other long term (current) drug therapy: Secondary | ICD-10-CM

## 2016-07-12 DIAGNOSIS — Z8711 Personal history of peptic ulcer disease: Secondary | ICD-10-CM

## 2016-07-12 DIAGNOSIS — K649 Unspecified hemorrhoids: Secondary | ICD-10-CM | POA: Diagnosis present

## 2016-07-12 DIAGNOSIS — K922 Gastrointestinal hemorrhage, unspecified: Secondary | ICD-10-CM | POA: Diagnosis present

## 2016-07-12 DIAGNOSIS — F17210 Nicotine dependence, cigarettes, uncomplicated: Secondary | ICD-10-CM | POA: Diagnosis present

## 2016-07-12 DIAGNOSIS — Z882 Allergy status to sulfonamides status: Secondary | ICD-10-CM

## 2016-07-12 DIAGNOSIS — R569 Unspecified convulsions: Secondary | ICD-10-CM

## 2016-07-12 DIAGNOSIS — Z21 Asymptomatic human immunodeficiency virus [HIV] infection status: Secondary | ICD-10-CM | POA: Diagnosis present

## 2016-07-12 DIAGNOSIS — F411 Generalized anxiety disorder: Secondary | ICD-10-CM | POA: Diagnosis present

## 2016-07-12 DIAGNOSIS — Z887 Allergy status to serum and vaccine status: Secondary | ICD-10-CM

## 2016-07-12 DIAGNOSIS — G40901 Epilepsy, unspecified, not intractable, with status epilepticus: Secondary | ICD-10-CM | POA: Diagnosis present

## 2016-07-12 DIAGNOSIS — I10 Essential (primary) hypertension: Secondary | ICD-10-CM | POA: Diagnosis present

## 2016-07-12 DIAGNOSIS — Z888 Allergy status to other drugs, medicaments and biological substances status: Secondary | ICD-10-CM

## 2016-07-12 DIAGNOSIS — K6289 Other specified diseases of anus and rectum: Secondary | ICD-10-CM | POA: Diagnosis present

## 2016-07-12 DIAGNOSIS — Z880 Allergy status to penicillin: Secondary | ICD-10-CM

## 2016-07-12 DIAGNOSIS — K625 Hemorrhage of anus and rectum: Secondary | ICD-10-CM

## 2016-07-12 LAB — OCCULT BLOOD X 1 CARD TO LAB, STOOL: Fecal Occult Bld: POSITIVE — AB

## 2016-07-12 LAB — CBC WITH DIFFERENTIAL/PLATELET
BASOS ABS: 0 10*3/uL (ref 0.0–0.1)
Basophils Relative: 0 %
Eosinophils Absolute: 0.1 10*3/uL (ref 0.0–0.7)
Eosinophils Relative: 1 %
HEMATOCRIT: 46.5 % (ref 39.0–52.0)
Hemoglobin: 16.4 g/dL (ref 13.0–17.0)
LYMPHS PCT: 25 %
Lymphs Abs: 1.6 10*3/uL (ref 0.7–4.0)
MCH: 32.6 pg (ref 26.0–34.0)
MCHC: 35.3 g/dL (ref 30.0–36.0)
MCV: 92.4 fL (ref 78.0–100.0)
MONO ABS: 0.4 10*3/uL (ref 0.1–1.0)
Monocytes Relative: 7 %
NEUTROS ABS: 4.4 10*3/uL (ref 1.7–7.7)
Neutrophils Relative %: 67 %
Platelets: 241 10*3/uL (ref 150–400)
RBC: 5.03 MIL/uL (ref 4.22–5.81)
RDW: 13 % (ref 11.5–15.5)
WBC: 6.5 10*3/uL (ref 4.0–10.5)

## 2016-07-12 LAB — COMPREHENSIVE METABOLIC PANEL
ALT: 26 U/L (ref 17–63)
AST: 23 U/L (ref 15–41)
Albumin: 5.1 g/dL — ABNORMAL HIGH (ref 3.5–5.0)
Alkaline Phosphatase: 94 U/L (ref 38–126)
Anion gap: 5 (ref 5–15)
BILIRUBIN TOTAL: 0.4 mg/dL (ref 0.3–1.2)
BUN: 8 mg/dL (ref 6–20)
CO2: 26 mmol/L (ref 22–32)
CREATININE: 0.81 mg/dL (ref 0.61–1.24)
Calcium: 9.6 mg/dL (ref 8.9–10.3)
Chloride: 106 mmol/L (ref 101–111)
GFR calc Af Amer: >60 mL/min (ref >60)
Glucose, Bld: 104 mg/dL — ABNORMAL HIGH (ref 65–99)
Potassium: 4.1 mmol/L (ref 3.5–5.1)
Sodium: 137 mmol/L (ref 135–145)
TOTAL PROTEIN: 8.2 g/dL — AB (ref 6.5–8.1)

## 2016-07-12 LAB — LIPASE, BLOOD: LIPASE: 27 U/L (ref 11–51)

## 2016-07-12 LAB — HEMOGLOBIN AND HEMATOCRIT, BLOOD
HCT: 44.6 % (ref 39.0–52.0)
HEMATOCRIT: 42.6 % (ref 39.0–52.0)
HEMOGLOBIN: 15 g/dL (ref 13.0–17.0)
Hemoglobin: 14.5 g/dL (ref 13.0–17.0)

## 2016-07-12 LAB — TYPE AND SCREEN
ABO/RH(D): O POS
Antibody Screen: NEGATIVE

## 2016-07-12 LAB — PHENYTOIN LEVEL, TOTAL: Phenytoin Lvl: <2.5 ug/mL — ABNORMAL LOW (ref 10.0–20.0)

## 2016-07-12 LAB — ABO/RH: ABO/RH(D): O POS

## 2016-07-12 MED ORDER — HYDROMORPHONE HCL 1 MG/ML IJ SOLN
1.0000 mg | Freq: Once | INTRAMUSCULAR | Status: AC
  Administered 2016-07-12: 1 mg via INTRAVENOUS
  Filled 2016-07-12: qty 1

## 2016-07-12 MED ORDER — IOPAMIDOL (ISOVUE-300) INJECTION 61%
100.0000 mL | Freq: Once | INTRAVENOUS | Status: AC | PRN
  Administered 2016-07-12: 100 mL via INTRAVENOUS

## 2016-07-12 MED ORDER — ONDANSETRON HCL 4 MG/2ML IJ SOLN
4.0000 mg | Freq: Four times a day (QID) | INTRAMUSCULAR | Status: DC | PRN
  Administered 2016-07-12: 4 mg via INTRAVENOUS
  Filled 2016-07-12 (×2): qty 2

## 2016-07-12 MED ORDER — EMTRICITABINE-TENOFOVIR AF 200-25 MG PO TABS: 1.0000 | ORAL_TABLET | Freq: Every day | ORAL | Status: DC

## 2016-07-12 MED ORDER — LEVETIRACETAM 500 MG PO TABS
500.0000 mg | ORAL_TABLET | Freq: Two times a day (BID) | ORAL | Status: DC
  Administered 2016-07-13 (×2): 500 mg via ORAL
  Filled 2016-07-12 (×2): qty 1

## 2016-07-12 MED ORDER — EMTRICITABINE-TENOFOVIR DF 200-300 MG PO TABS
1.0000 | ORAL_TABLET | Freq: Every day | ORAL | Status: DC
  Administered 2016-07-13: 1 via ORAL
  Filled 2016-07-12: qty 1

## 2016-07-12 MED ORDER — SODIUM CHLORIDE 0.9 % IV BOLUS (SEPSIS)
1000.0000 mL | Freq: Once | INTRAVENOUS | Status: AC
  Administered 2016-07-12: 1000 mL via INTRAVENOUS

## 2016-07-12 MED ORDER — PHENYTOIN SODIUM EXTENDED 100 MG PO CAPS
100.0000 mg | ORAL_CAPSULE | Freq: Three times a day (TID) | ORAL | Status: DC
  Administered 2016-07-13 (×3): 100 mg via ORAL
  Filled 2016-07-12 (×3): qty 1

## 2016-07-12 MED ORDER — ONDANSETRON HCL 4 MG/2ML IJ SOLN
4.0000 mg | Freq: Once | INTRAMUSCULAR | Status: AC
  Administered 2016-07-12: 4 mg via INTRAVENOUS
  Filled 2016-07-12: qty 2

## 2016-07-12 MED ORDER — ACETAMINOPHEN 325 MG PO TABS: 650.0000 mg | ORAL_TABLET | Freq: Four times a day (QID) | ORAL | Status: DC | PRN

## 2016-07-12 MED ORDER — FAMOTIDINE IN NACL 20-0.9 MG/50ML-% IV SOLN
20.0000 mg | Freq: Two times a day (BID) | INTRAVENOUS | Status: DC
  Administered 2016-07-12 – 2016-07-13 (×3): 20 mg via INTRAVENOUS
  Filled 2016-07-12 (×4): qty 50

## 2016-07-12 MED ORDER — LISINOPRIL 20 MG PO TABS
20.0000 mg | ORAL_TABLET | Freq: Every day | ORAL | Status: DC
  Administered 2016-07-12 – 2016-07-13 (×2): 20 mg via ORAL
  Filled 2016-07-12 (×2): qty 1

## 2016-07-12 MED ORDER — RALTEGRAVIR POTASSIUM 400 MG PO TABS
400.0000 mg | ORAL_TABLET | Freq: Two times a day (BID) | ORAL | Status: DC
  Administered 2016-07-13 (×2): 400 mg via ORAL
  Filled 2016-07-12 (×3): qty 1

## 2016-07-12 MED ORDER — TRAZODONE HCL 50 MG PO TABS: 25.0000 mg | ORAL_TABLET | Freq: Every evening | ORAL | Status: DC | PRN

## 2016-07-12 MED ORDER — SODIUM CHLORIDE 0.9 % IV SOLN
INTRAVENOUS | Status: DC
  Administered 2016-07-12 (×2): via INTRAVENOUS

## 2016-07-12 MED ORDER — CLONAZEPAM 1 MG PO TABS
1.0000 mg | ORAL_TABLET | Freq: Two times a day (BID) | ORAL | Status: DC | PRN
  Administered 2016-07-13 (×2): 1 mg via ORAL
  Filled 2016-07-12 (×2): qty 1

## 2016-07-12 MED ORDER — ONDANSETRON HCL 4 MG PO TABS: 4.0000 mg | ORAL_TABLET | Freq: Four times a day (QID) | ORAL | Status: DC | PRN

## 2016-07-12 MED ORDER — ONDANSETRON HCL 4 MG/2ML IJ SOLN
4.0000 mg | Freq: Once | INTRAMUSCULAR | Status: AC
Start: 2016-07-12 — End: 2016-07-12
  Administered 2016-07-12: 4 mg via INTRAVENOUS
  Filled 2016-07-12: qty 2

## 2016-07-12 MED ORDER — HYDROMORPHONE HCL 1 MG/ML IJ SOLN
1.0000 mg | INTRAMUSCULAR | Status: DC | PRN
  Administered 2016-07-12 – 2016-07-13 (×5): 1 mg via INTRAVENOUS
  Filled 2016-07-12 (×5): qty 1

## 2016-07-12 MED ORDER — PROMETHAZINE HCL 25 MG/ML IJ SOLN
12.5000 mg | Freq: Once | INTRAMUSCULAR | Status: AC
  Administered 2016-07-12: 12.5 mg via INTRAVENOUS
  Filled 2016-07-12: qty 1

## 2016-07-12 MED ORDER — LORAZEPAM 2 MG/ML IJ SOLN
1.0000 mg | Freq: Once | INTRAMUSCULAR | Status: AC
  Administered 2016-07-12: 1 mg via INTRAVENOUS
  Filled 2016-07-12: qty 1

## 2016-07-12 MED ORDER — ACETAMINOPHEN 650 MG RE SUPP: 650.0000 mg | Freq: Four times a day (QID) | RECTAL | Status: DC | PRN

## 2016-07-12 MED ORDER — LORAZEPAM 2 MG/ML IJ SOLN
2.0000 mg | INTRAMUSCULAR | Status: DC | PRN
  Administered 2016-07-13: 2 mg via INTRAVENOUS
  Filled 2016-07-12 (×4): qty 1

## 2016-07-12 NOTE — Progress Notes (Signed)
35yo M with PMHx significant for HIV, seizures, migraines, panic attacks, hypertension who presents with GI bleed. Had melena the days prior to coming to the emergency room. Then had BRBPR the day he presented. Has been seen for GI bleed earlier in the year but no f/u. No hx of scopes. No abuse of NSAIDs. Maroon stool on exam. No anal trauma on exam.   ED course: Normotensive blood pressures. Positive guaiac stool. That will level pending. Hemoglobin 16.4. No bloody stools of foreign emergency room. CT abdomen and pelvis show findings consistent with chronic constipation.  Haydee SalterPhillip M Hobbs MD

## 2016-07-12 NOTE — ED Notes (Signed)
Dr Zackowski in room with patient now. 

## 2016-07-12 NOTE — ED Triage Notes (Addendum)
Pt c/o nausea, vomiting with blood in stool and emesis since yesterday with burning abdominal pain that started last night/early this morning.  Pt denies any known injury.  Pt reports he thought he had diarrhea around 0400 this morning but when he went to the bathroom "it was just blood."

## 2016-07-12 NOTE — ED Provider Notes (Addendum)
[facility] Provider Note   CSN: [id number] Arrival date & time: 07/12/16  4098     History   Chief Complaint Chief Complaint  Patient presents with  . Abdominal Pain  . Nausea  . Rectal Bleeding    HPI Javier Hill is a 35 y.o. male.  Patient with complaint of GI bleed. Patient with nausea some vomiting no diarrhea for episodes of red blood per rectum. Epigastric burning pain starting yesterday nausea and vomiting started yesterday. Patient states stool was black in color prior to yesterday. Patient followed by [facility] HIV positive is on antivirals. Abdominal pain is constant epigastric area some radiation throughout the abdomen. Pain 8 out of 10.      Past Medical History:  Diagnosis Date  . Anxiety epi  . Epilepsy (HCC)   . HIV (human immunodeficiency virus infection) (HCC)   . Hypertension   . Migraines 2016  . Panic attack   . Seizure [facility])    last seizure 09/05/2014    Patient Active Problem List   Diagnosis Date Noted  . 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 Status epilepticus (HCC) 02/26/2015  . Herpes genitalis in men 10/23/2014  . Anxiety disorder due to general medical condition with panic attack 08/02/2014  . Essential hypertension 08/02/2014  . Esophageal reflux 08/02/2014  . Right shoulder pain 08/02/2014  . Seizure (HCC) 07/17/2014  . Acute respiratory failure with hypoxia (HCC) 07/17/2014  . HIV (human immunodeficiency virus infection) (HCC) 07/17/2014    Past Surgical History:  Procedure Laterality Date  . ORIF CLAVICULAR FRACTURE         Home Medications    Prior to Admission medications   Medication Sig Start Date End Date Taking? Authorizing Provider  levETIRAcetam (KEPPRA) 500 MG tablet Take 1 tablet (500 mg total) by mouth 2 (two) times daily. 06/08/16  Yes Henrietta Hoover, NP  lisinopril (PRINIVIL,ZESTRIL) 20 MG tablet Take 1 tablet (20 mg total) by mouth daily. 05/07/16  Yes Henrietta Hoover,  NP  omeprazole (PRILOSEC) 20 MG capsule Take 20 mg by mouth 2 (two) times daily before a meal.   Yes Historical Provider, MD  phenytoin (DILANTIN) 100 MG ER capsule Take 1 capsule (100 mg total) by mouth 3 (three) times daily. 02/14/16  Yes Massie Maroon, FNP  raltegravir (ISENTRESS) 400 MG tablet Take 1 tablet (400 mg total) by mouth 2 (two) times daily. 12/23/15  Yes Ginnie Smart, MD  raltegravir (ISENTRESS) 400 MG tablet TAKE 1 TABLET(400 MG) BY MOUTH TWICE DAILY 03/25/16  Yes Ginnie Smart, MD  TRUVADA 200-300 MG tablet TAKE 1 TABLET BY MOUTH EVERY DAY 04/24/16  Yes Ginnie Smart, MD  acetaminophen (TYLENOL) 500 MG tablet Take 1 tablet (500 mg total) by mouth every 6 (six) hours as needed. 08/18/15   Mady Gemma, PA-C  anti-nausea (EMETROL) solution Take 10 mLs by mouth every 15 (fifteen) minutes as needed for nausea or vomiting.    Historical Provider, MD  clonazePAM (KLONOPIN) 1 MG tablet Take 1 tablet (1 mg total) by mouth 2 (two) times daily as needed for anxiety. Patient not taking: Reported on 03/13/2016 01/05/15   Rolan Bucco, MD  diclofenac sodium (VOLTAREN) 1 % GEL Apply 1 application topically 4 (four) times daily. Patient not taking: Reported on 08/18/2015 07/23/15   Hayden Rasmussen, NP  HYDROcodone-acetaminophen (NORCO) 5-325 MG tablet Take 1 tablet by mouth every 6 (six) hours as needed for severe pain. Patient not taking: Reported  on 03/13/2016 03/06/16   Mercedes Camprubi-Soms, PA-C  HYDROcodone-acetaminophen (NORCO/VICODIN) 5-325 MG tablet Take 1-2 tablets by mouth every 6 (six) hours as needed for moderate pain or severe pain. Reported on 03/13/2016    Historical Provider, MD  methocarbamol (ROBAXIN) 500 MG tablet Take 1 tablet (500 mg total) by mouth 2 (two) times daily. Patient not taking: Reported on 03/13/2016 08/18/15   Mady Gemma, PA-C  naproxen (NAPROSYN) 250 MG tablet Take 1 tablet (250 mg total) by mouth 2 (two) times daily as needed for mild pain,  moderate pain or headache (TAKE WITH MEALS.). 05/07/16   Henrietta Hoover, NP  nystatin (MYCOSTATIN) 100000 UNIT/ML suspension Take 5 mLs (500,000 Units total) by mouth 4 (four) times daily. Patient not taking: Reported on 03/13/2016 08/18/15   Mady Gemma, PA-C    Family History Family History  Problem Relation Age of Onset  . Adopted: Yes    Social History Social History  Substance Use Topics  . Smoking status: Current Every Day Smoker    Packs/day: 0.10    Types: Cigarettes  . Smokeless tobacco: Never Used     Comment: cutting back 2 CIGGERETS A DAY   . Alcohol use No     Comment: occasional      Allergies   Caffeine; Ciprofloxacin; Penicillins; Sulfamethoxazole-trimethoprim; Tramadol; Acyclovir and related; Haldol [haloperidol lactate]; Librium [chlordiazepoxide]; Aloprim [allopurinol]; Ambien [zolpidem tartrate]; Bactrim [sulfamethoxazole-trimethoprim]; Other; Pneumovax [pneumococcal polysaccharide vaccine]; Prevnar [pneumococcal 13-val conj vacc]; Watermelon flavor; and Zoloft [sertraline hcl]   Review of Systems Review of Systems  Constitutional: Negative for fever.  HENT: Negative for congestion.   Eyes: Negative.   Respiratory: Negative for shortness of breath.   Cardiovascular: Negative for chest pain.  Gastrointestinal: Positive for anal bleeding, blood in stool, nausea and vomiting. Negative for diarrhea.  Genitourinary: Negative for dysuria.  Musculoskeletal: Negative for back pain.  Skin: Negative for rash.  Allergic/Immunologic: Positive for immunocompromised state.  Neurological: Negative for headaches.  Hematological: Does not bruise/bleed easily.  Psychiatric/Behavioral: The patient is nervous/anxious.      Physical Exam Updated Vital Signs BP 137/87 (BP Location: Right Arm)   Pulse 82   Temp 98.2 F (36.8 C) (Oral)   Resp 18   Ht 5\' 5"  (1.651 m)   Wt 63.5 kg   SpO2 100%   BMI 23.30 kg/m   Physical Exam  Constitutional: He is  oriented to person, place, and time. He appears well-developed and well-nourished. He appears distressed.  HENT:  Head: Normocephalic and atraumatic.  Mouth/Throat: Oropharynx is clear and moist.  Eyes: EOM are normal. Pupils are equal, round, and reactive to light.  Neck: Normal range of motion. Neck supple.  Cardiovascular: Normal rate, regular rhythm and normal heart sounds.   Pulmonary/Chest: Effort normal and breath sounds normal.  Abdominal: Soft. Bowel sounds are normal.  Genitourinary: Rectal exam shows guaiac positive stool.  Genitourinary Comments: No external hemorrhoids or prolapsed internal hemorrhoids no anal fissure. Rectal exam without any mass stool maroon in color. No gross blood.  Musculoskeletal: Normal range of motion.  Neurological: He is alert and oriented to person, place, and time. No cranial nerve deficit. He exhibits normal muscle tone. Coordination normal.  Skin: Skin is warm.     ED Treatments / Results  Labs (all labs ordered are listed, but only abnormal results are displayed) Labs Reviewed  CBC WITH DIFFERENTIAL/PLATELET  COMPREHENSIVE METABOLIC PANEL  LIPASE, BLOOD    EKG  EKG Interpretation None  Radiology No results found.  Procedures Procedures (including critical care time)  Medications Ordered in ED Medications  0.9 %  sodium chloride infusion ( Intravenous New Bag/Given 07/12/16 0909)  famotidine (PEPCID) IVPB 20 mg premix (20 mg Intravenous New Bag/Given 07/12/16 0919)  LORazepam (ATIVAN) injection 1 mg (not administered)  sodium chloride 0.9 % bolus 1,000 mL (1,000 mLs Intravenous New Bag/Given 07/12/16 0909)  ondansetron (ZOFRAN) injection 4 mg (4 mg Intravenous Given 07/12/16 0919)  HYDROmorphone (DILAUDID) injection 1 mg (1 mg Intravenous Given 07/12/16 0919)     Initial Impression / Assessment and Plan / ED Course  I have reviewed the triage vital signs and the nursing notes.  Pertinent labs & imaging results that  were available during my care of the patient were reviewed by me and considered in my medical decision making (see chart for details).  Clinical Course   CT scan of abdomen pelvis is pending. Heme positive stool but more maroon in color. Abdomen flat soft no significant tenderness. Based on patient's history of 4 bloody bowel movements today will require admission and observation. Patient started on Pepcid for the epigastric discomfort patient also given hydromorphone and Zofran. Following hydromorphone patient had an anxiety attack started on Ativan. Vital signs without any significant tachycardia or hypotension.   Final Clinical Impressions(s) / ED Diagnoses   Final diagnoses:  Gastrointestinal hemorrhage, unspecified gastrointestinal hemorrhage type  Epigastric pain    New Prescriptions New Prescriptions   No medications on file     Vanetta MuldersScott Jacquelene Kopecky, MD 07/12/16 [id number]    Vanetta MuldersScott Jerami Tammen, MD 07/12/16 [id number]  CT scan of the abdomen and pelvis without any significant abnormalities. Discussed with hospitalist admission for observation overnight follow hemoglobin and hematocrit.    Vanetta MuldersScott Julyan Gales, MD 07/12/16 1130

## 2016-07-12 NOTE — ED Notes (Signed)
[facility] en route, eta approx 20 minute, report given to ems personnel.

## 2016-07-12 NOTE — Progress Notes (Signed)
Javier KoyanagiJose Hill [id number] Admission Data: 07/12/2016 5:47 PM Attending Provider: Haydee SalterPhillip M Hobbs, MD  PCP:No PCP Per Patient Consults/ Treatment Team:   Javier KoyanagiJose Hill is a 35 y.o. male patient admitted from ED awake, alert  & orientated  X 3,  Full Code, VSS - Blood pressure (!) 148/73, pulse (!) 57, temperature 98 F (36.7 C), temperature source Oral, resp. rate 17, height 5\' 5"  (1.651 m), weight 61.8 kg (136 lb 4.8 oz), SpO2 98 %.,  no c/o shortness of breath, no c/o chest pain, no distress noted. Tele # 2 placed and pt is currently running:normal sinus rhythm.   IV site WDL:  antecubital right, condition patent and no redness with a transparent dsg that's clean dry and intact.  Allergies:     Past Medical History:  Diagnosis Date  . Anxiety epi  . Epilepsy (HCC)   . HIV (human immunodeficiency virus infection) (HCC)   . Hypertension   . Migraines 2016  . Panic attack   . Seizure (HCC)    last seizure 09/05/2014    History:  obtained from chart review. Tobacco/alcohol: denied none  Pt orientation to unit, room and routine. Information packet given to patient/family and safety video watched.  Admission INP armband ID verified with patient/family, and in place. SR up x 2, fall risk assessment complete with Patient and family verbalizing understanding of risks associated with falls. Pt verbalizes an understanding of how to use the call bell and to call for help before getting out of bed.  Skin, clean-dry- intact without evidence of bruising, or skin tears.   No evidence of skin break down noted on exam. color normal, vascularity normal, no rashes or suspicious lesions, no evidence of bleeding or bruising, no lesions noted, no rash, no edema, temperature normal, texture normal, mobility and turgor normal    Will cont to monitor and assist as needed.  Camillo FlamingVicki L Shadee Rathod, RN 07/12/2016 5:47 PM

## 2016-07-12 NOTE — ED Notes (Signed)
Called to give report to unit RN.  RN Chip BoerVicki unavailable at present and will call back.

## 2016-07-12 NOTE — H&P (Addendum)
[facility] History and Physical  Javier Hill NWG:[id number] DOB: [birth year] DOA: 07/12/2016  Referring physician: PCP: No PCP Per Patient   Chief Complaint: "I had blood from my butt."  HPI: Javier Hill is a 35 y.o. male with past mental history of anxiety, seizures, HIV, hypertension, migraines who presented with a chief complaint of rectal bleeding. Patient also has a history of stomach ulcer. Patient denies abuse of NSAIDs or salicylates. Patient denies any recent anal trauma and intercourse. Patient states symptoms started all of a sudden. Woke up with abdominal pain began to have black stools. Then had multiple bloody red bowel movements. Patient has no history of blood transfusion. Denies lightheadedness, dizziness, presyncope. History anemia.  ED course: Patient given Pepcid. Hemoglobin checked 16. Vital signs stable emergency room started on IV fluid. He physician called over to [facility] for transfer.  Review of Systems:  As per HPI otherwise 10 point review of systems negative.    Past Medical History:  Diagnosis Date  . Anxiety epi  . Epilepsy (HCC)   . HIV (human immunodeficiency virus infection) (HCC)   . Hypertension   . Migraines 2016  . Panic attack   . Seizure [facility])    last seizure 09/05/2014   Past Surgical History:  Procedure Laterality Date  . ORIF CLAVICULAR FRACTURE     Social History:  reports that he has been smoking Cigarettes.  He has been smoking about 0.10 packs per day. He has never used smokeless tobacco. He reports that he uses drugs, including Marijuana. He reports that he does not drink alcohol.  Allergies  Allergen Reactions  . Caffeine Other (See Comments)    Other reaction(s): Seizures  . Ciprofloxacin Shortness Of Breath  . Penicillins Anaphylaxis    Has patient had a PCN reaction causing immediate rash, facial/tongue/throat swelling, SOB or lightheadedness with hypotension: rush, yellow eyes  Has patient had  a PCN reaction that required hospitalization-yes, ed visit Has patient had a PCN reaction occurring within the last 10 years: yes If all of the above answers are "NO", then may proceed with Cephalosporin use.   . Sulfamethoxazole-Trimethoprim Anaphylaxis  . Tramadol Other (See Comments)    Seizures   . Acyclovir And Related Hives and Rash  . Haldol [Haloperidol Lactate] Other (See Comments)    Muscle spasms  . Librium [Chlordiazepoxide] Itching  . Aloprim [Allopurinol] Other (See Comments)    unknown  . Ambien [Zolpidem Tartrate] Hives, Anxiety and Other (See Comments)    Insomnia   . Bactrim [Sulfamethoxazole-Trimethoprim] Other (See Comments)  . Other Other (See Comments)    Other reaction(s): Seizures "Nabenzata" Pt is unsure of what type of antibiotic  . Pneumovax [Pneumococcal Polysaccharide Vaccine] Itching and Rash  . Prevnar [Pneumococcal 13-Val Conj Vacc] Itching and Rash  . Watermelon Flavor Rash  . Zoloft [Sertraline Hcl] Other (See Comments)    unknown    Family History  Problem Relation Age of Onset  . Adopted: Yes     Prior to Admission medications   Medication Sig Start Date End Date Taking? Authorizing Provider  levETIRAcetam (KEPPRA) 500 MG tablet Take 1 tablet (500 mg total) by mouth 2 (two) times daily. 06/08/16  Yes Henrietta Hoover, NP  lisinopril (PRINIVIL,ZESTRIL) 20 MG tablet Take 1 tablet (20 mg total) by mouth daily. 05/07/16  Yes Henrietta Hoover, NP  omeprazole (PRILOSEC) 20 MG capsule Take 20 mg by mouth 2 (two) times daily before a meal.   Yes Historical Provider, MD  phenytoin (DILANTIN) 100 MG ER capsule Take 1 capsule (100 mg total) by mouth 3 (three) times daily. 02/14/16  Yes Massie MaroonLachina M Hollis, FNP  raltegravir (ISENTRESS) 400 MG tablet Take 1 tablet (400 mg total) by mouth 2 (two) times daily. 12/23/15  Yes Ginnie SmartJeffrey C Hatcher, MD  raltegravir (ISENTRESS) 400 MG tablet TAKE 1 TABLET(400 MG) BY MOUTH TWICE DAILY 03/25/16  Yes Ginnie SmartJeffrey C Hatcher, MD    TRUVADA 200-300 MG tablet TAKE 1 TABLET BY MOUTH EVERY DAY 04/24/16  Yes Ginnie SmartJeffrey C Hatcher, MD  acetaminophen (TYLENOL) 500 MG tablet Take 1 tablet (500 mg total) by mouth every 6 (six) hours as needed. 08/18/15   Mady GemmaElizabeth C Westfall, PA-C  anti-nausea (EMETROL) solution Take 10 mLs by mouth every 15 (fifteen) minutes as needed for nausea or vomiting.    Historical Provider, MD  clonazePAM (KLONOPIN) 1 MG tablet Take 1 tablet (1 mg total) by mouth 2 (two) times daily as needed for anxiety. Patient not taking: Reported on 03/13/2016 01/05/15   Rolan BuccoMelanie Belfi, MD  diclofenac sodium (VOLTAREN) 1 % GEL Apply 1 application topically 4 (four) times daily. Patient not taking: Reported on 08/18/2015 07/23/15   Hayden Rasmussenavid Mabe, NP  HYDROcodone-acetaminophen (NORCO) 5-325 MG tablet Take 1 tablet by mouth every 6 (six) hours as needed for severe pain. Patient not taking: Reported on 03/13/2016 03/06/16   Mercedes Camprubi-Soms, PA-C  HYDROcodone-acetaminophen (NORCO/VICODIN) 5-325 MG tablet Take 1-2 tablets by mouth every 6 (six) hours as needed for moderate pain or severe pain. Reported on 03/13/2016    Historical Provider, MD  methocarbamol (ROBAXIN) 500 MG tablet Take 1 tablet (500 mg total) by mouth 2 (two) times daily. Patient not taking: Reported on 03/13/2016 08/18/15   Mady GemmaElizabeth C Westfall, PA-C  naproxen (NAPROSYN) 250 MG tablet Take 1 tablet (250 mg total) by mouth 2 (two) times daily as needed for mild pain, moderate pain or headache (TAKE WITH MEALS.). 05/07/16   Henrietta HooverLinda C Bernhardt, NP  nystatin (MYCOSTATIN) 100000 UNIT/ML suspension Take 5 mLs (500,000 Units total) by mouth 4 (four) times daily. Patient not taking: Reported on 03/13/2016 08/18/15   Mady GemmaElizabeth C Westfall, [place]PA-C   Physical Exam: Vitals:   07/12/16 1300 07/12/16 1326 07/12/16 1425 07/12/16 1527  BP:  125/89 125/88 (!) 148/73  Pulse: 67 70 74 (!) 57  Resp: 21 14 15 17   Temp:   98 F (36.7 C) 98 F (36.7 C)  TempSrc:    Oral  SpO2: 97% 99% 99%  98%  Weight:      Height:        Wt Readings from Last 3 Encounters:  07/12/16 63.5 kg (140 lb)  05/07/16 63.5 kg (140 lb)  03/13/16 58.5 kg (129 lb)    General:  Appears calm and comfortable Eyes:  PERRL, EOMI, normal lids, iris ENT:  grossly normal hearing, lips & tongue Neck:  no LAD, masses or thyromegaly Cardiovascular:  RRR, no m/r/g. No LE edema.  Respiratory:  CTA bilaterally, no w/r/r. Normal respiratory effort. Abdomen:  soft, ND, generalized tenderness; rectal exam deferred (exam normal except for maroon stool per ED provider) Skin:  no rash or induration seen on limited exam Musculoskeletal:  grossly normal tone BUE/BLE Psychiatric:  grossly normal mood and affect, speech fluent and appropriate Neurologic:  CN 2-12 grossly intact, moves all extremities in coordinated fashion.          Labs on Admission:  Basic Metabolic Panel:  Recent Labs Lab 07/12/16 0910  NA 137  K  4.1  CL 106  CO2 26  GLUCOSE 104*  BUN 8  CREATININE 0.81  CALCIUM 9.6   Liver Function Tests:  Recent Labs Lab 07/12/16 0910  AST 23  ALT 26  ALKPHOS 94  BILITOT 0.4  PROT 8.2*  ALBUMIN 5.1*    Recent Labs Lab 07/12/16 0910  LIPASE 27   No results for input(s): AMMONIA in the last 168 hours. CBC:  Recent Labs Lab 07/12/16 0910  WBC 6.5  NEUTROABS 4.4  HGB 16.4  HCT 46.5  MCV 92.4  PLT 241   Cardiac Enzymes: No results for input(s): CKTOTAL, CKMB, CKMBINDEX, TROPONINI in the last 168 hours.  BNP (last 3 results) No results for input(s): BNP in the last 8760 hours.  ProBNP (last 3 results) No results for input(s): PROBNP in the last 8760 hours.   Serum creatinine: 0.81 mg/dL 09/81/19 1478 Estimated creatinine clearance: 111.8 mL/min  CBG: No results for input(s): GLUCAP in the last 168 hours.  Radiological Exams on Admission: Ct Abdomen Pelvis W Contrast  Result Date: 07/12/2016 CLINICAL DATA:  35 year old male with a history of blood in his stool. No  history of hemorrhoids. EXAM: CT ABDOMEN AND PELVIS WITH CONTRAST TECHNIQUE: Multidetector CT imaging of the abdomen and pelvis was performed using the standard protocol following bolus administration of intravenous contrast. CONTRAST:  ISOVUE-300 IOPAMIDOL (ISOVUE-300) INJECTION 61% COMPARISON:  01/27/2016 FINDINGS: Lower chest: No acute abnormality. Hepatobiliary: No focal liver abnormality is seen. No gallstones, gallbladder wall thickening, or biliary dilatation. Pancreas: Unremarkable. No pancreatic ductal dilatation or surrounding inflammatory changes. Spleen: Normal in size without focal abnormality. Adrenals/Urinary Tract: Adrenal glands are unremarkable. Kidneys are normal, without renal calculi, focal lesion, or hydronephrosis. Bladder is unremarkable. Stomach/Bowel: Unremarkable appearance of stomach. Decompressed small bowel, with no obstruction or transition point. Normal appendix. Partial fecalization of distal ileum. No inflammatory changes. No mesenteric edema or lymph nodes. Colonic diverticula are present. No associated inflammatory changes. No significant stool burden. Vascular/Lymphatic: Early atherosclerosis with calcifications of the right iliac system. No aneurysm or dissection. No periaortic fluid. Reproductive: Prostate is unremarkable. Other: No abdominal wall hernia or abnormality. No abdominopelvic ascites. Musculoskeletal: No acute or significant osseous findings. IMPRESSION: No acute finding to account for the given symptoms. Partial fecalization of distal small bowel just above the ileocecal valve, potentially representing chronic constipation. Signed, Yvone Neu. Loreta Ave, DO [facility] Radiology Electronically Signed   By: Gilmer Mor D.O.   On: 07/12/2016 10:43    EKG: n/a  Assessment/Plan Principal Problem:   GI bleed Active Problems:   Seizure (HCC)   HIV (human immunodeficiency virus infection) (HCC)   Essential  hypertension   Anxiety state  GI bleed Hx of GI ulcer Pepcid BID IV IVF Serial H/H Consider GI consult in AM Type and screen Avoid NSAIDs  Hypertension Cont Lisinopril  Seizures Cont keppra and dilantin Prn lorazepam for seizure Seizure precautions  Anxiety Klonopin twice a day when necessary  HIV Continue Insentress & Truvavda (home) Absolute CD4 count 1050  Code Status: Full  DVT Prophylaxis: SCDs Family Communication: pt to notify his partner Disposition Plan: Pending Improvement    Haydee Salter, MD Family [facility] [email or web address] Password [email or web address]

## 2016-07-12 NOTE — ED Notes (Addendum)
Pt ambulated to restroom.  Pt reports having a "very bloody bowel movement" with little to no stool content.   Pt reports dizziness and nausea upon returning from restroom.  Pt shivering, gave pt warm blankets for comfort.  Md advised of pt symptoms.

## 2016-07-13 ENCOUNTER — Observation Stay (HOSPITAL_COMMUNITY): Payer: Self-pay

## 2016-07-13 DIAGNOSIS — K920 Hematemesis: Secondary | ICD-10-CM

## 2016-07-13 DIAGNOSIS — K922 Gastrointestinal hemorrhage, unspecified: Secondary | ICD-10-CM

## 2016-07-13 DIAGNOSIS — I10 Essential (primary) hypertension: Secondary | ICD-10-CM

## 2016-07-13 DIAGNOSIS — R569 Unspecified convulsions: Secondary | ICD-10-CM

## 2016-07-13 DIAGNOSIS — R1013 Epigastric pain: Secondary | ICD-10-CM

## 2016-07-13 DIAGNOSIS — B20 Human immunodeficiency virus [HIV] disease: Secondary | ICD-10-CM

## 2016-07-13 LAB — BASIC METABOLIC PANEL
Anion gap: 5 (ref 5–15)
CO2: 28 mmol/L (ref 22–32)
CREATININE: 0.91 mg/dL (ref 0.61–1.24)
Calcium: 9.3 mg/dL (ref 8.9–10.3)
Chloride: 108 mmol/L (ref 101–111)
GFR calc Af Amer: >60 mL/min (ref >60)
Glucose, Bld: 99 mg/dL (ref 65–99)
POTASSIUM: 4.5 mmol/L (ref 3.5–5.1)
SODIUM: 141 mmol/L (ref 135–145)

## 2016-07-13 LAB — CBC
HCT: 45.2 % (ref 39.0–52.0)
Hemoglobin: 15.2 g/dL (ref 13.0–17.0)
MCH: 31.8 pg (ref 26.0–34.0)
MCHC: 33.6 g/dL (ref 30.0–36.0)
MCV: 94.6 fL (ref 78.0–100.0)
PLATELETS: 233 10*3/uL (ref 150–400)
RBC: 4.78 MIL/uL (ref 4.22–5.81)
RDW: 13.3 % (ref 11.5–15.5)
WBC: 7.6 10*3/uL (ref 4.0–10.5)

## 2016-07-13 LAB — PHENYTOIN LEVEL, TOTAL: Phenytoin Lvl: <2.5 ug/mL — ABNORMAL LOW (ref 10.0–20.0)

## 2016-07-13 MED ORDER — SUCRALFATE 1 G PO TABS
1.0000 g | ORAL_TABLET | Freq: Three times a day (TID) | ORAL | Status: DC
  Administered 2016-07-13: 1 g via ORAL
  Filled 2016-07-13: qty 1

## 2016-07-13 MED ORDER — HYDROCORTISONE ACETATE 25 MG RE SUPP
25.0000 mg | Freq: Two times a day (BID) | RECTAL | Status: DC
Start: 2016-07-13 — End: 2016-07-14
  Administered 2016-07-13: 25 mg via RECTAL
  Filled 2016-07-13: qty 1

## 2016-07-13 MED ORDER — PANTOPRAZOLE SODIUM 40 MG PO TBEC
40.0000 mg | DELAYED_RELEASE_TABLET | Freq: Every day | ORAL | Status: DC
  Administered 2016-07-13: 40 mg via ORAL
  Filled 2016-07-13: qty 2

## 2016-07-13 MED ORDER — SODIUM CHLORIDE 0.9 % IV SOLN
500.0000 mg | INTRAVENOUS | Status: AC
  Administered 2016-07-13: 500 mg via INTRAVENOUS
  Filled 2016-07-13: qty 10

## 2016-07-13 MED ORDER — DIPHENHYDRAMINE HCL 25 MG PO CAPS
50.0000 mg | ORAL_CAPSULE | Freq: Once | ORAL | Status: AC
  Administered 2016-07-13: 50 mg via ORAL
  Filled 2016-07-13: qty 2

## 2016-07-13 NOTE — Progress Notes (Signed)
Pt was itching , MD notified, Benadryl ordered and given.Pt doing well. Will continue to monitor.

## 2016-07-13 NOTE — Progress Notes (Signed)
RN, Herbert SetaHeather, paged because pt was demanding to leave AMA. By the time NP spoke to RN, pt had already signed papers and removed IV himself. NP asked to speak to pt, but he refused. RN stated she asked him to seek treatment somewhere else for his GIB.  KJKG, NP [facility]

## 2016-07-13 NOTE — Progress Notes (Signed)
Patient reported that he was c/o anxiety and SOB and that aura with his seizure was about to happen. Patient with twitching and flexion of extrmeities . Ativan given MD notified.

## 2016-07-13 NOTE — Progress Notes (Signed)
Pt demanding to leave AMA. Pulling IV out and arguing with nursing staff. Attempted to notify MD but pt would not wait for MD to speak with him. Second shift at bedside as well. Removing IV and having pt sign AMA papers. Reporting off to next nurse as well.  Jillyn HiddenStone,Fayelynn Distel R, RN

## 2016-07-13 NOTE — Progress Notes (Addendum)
PROGRESS NOTE                                                                                                                                                                                                             Patient Demographics:    Javier Hill, is a 35 y.o. male, DOB - 12-Sep-1981, ZOX:[id number]  Admit date - 07/12/2016   Admitting Physician Haydee Salter, MD  Outpatient Primary MD for the patient is No PCP Per Patient  LOS - 0  Outpatient Specialists: Dr Ninetta Lights  Chief Complaint  Patient presents with  . Abdominal Pain  . Nausea  . Rectal Bleeding       Brief Narrative   35 year old male with history of HIV on HAART (last CD4 >1000), seizure disorder, reported history of peptic acid disease and underwent EGD in [place] 3 years back presented to the ED with persistent painful bright red blood per rectum for the past 3 days. He also reports an episode of vomiting with streaky hematemesis. In the ED vitals in H&H stable. Hemoccult-positive. Patient reports severe epigastric pain as well. Observation under hospitalist service.   Subjective:   Patient still having bright red blood per rectum and complains of perirectal pain and epigastric discomfort. No further vomiting.   Assessment  & Plan :    Principal Problem: Rectal bleed Painful hemorrhoids versus? Proctitis. Also reports history of peptic ulcer disease. Recent use of naproxen for back pain as outpt. smokes 2-3 cigarettes a day. Added PPI, continue Pepcid. Added Carafate.  H&H stable. Ordered Anusol suppository. GI consulted.  Active Problems:   Seizures weith status epilepticus (HCC) On Dilantin with persistently subtherapeutic levels. Also on keppra. Reports being compliant with his medication. Not sure if interaction with ART is reducing plasma concentration of Dilantin. (I could not find any of his ART to decrease level of Dilantin).   Had an episode of tonic clonic seizure intermittently lasting 5 minutes per nurse. Given IV ativan. Pt alert and oriented. Reports at least 2 seizure episodes a month. Hold dilaudid. Neurology consulted.      HIV (human immunodeficiency virus infection) (HCC) Continue ART. Compliant with medication. Last CD4 >1000)    Essential hypertension Stable    Tobacco use  counseled on cessation      Code Status : Full code  Family Communication  :  None at bedside  Disposition Plan  : Home pending GI workup  Barriers For Discharge : Active symptoms  Consults  :  [facility]  Procedures  : none  DVT Prophylaxis  :  SCD  Lab Results  Component Value Date   PLT 233 07/13/2016    Antibiotics  :   Anti-infectives    Start     Dose/Rate Route Frequency Ordered Stop   07/13/16 1000  emtricitabine-tenofovir AF (DESCOVY) 200-25 MG per tablet 1 tablet  Status:  Discontinued     1 tablet Oral Daily 07/12/16 1732 07/12/16 1757   07/13/16 1000  emtricitabine-tenofovir (TRUVADA) 200-300 MG per tablet 1 tablet     1 tablet Oral Daily 07/12/16 1758     07/12/16 2200  raltegravir (ISENTRESS) tablet 400 mg    Comments:  TAKE 1 TABLET(400 MG) BY MOUTH TWICE DAILY     400 mg Oral 2 times daily 07/12/16 1727          Objective:   Vitals:   07/12/16 1326 07/12/16 1425 07/12/16 1527 07/13/16 0545  BP: 125/89 125/88 (!) 148/73 111/80  Pulse: 70 74 (!) 57 (!) 55  Resp: 14 15 17 20   Temp:  98 F (36.7 C) 98 F (36.7 C) 97.4 F (36.3 C)  TempSrc:   Oral Oral  SpO2: 99% 99% 98% 100%  Weight:   61.8 kg (136 lb 4.8 oz)   Height:   5\' 5"  (1.651 m)     Wt Readings from Last 3 Encounters:  07/12/16 61.8 kg (136 lb 4.8 oz)  05/07/16 63.5 kg (140 lb)  03/13/16 58.5 kg (129 lb)     Intake/Output Summary (Last 24 hours) at 07/13/16 1145 Last data filed at 07/13/16 0655  Gross per 24 hour  Intake          2226.67 ml  Output                0 ml  Net          2226.67 ml      Physical Exam  Gen: not in distress HEENT: no pallor, moist mucosa, supple neck Chest: clear b/l, no added sounds CVS: N S1&S2, no murmurs, rubs or gallop GI: soft, No epigastric tenderness, nondistended, bowel sounds present, rectal exam with palpable external hemorrhoids, no blood Musculoskeletal: warm, no edema CNS; fatigued, alert and oreinted , non focal      Data Review:    CBC  Recent Labs Lab 07/12/16 0910 07/12/16 1818 07/12/16 2300 07/13/16 0543  WBC 6.5  --   --  7.6  HGB 16.4 14.5 15.0 15.2  HCT 46.5 42.6 44.6 45.2  PLT 241  --   --  233  MCV 92.4  --   --  94.6  MCH 32.6  --   --  31.8  MCHC 35.3  --   --  33.6  RDW 13.0  --   --  13.3  LYMPHSABS 1.6  --   --   --   MONOABS 0.4  --   --   --   EOSABS 0.1  --   --   --   BASOSABS 0.0  --   --   --     Chemistries   Recent Labs Lab 07/12/16 0910 07/13/16 0543  NA 137 141  K 4.1 4.5  CL 106 108  CO2 26 28  GLUCOSE 104* 99  BUN 8 <5*  CREATININE 0.81 0.91  CALCIUM 9.6 9.3  AST 23  --   ALT 26  --   ALKPHOS 94  --   BILITOT 0.4  --    ------------------------------------------------------------------------------------------------------------------ No results for input(s): CHOL, HDL, LDLCALC, TRIG, CHOLHDL, LDLDIRECT in the last 72 hours.  No results found for: HGBA1C ------------------------------------------------------------------------------------------------------------------ No results for input(s): TSH, T4TOTAL, T3FREE, THYROIDAB in the last 72 hours.  Invalid input(s): FREET3 ------------------------------------------------------------------------------------------------------------------ No results for input(s): VITAMINB12, FOLATE, FERRITIN, TIBC, IRON, RETICCTPCT in the last 72 hours.  Coagulation profile No results for input(s): INR, PROTIME in the last 168 hours.  No results for input(s): DDIMER in the last 72 hours.  Cardiac Enzymes No results for input(s): CKMB,  TROPONINI, MYOGLOBIN in the last 168 hours.  Invalid input(s): CK ------------------------------------------------------------------------------------------------------------------ No results found for: BNP  Inpatient Medications  Scheduled Meds: . emtricitabine-tenofovir  1 tablet Oral Daily  . famotidine (PEPCID) IV  20 mg Intravenous Q12H  . hydrocortisone  25 mg Rectal BID  . levETIRAcetam  500 mg Oral BID  . lisinopril  20 mg Oral Daily  . pantoprazole  40 mg Oral Daily  . phenytoin  100 mg Oral TID  . raltegravir  400 mg Oral BID  . sucralfate  1 g Oral TID WC & HS   Continuous Infusions: . sodium chloride 100 mL/hr at 07/12/16 2044   PRN Meds:.acetaminophen **OR** acetaminophen, clonazePAM, HYDROmorphone (DILAUDID) injection, LORazepam, ondansetron **OR** ondansetron (ZOFRAN) IV, traZODone  Micro Results No results found for this or any previous visit (from the past 240 hour(s)).  Radiology Reports Ct Abdomen Pelvis W Contrast  Result Date: 07/12/2016 CLINICAL DATA:  35 year old male with a history of blood in his stool. No history of hemorrhoids. EXAM: CT ABDOMEN AND PELVIS WITH CONTRAST TECHNIQUE: Multidetector CT imaging of the abdomen and pelvis was performed using the standard protocol following bolus administration of intravenous contrast. CONTRAST:  ISOVUE-300 IOPAMIDOL (ISOVUE-300) INJECTION 61% COMPARISON:  01/27/2016 FINDINGS: Lower chest: No acute abnormality. Hepatobiliary: No focal liver abnormality is seen. No gallstones, gallbladder wall thickening, or biliary dilatation. Pancreas: Unremarkable. No pancreatic ductal dilatation or surrounding inflammatory changes. Spleen: Normal in size without focal abnormality. Adrenals/Urinary Tract: Adrenal glands are unremarkable. Kidneys are normal, without renal calculi, focal lesion, or hydronephrosis. Bladder is unremarkable. Stomach/Bowel: Unremarkable appearance of stomach. Decompressed small bowel, with no  obstruction or transition point. Normal appendix. Partial fecalization of distal ileum. No inflammatory changes. No mesenteric edema or lymph nodes. Colonic diverticula are present. No associated inflammatory changes. No significant stool burden. Vascular/Lymphatic: Early atherosclerosis with calcifications of the right iliac system. No aneurysm or dissection. No periaortic fluid. Reproductive: Prostate is unremarkable. Other: No abdominal wall hernia or abnormality. No abdominopelvic ascites. Musculoskeletal: No acute or significant osseous findings. IMPRESSION: No acute finding to account for the given symptoms. Partial fecalization of distal small bowel just above the ileocecal valve, potentially representing chronic constipation. Signed, Yvone Neu. Loreta Ave, DO [facility] Radiology Electronically Signed   By: Gilmer Mor D.O.   On: 07/12/2016 10:43    Time Spent in minutes  25   Eddie North M.D on 07/13/2016 at 11:45 AM  Between 7am to 7pm - Pager - [phone]  After 7pm go to [email or web address] - password [facility]  [facility] -  Office  [phone]

## 2016-07-13 NOTE — Progress Notes (Signed)
EEG completed, results pending. 

## 2016-07-13 NOTE — Procedures (Signed)
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  The dominant background activity consists of 9 hertz activity seen most prominantly over the posterior head region.  There is an excessive amount of fast (beta) activity throughout the recording.  ACTIVATION:  Stepwise photic stimulation at 4-20 flashes per second was performed and did not elicit any abnormal waveforms.  Hyperventilation was not performed.  EPILEPTIFORM ACTIVITY:  There were no spikes, sharp waves or paroxysmal activity.  The patient did have a brief episode in which he was described to "shake" and there was no change in the background activity of the recording.  The patient did remark that he "almost had a seizure."  SLEEP:  No sleep  IMPRESSION:  This EEG demonstrated no focal, hemispheric, or lateralizing features.  There was an excessive amount of fast (beta) activity throughout the recording, which is generally consistent with medication such as benzodiazepines.  Correlate clinically.  As above, the patient did have a brief episode in which he was described to "shake" but there was no associated change in the background activity of the recording.  The patient did remark that he "almost had a seizure."

## 2016-07-13 NOTE — Consult Note (Signed)
Referring Provider: [facility] Primary Care Physician:  [facility] Primary Gastroenterologist:  Gentry FitzUnassigned.  Reason for Consultation:  Epigastric pain, rectal bleeding  HPI: Javier Hill is a 35 y.o. male who relocated here two year ago.[place]. He has HIV, on treatment under Dr. Ninetta LightsHatcher. He also history of hypertension and seizures. Patient gives a history of gastric ulcers diagnosed in [place] approximately 3 years ago. He does not recall having an upper endoscopy so not clear how diagnosis came about. Patient has GERD. His heartburn over the last years been refractory to twice a day PPI, Pepcid and Pepto.  Sunday night around midnight patient developed acute burning epigastric pain radiating through to his back. Pain associated with vomiting of material with "red spots'. At onset patient had a black stool, following that he passed bright red blood. No diarrhea and actually no BMs at all in two days, just bright red blood. Of note, stool was black prior to bismuth. He continues to vomit almost anything he drinks but no hematemesis now. Patient has never had this type of epigastric pain before, not even when diagnosed with a gastric ulcer. Pain unrelated to PO intake, it is constant and hurts to even to lay on left side.   No regular use of NSAID.  Patient has a history of GERD with pyrosis. Eliminated spicy foods and citrus, started Omeprazole BID a year ago but still with significant pyrosis. He consumes very little ETOH.    Past Medical History:  Diagnosis Date  . Anxiety epi  . Epilepsy (HCC)   . HIV (human immunodeficiency virus infection) (HCC)   . Hypertension   . Migraines 2016  . Panic attack   . Seizure [facility](HCC)    last seizure 09/05/2014    Past Surgical History:  Procedure Laterality Date  . ORIF CLAVICULAR FRACTURE      Prior to Admission medications   Medication Sig Start Date End Date Taking? Authorizing Provider  diphenhydrAMINE (BENADRYL) 25  MG tablet Take 25 mg by mouth every 6 (six) hours as needed for itching.   Yes Historical Provider, MD  ibuprofen (ADVIL,MOTRIN) 200 MG tablet Take 200 mg by mouth every 6 (six) hours as needed for moderate pain.   Yes Historical Provider, MD  levETIRAcetam (KEPPRA) 500 MG tablet Take 1 tablet (500 mg total) by mouth 2 (two) times daily. 06/08/16  Yes Henrietta HooverLinda C Bernhardt, NP  lisinopril (PRINIVIL,ZESTRIL) 20 MG tablet Take 1 tablet (20 mg total) by mouth daily. 05/07/16  Yes Henrietta HooverLinda C Bernhardt, NP  omeprazole (PRILOSEC) 20 MG capsule Take 20 mg by mouth 2 (two) times daily before a meal.   Yes Historical Provider, MD  oxymetazoline (AFRIN) 0.05 % nasal spray Place 1 spray into both nostrils 2 (two) times daily as needed for congestion.   Yes Historical Provider, MD  phenytoin (DILANTIN) 100 MG ER capsule Take 1 capsule (100 mg total) by mouth 3 (three) times daily. 02/14/16  Yes Massie MaroonLachina M Hollis, FNP  raltegravir (ISENTRESS) 400 MG tablet Take 1 tablet (400 mg total) by mouth 2 (two) times daily. 12/23/15  Yes Ginnie SmartJeffrey C Hatcher, MD  TRUVADA 200-300 MG tablet TAKE 1 TABLET BY MOUTH EVERY DAY 04/24/16  Yes Ginnie SmartJeffrey C Hatcher, MD    Current Facility-Administered Medications  Medication Dose Route Frequency Provider Last Rate Last Dose  . 0.9 %  sodium chloride infusion   Intravenous Continuous Vanetta MuldersScott Zackowski, MD 100 mL/hr at 07/12/16 2044    . acetaminophen (TYLENOL) tablet 650  mg  650 mg Oral Q6H PRN Haydee Salter, MD       Or  . acetaminophen (TYLENOL) suppository 650 mg  650 mg Rectal Q6H PRN Haydee Salter, MD      . clonazePAM Scarlette Calico) tablet 1 mg  1 mg Oral BID PRN Haydee Salter, MD   1 mg at 07/13/16 0233  . emtricitabine-tenofovir (TRUVADA) 200-300 MG per tablet 1 tablet  1 tablet Oral Daily Haydee Salter, MD      . famotidine (PEPCID) IVPB 20 mg premix  20 mg Intravenous Q12H Vanetta Mulders, MD 100 mL/hr at 07/13/16 0019 20 mg at 07/13/16 0019  . hydrocortisone (ANUSOL-HC) suppository 25  mg  25 mg Rectal BID Nishant Dhungel, MD      . HYDROmorphone (DILAUDID) injection 1 mg  1 mg Intravenous Q4H PRN Haydee Salter, MD   1 mg at 07/13/16 0607  . levETIRAcetam (KEPPRA) tablet 500 mg  500 mg Oral BID Haydee Salter, MD   500 mg at 07/13/16 0020  . lisinopril (PRINIVIL,ZESTRIL) tablet 20 mg  20 mg Oral Daily Haydee Salter, MD   20 mg at 07/12/16 1745  . LORazepam (ATIVAN) injection 2 mg  2 mg Intravenous Q10 min PRN Haydee Salter, MD      . ondansetron [facility]) tablet 4 mg  4 mg Oral Q6H PRN Haydee Salter, MD       Or  . ondansetron [facility]) injection 4 mg  4 mg Intravenous Q6H PRN Haydee Salter, MD   4 mg at 07/12/16 1944  . pantoprazole (PROTONIX) EC tablet 40 mg  40 mg Oral Daily Nishant Dhungel, MD      . phenytoin (DILANTIN) ER capsule 100 mg  100 mg Oral TID Haydee Salter, MD   100 mg at 07/13/16 0020  . raltegravir (ISENTRESS) tablet 400 mg  400 mg Oral BID Haydee Salter, MD   400 mg at 07/13/16 0020  . sucralfate (CARAFATE) tablet 1 g  1 g Oral TID WC & HS Nishant Dhungel, MD      . traZODone (DESYREL) tablet 25 mg  25 mg Oral QHS PRN Haydee Salter, MD        Allergies as of 07/12/2016 - Review Complete 07/12/2016  Allergen Reaction Noted  . Caffeine Other (See Comments) 07/14/2014  . Ciprofloxacin Shortness Of Breath 08/02/2014  . Penicillins Anaphylaxis 07/14/2014  . Sulfamethoxazole-trimethoprim Anaphylaxis 08/01/2014  . Tramadol Other (See Comments) 08/02/2014  . Acyclovir and related Hives and Rash 07/31/2014  . Haldol [haloperidol lactate] Other (See Comments) 07/17/2014  . Librium [chlordiazepoxide] Itching 01/05/2015  . Aloprim [allopurinol] Other (See Comments) 07/14/2014  . Ambien [zolpidem tartrate] Hives, Anxiety, and Other (See Comments) 07/31/2014  . Bactrim [sulfamethoxazole-trimethoprim] Other (See Comments) 07/17/2014  . Other Other (See Comments) 08/01/2014  . Pneumovax [pneumococcal polysaccharide vaccine] Itching and Rash  08/17/2014  . Prevnar [pneumococcal 13-val conj vacc] Itching and Rash 08/02/2014  . Watermelon flavor Rash 07/17/2014  . Zoloft [sertraline hcl] Other (See Comments) 07/14/2014    Family History  Problem Relation Age of Onset  . Adopted: Yes    Social History   Social History  . Marital status: Single    Spouse name: N/A  . Number of children: N/A  . Years of education: N/A   Occupational History  . Not on file.   Social History Main Topics  . Smoking status: Current Every Day Smoker    Packs/day: 0.10  Types: Cigarettes  . Smokeless tobacco: Never Used     Comment: cutting back 2 CIGGERETS A DAY   . Alcohol use No     Comment: occasional   . Drug use:     Types: Marijuana     Comment: COUPLE TIMES A WEEK   . Sexual activity: Not on file   Other Topics Concern   Review of Systems: All systems reviewed and negative except where noted in HPI.   Physical Exam: Vital signs in last 24 hours: Temp:  [97.4 F (36.3 C)-98 F (36.7 C)] 97.4 F (36.3 C) (10/16 0545) Pulse Rate:  [55-79] 55 (10/16 0545) Resp:  [14-21] 20 (10/16 0545) BP: (111-148)/(73-89) 111/80 (10/16 0545) SpO2:  [97 %-100 %] 100 % (10/16 0545) Weight:  [136 lb 4.8 oz (61.8 kg)] 136 lb 4.8 oz (61.8 kg) (10/15 1527) Last BM Date: 07/12/16 General:   Alert,  Hispanic male in NAD Head:  Normocephalic and atraumatic. Eyes:  Sclera clear, no icterus.   Conjunctiva pink. Ears:  Normal auditory acuity. Nose:  No deformity, discharge,  or lesions. Mouth:  No deformity or lesions.   Neck:  Supple; no masses Lungs:  Clear throughout to auscultation.   No wheezes, crackles, or rhonchi.  Heart:  Regular rate and rhythm; no murmurs, clicks, rubs,  or gallops. Abdomen:  Soft,nontender, BS active,nonpalp mass or hsm.   Rectal:  No external lesions. No blood or stool in vault. Exam not painful.   Msk:  Symmetrical without gross deformities. . Pulses:  Normal pulses noted. Extremities:  Without clubbing or  edema. Neurologic:  Alert and  oriented x4;  grossly normal neurologically. Skin:  Intact without significant lesions or rashes.. Psych:  Alert and cooperative. Normal mood and affect.  Intake/Output from previous day: 10/15 0701 - 10/16 0700 In: 3276.7 [I.V.:2176.7; IV Piggyback:1100] Out: -  Intake/Output this shift: No intake/output data recorded.  Lab Results:  Recent Labs  07/12/16 0910 07/12/16 1818 07/12/16 2300 07/13/16 0543  WBC 6.5  --   --  7.6  HGB 16.4 14.5 15.0 15.2  HCT 46.5 42.6 44.6 45.2  PLT 241  --   --  233   BMET  Recent Labs  07/12/16 0910 07/13/16 0543  NA 137 141  K 4.1 4.5  CL 106 108  CO2 26 28  GLUCOSE 104* 99  BUN 8 <5*  CREATININE 0.81 0.91  CALCIUM 9.6 9.3   LFT  Recent Labs  07/12/16 0910  PROT 8.2*  ALBUMIN 5.1*  AST 23  ALT 26  ALKPHOS 94  BILITOT 0.4   Studies/Results: Ct Abdomen Pelvis W Contrast  Result Date: 07/12/2016 CLINICAL DATA:  35 year old male with a history of blood in his stool. No history of hemorrhoids. EXAM: CT ABDOMEN AND PELVIS WITH CONTRAST TECHNIQUE: Multidetector CT imaging of the abdomen and pelvis was performed using the standard protocol following bolus administration of intravenous contrast. CONTRAST:  ISOVUE-300 IOPAMIDOL (ISOVUE-300) INJECTION 61% COMPARISON:  01/27/2016 FINDINGS: Lower chest: No acute abnormality. Hepatobiliary: No focal liver abnormality is seen. No gallstones, gallbladder wall thickening, or biliary dilatation. Pancreas: Unremarkable. No pancreatic ductal dilatation or surrounding inflammatory changes. Spleen: Normal in size without focal abnormality. Adrenals/Urinary Tract: Adrenal glands are unremarkable. Kidneys are normal, without renal calculi, focal lesion, or hydronephrosis. Bladder is unremarkable. Stomach/Bowel: Unremarkable appearance of stomach. Decompressed small bowel, with no obstruction or transition point. Normal appendix. Partial fecalization of distal ileum.  No inflammatory changes. No mesenteric edema or lymph nodes. Colonic  diverticula are present. No associated inflammatory changes. No significant stool burden. Vascular/Lymphatic: Early atherosclerosis with calcifications of the right iliac system. No aneurysm or dissection. No periaortic fluid. Reproductive: Prostate is unremarkable. Other: No abdominal wall hernia or abnormality. No abdominopelvic ascites. Musculoskeletal: No acute or significant osseous findings. IMPRESSION: No acute finding to account for the given symptoms. Partial fecalization of distal small bowel just above the ileocecal valve, potentially representing chronic constipation. Signed, Yvone Neu. Loreta Ave, DO [facility] Radiology Electronically Signed   By: Gilmer Mor D.O.   On: 07/12/2016 10:43   IMPRESSION  / PLAN:   41. 35 year old Hispanic male with acute epigastric pain, vomiting (? Small volume hematemesis), and rectal bleeding. LFTs, lipase, CTscan unrevealing. Patient gives a history of PUD in [place] 3 years ago but cannot remember having EGD or how it was diagnosed. Hgb, BUN normal speaking against significant GI bleeding.   -It isn't unreasonable to pursue upper endoscopy given reports of hematemesis and his ongoing epigastric pain. His rectal bleeding is probably perianal in nature but if persists then will consider outpatient colonoscopy. Will make patient NPO now to see if he can be added on to Endoscopy schedule.   2. GERD, breakthrough pyrosis on BID PPI. Further evaluation at time of EGD.   3. HIV, on treatment under direction of Dr. Ninetta Lights  4. HTN, on ACEI  5. Seizures, on treatment.    Willette Cluster  07/13/2016, 10:56 AM

## 2016-07-13 NOTE — Consult Note (Signed)
[facility]    Provider:  Dr Lucia Gaskins Referring Provider:  Inpatient, Dr. Gonzella Lex CC:  seizures  HPI:  Javier Hill is a 35 y.o. male with PMHx of seizures, medical noncompliance, HTN, pseudoseizures, HIV on antivirals, anxiety and panic attacks. He presented with abdominal pain, nausea, vomiting, blood in stool and diarrhea. He is on keppra and dilantin however dilantin levels have been subtherapeutic. Heme+ stool. Patient is not post-ictal, he is clear and concise, good historian, he says he just had a seizure, he was laying down and he was falling asleep and he started getting anxious and felt like he needed oxygen. His O2 levels were normal per the nurse, he remembers people around him, putting up the sides, eyes went blank. He says he had an attack. He says he feels tired but not confused.  He takes his dilantin 3x a day and endorses compliance with keppra and dilantin he misses it very rarely. He was loaded with keppra and Dilantin today and will watch the levels.   CT of the head 2016: normal personally reviewed images  IMPRESSION: Normal head CT with no acute intracranial process identified.  Review of Systems: Patient complains of symptoms per HPI as well as the following symptoms: no SOB, he has some dizziness. Pertinent negatives per HPI. All others negative.   Social History   Social History  . Marital status: Single    Spouse name: N/A  . Number of children: N/A  . Years of education: N/A   Occupational History  . Not on file.   Social History Main Topics  . Smoking status: Current Every Day Smoker    Packs/day: 0.10    Types: Cigarettes  . Smokeless tobacco: Never Used     Comment: cutting back 2 CIGGERETS A DAY   . Alcohol use No     Comment: occasional   . Drug use:     Types: Marijuana     Comment: COUPLE TIMES A WEEK   . Sexual activity: Not on file   Other Topics Concern  . Not on file   Social History Narrative  . No  narrative on file    Family History  Problem Relation Age of Onset  . Adopted: Yes    Past Medical History:  Diagnosis Date  . Anxiety epi  . Epilepsy (HCC)   . HIV (human immunodeficiency virus infection) (HCC)   . Hypertension   . Migraines 2016  . Panic attack   . Seizure [facility])    last seizure 09/05/2014    Past Surgical History:  Procedure Laterality Date  . ORIF CLAVICULAR FRACTURE      Current Facility-Administered Medications  Medication Dose Route Frequency Provider Last Rate Last Dose  . 0.9 %  sodium chloride infusion   Intravenous Continuous Vanetta Mulders, MD 100 mL/hr at 07/12/16 2044    . acetaminophen (TYLENOL) tablet 650 mg  650 mg Oral Q6H PRN Haydee Salter, MD       Or  . acetaminophen (TYLENOL) suppository 650 mg  650 mg Rectal Q6H PRN Haydee Salter, MD      . clonazePAM Scarlette Calico) tablet 1 mg  1 mg Oral BID PRN Haydee Salter, MD   1 mg at 07/13/16 0233  . emtricitabine-tenofovir (TRUVADA) 200-300 MG per tablet 1 tablet  1 tablet Oral Daily Haydee Salter, MD   1 tablet at 07/13/16 1112  . famotidine (PEPCID) IVPB 20 mg premix  20 mg Intravenous Q12H Vanetta Mulders, MD  100 mL/hr at 07/13/16 1231 20 mg at 07/13/16 1231  . fosPHENYtoin (CEREBYX) 500 mg PE in sodium chloride 0.9 % 25 mL IVPB  500 mg PE Intravenous STAT Anson Fret, MD      . hydrocortisone (ANUSOL-HC) suppository 25 mg  25 mg Rectal BID Nishant Dhungel, MD   25 mg at 07/13/16 1114  . HYDROmorphone (DILAUDID) injection 1 mg  1 mg Intravenous Q4H PRN Haydee Salter, MD   1 mg at 07/13/16 1113  . levETIRAcetam (KEPPRA) tablet 500 mg  500 mg Oral BID Haydee Salter, MD   500 mg at 07/13/16 1114  . lisinopril (PRINIVIL,ZESTRIL) tablet 20 mg  20 mg Oral Daily Haydee Salter, MD   20 mg at 07/13/16 1113  . LORazepam (ATIVAN) injection 2 mg  2 mg Intravenous Q10 min PRN Haydee Salter, MD   2 mg at 07/13/16 1200  . ondansetron (ZOFRAN) tablet 4 mg  4 mg Oral Q6H PRN Haydee Salter, MD        Or  . ondansetron [facility]) injection 4 mg  4 mg Intravenous Q6H PRN Haydee Salter, MD   4 mg at 07/12/16 1944  . pantoprazole (PROTONIX) EC tablet 40 mg  40 mg Oral Daily Nishant Dhungel, MD      . phenytoin (DILANTIN) ER capsule 100 mg  100 mg Oral TID Haydee Salter, MD   100 mg at 07/13/16 1113  . raltegravir (ISENTRESS) tablet 400 mg  400 mg Oral BID Haydee Salter, MD   400 mg at 07/13/16 1113  . sucralfate (CARAFATE) tablet 1 g  1 g Oral TID WC & HS Nishant Dhungel, MD      . traZODone (DESYREL) tablet 25 mg  25 mg Oral QHS PRN Haydee Salter, MD        Allergies as of 07/12/2016 - Review Complete 07/12/2016  Allergen Reaction Noted  . Caffeine Other (See Comments) 07/14/2014  . Ciprofloxacin Shortness Of Breath 08/02/2014  . Penicillins Anaphylaxis 07/14/2014  . Sulfamethoxazole-trimethoprim Anaphylaxis 08/01/2014  . Tramadol Other (See Comments) 08/02/2014  . Acyclovir and related Hives and Rash 07/31/2014  . Haldol [haloperidol lactate] Other (See Comments) 07/17/2014  . Librium [chlordiazepoxide] Itching 01/05/2015  . Aloprim [allopurinol] Other (See Comments) 07/14/2014  . Ambien [zolpidem tartrate] Hives, Anxiety, and Other (See Comments) 07/31/2014  . Bactrim [sulfamethoxazole-trimethoprim] Other (See Comments) 07/17/2014  . Other Other (See Comments) 08/01/2014  . Pneumovax [pneumococcal polysaccharide vaccine] Itching and Rash 08/17/2014  . Prevnar [pneumococcal 13-val conj vacc] Itching and Rash 08/02/2014  . Watermelon flavor Rash 07/17/2014  . Zoloft [sertraline hcl] Other (See Comments) 07/14/2014    Vitals: BP (!) 137/93   Pulse 66   Temp 98 F (36.7 C) (Oral)   Resp 20   Ht 5\' 5"  (1.651 m)   Wt 61.8 kg (136 lb 4.8 oz)   SpO2 100%   BMI 22.68 kg/m  Last Weight:  Wt Readings from Last 1 Encounters:  07/12/16 61.8 kg (136 lb 4.8 oz)   Last Height:   Ht Readings from Last 1 Encounters:  07/12/16 5\' 5"  (1.651 m)    Heme + stool Hgb/hct  15/44.6 Ct abdomen c/w chronic constipation Bmp with creatinine 0.91 Phenytoin level <2.5  keppra level pending  Physical exam: Exam: Gen: NAD, conversant, good historian, oriented to self, date, day, month, year                 CV: RRR, no  MRG. No Carotid Bruits. No peripheral edema, warm, nontender Eyes: Conjunctivae clear without exudates or hemorrhage HEENT: Normocephalic, atraumatic  Neuro: Detailed Neurologic Exam  Speech:    Speech is normal; fluent and spontaneous with normal comprehension.  Cognition:    The patient is oriented to person, place, and time;     recent and remote memory intact;     language fluent;     normal attention, concentration,     fund of knowledge Cranial Nerves:    The pupils are equal, round, and reactive to light. The fundi are flat. Visual fields are full to finger confrontation. Extraocular movements are intact. Trigeminal sensation is intact and the muscles of mastication are normal. The face is symmetric. The palate elevates in the midline. Hearing intact. Voice is normal. Shoulder shrug is normal. The tongue has normal motion without fasciculations.   Coordination:    Normal finger to nose and heel to shin.   Gait:    Deferred  Motor Observation:    No asymmetry, no atrophy, and no involuntary movements noted. Tone:    Normal muscle tone.    Posture:    Posture is normal in bed    Strength:    Strength is V/V in the upper and lower limbs.      Sensation: intact to LT     Reflex Exam:  DTR's:    Deep tendon reflexes in the upper and lower extremities are normal bilaterally.   Toes:    The toes are equivocal bilaterally.   Clonus:    Clonus is absent.      Assessment/Plan:   Javier Hill is a 35 y.o. male with PMHx of seizures, medical noncompliance, HTN, pseudoseizures, HIV on antivirals, anxiety and panic attacks. He presented with abdominal pain, nausea, vomiting, blood in stool. He reports he recently had a  seizure within the last hour for 5 minutes, he is alert, not post-ictal and gives me a clear description. Per nurse episode was questionable episode for non-epileptic event, description does not sound like epileptic activit,  he was given 2mg  ativan. Dilantin levels subtherapeutic. Loaded with Keppra. Will also load with fosphenytoin and check levels tomorrow. No eeg necessary at this time, will continue to follow.  Personally examined patient and images, and have participated in and made any corrections needed to history, physical, neuro exam,assessment and plan as stated above.  I have personally obtained the history, evaluated lab date, reviewed imaging studies and agree with radiology interpretations.    Javier DeanAntonia Nashley Cordoba, MD Neurohospitalist [phone] [facility]  A total of 60 minutes was spent face-to-face and in the care of this  patient. Over half this time was spent on counseling patient on the seizure vs nonepileptic event diagnosis and different diagnostic and therapeutic options available. No family is at bedside.

## 2016-07-14 SURGERY — EGD (ESOPHAGOGASTRODUODENOSCOPY)
Anesthesia: Monitor Anesthesia Care

## 2016-07-14 NOTE — Discharge Summary (Signed)
Physician Discharge Summary  [facility] ZOX:[id number] DOB: 12/18/1980 DOA: 07/12/2016  PCP: No PCP Per Patient  Admit date: 07/12/2016 Discharge date: 07/14/2016  Admitted From: home Disposition:  home  Recommendations for Outpatient Follow-up:  1. Follow up with PCP in 1 weeks  patient signed out AMA  On 07/13/2016   Discharge Condition:guarded CODE STATUS:full code    Discharge Diagnoses:  Principal Problem:   GI bleed   Active Problems:   Seizure (HCC)   HIV (human immunodeficiency virus infection) (HCC)   Essential hypertension   Anxiety state   Epigastric pain   Hematemesis with nausea  HPI 35 year old male with history of HIV on HAART (last CD4 >1000), seizure disorder, reported history of peptic acid disease and underwent EGD in [place] 3 years back presented to the ED with persistent painful bright red blood per rectum for the past 3 days. He also reports an episode of vomiting with streaky hematemesis. In the ED vitals in H&H stable. Hemoccult-positive. Patient reports severe epigastric pain as well. Observation under hospitalist service.  Principal Problem: Rectal bleed Painful hemorrhoids versus? Proctitis. Also reports history of peptic ulcer disease. Recent use of naproxen for back pain as outpt. smokes 2-3 cigarettes a day. Added PPI, continued Pepcid. Added Carafate.  H&H stable. GI consulted with plan on EGD on 10/17, however pt demanded tio leave AMA during the evening , pulled IV line and started arguing with nursing staff. He did not wait for covering NP to come by and speak with him . He signed out AMA and left.   Active Problems:   Seizures weith status epilepticus (HCC) On Dilantin with persistently subtherapeutic levels. Also on keppra. Reported being compliant with his medication. Not sure if interaction with ART is reducing plasma concentration of Dilantin.  Had an episode of tonic clonic seizure intermittently lasting 5 minutes.  Given IV ativan. neutrology consulted and loaded with dilantin and keppra..  No further seizure activity. EEG wihtout acute seizure activity.    HIV (human immunodeficiency virus infection) (HCC) Continue ART. Compliant with medication. Last CD4 >1000). Follow up with Dr Ninetta Lights    Essential hypertension Stable    Tobacco use  counseled on cessation     Family Communication  : None at bedside    Consults  :   [facility]  Procedures  : EEG   Discharge Instructions     Medication List    ASK your doctor about these medications   diphenhydrAMINE 25 MG tablet Commonly known as:  BENADRYL Take 25 mg by mouth every 6 (six) hours as needed for itching.   ibuprofen 200 MG tablet Commonly known as:  ADVIL,MOTRIN Take 200 mg by mouth every 6 (six) hours as needed for moderate pain.   levETIRAcetam 500 MG tablet Commonly known as:  KEPPRA Take 1 tablet (500 mg total) by mouth 2 (two) times daily.   lisinopril 20 MG tablet Commonly known as:  PRINIVIL,ZESTRIL Take 1 tablet (20 mg total) by mouth daily.   omeprazole 20 MG capsule Commonly known as:  PRILOSEC Take 20 mg by mouth 2 (two) times daily before a meal.   oxymetazoline 0.05 % nasal spray Commonly known as:  AFRIN Place 1 spray into both nostrils 2 (two) times daily as needed for congestion.   phenytoin 100 MG ER capsule Commonly known as:  DILANTIN Take 1 capsule (100 mg total) by mouth 3 (three) times daily.   raltegravir 400 MG tablet Commonly known as:  ISENTRESS Take 1  tablet (400 mg total) by mouth 2 (two) times daily.   TRUVADA 200-300 MG tablet Generic drug:  emtricitabine-tenofovir TAKE 1 TABLET BY MOUTH EVERY DAY       Allergies  Allergen Reactions  . Caffeine Other (See Comments)    Other reaction(s): Seizures  . Ciprofloxacin Shortness Of Breath  . Penicillins Anaphylaxis    Has patient had a PCN reaction causing immediate rash, facial/tongue/throat swelling, SOB  or lightheadedness with hypotension: rush, yellow eyes Has patient had a PCN reaction causing severe rash involving mucus membranes or skin necrosis:  Has patient had a PCN reaction that required hospitalization-yes, ed visit Has patient had a PCN reaction occurring within the last 10 years: yes If all of the above answers are "NO", then may proceed with Cephalosporin use.   . Sulfamethoxazole-Trimethoprim Anaphylaxis  . Tramadol Other (See Comments)    Seizures   . Acyclovir And Related Hives and Rash  . Haldol [Haloperidol Lactate] Other (See Comments)    Muscle spasms  . Librium [Chlordiazepoxide] Itching  . Aloprim [Allopurinol] Other (See Comments)    unknown  . Ambien [Zolpidem Tartrate] Hives, Anxiety and Other (See Comments)    Insomnia   . Bactrim [Sulfamethoxazole-Trimethoprim] Other (See Comments)  . Other Other (See Comments)    Other reaction(s): Seizures "Nabenzata" Pt is unsure of what type of antibiotic  . Pneumovax [Pneumococcal Polysaccharide Vaccine] Itching and Rash  . Prevnar [Pneumococcal 13-Val Conj Vacc] Itching and Rash  . Watermelon Flavor Rash  . Zoloft [Sertraline Hcl] Other (See Comments)    unknown     Procedures/Studies: Ct Abdomen Pelvis W Contrast  Result Date: 07/12/2016 CLINICAL DATA:  35 year old male with a history of blood in his stool. No history of hemorrhoids. EXAM: CT ABDOMEN AND PELVIS WITH CONTRAST TECHNIQUE: Multidetector CT imaging of the abdomen and pelvis was performed using the standard protocol following bolus administration of intravenous contrast. CONTRAST:  100mL ISOVUE-300 IOPAMIDOL (ISOVUE-300) INJECTION 61% COMPARISON:  01/27/2016 FINDINGS: Lower chest: No acute abnormality. Hepatobiliary: No focal liver abnormality is seen. No gallstones, gallbladder wall thickening, or biliary dilatation. Pancreas: Unremarkable. No pancreatic ductal dilatation or surrounding inflammatory changes. Spleen: Normal in size without focal  abnormality. Adrenals/Urinary Tract: Adrenal glands are unremarkable. Kidneys are normal, without renal calculi, focal lesion, or hydronephrosis. Bladder is unremarkable. Stomach/Bowel: Unremarkable appearance of stomach. Decompressed small bowel, with no obstruction or transition point. Normal appendix. Partial fecalization of distal ileum. No inflammatory changes. No mesenteric edema or lymph nodes. Colonic diverticula are present. No associated inflammatory changes. No significant stool burden. Vascular/Lymphatic: Early atherosclerosis with calcifications of the right iliac system. No aneurysm or dissection. No periaortic fluid. Reproductive: Prostate is unremarkable. Other: No abdominal wall hernia or abnormality. No abdominopelvic ascites. Musculoskeletal: No acute or significant osseous findings. IMPRESSION: No acute finding to account for the given symptoms. Partial fecalization of distal small bowel just above the ileocecal valve, potentially representing chronic constipation. Signed, Yvone NeuJaime S. Loreta AveWagner, DO [facility] Electronically Signed   By: Gilmer MorJaime  Wagner D.O.   On: 07/12/2016 10:43         Discharge Exam: Vitals:   07/13/16 1206 07/13/16 1457  BP: (!) 137/93 (!) 162/84  Pulse: 66 (!) 50  Resp:  18  Temp: 98 F (36.7 C) 98.6 F (37 C)   Vitals:   07/12/16 1527 07/13/16 0545 07/13/16 1206 07/13/16 1457  BP: (!) 148/73 111/80 (!) 137/93 (!) 162/84  Pulse: (!) 57 (!) 55 66 (!)  50  Resp: 17 20  18   Temp: 98 F (36.7 C) 97.4 F (36.3 C) 98 F (36.7 C) 98.6 F (37 C)  TempSrc: Oral Oral Oral   SpO2: 98% 100% 100% 100%  Weight: 61.8 kg (136 lb 4.8 oz)     Height: 5\' 5"  (1.651 m)         The results of significant diagnostics from this hospitalization (including imaging, microbiology, ancillary and laboratory) are listed below for reference.     Microbiology: No results found for this or any previous visit (from the  past 240 hour(s)).   Labs: BNP (last 3 results) No results for input(s): BNP in the last 8760 hours. Basic Metabolic Panel:  Recent Labs Lab 07/12/16 0910 07/13/16 0543  NA 137 141  K 4.1 4.5  CL 106 108  CO2 26 28  GLUCOSE 104* 99  BUN 8 <5*  CREATININE 0.81 0.91  CALCIUM 9.6 9.3   Liver Function Tests:  Recent Labs Lab 07/12/16 0910  AST 23  ALT 26  ALKPHOS 94  BILITOT 0.4  PROT 8.2*  ALBUMIN 5.1*    Recent Labs Lab 07/12/16 0910  LIPASE 27   No results for input(s): AMMONIA in the last 168 hours. CBC:  Recent Labs Lab 07/12/16 0910 07/12/16 1818 07/12/16 2300 07/13/16 0543  WBC 6.5  --   --  7.6  NEUTROABS 4.4  --   --   --   HGB 16.4 14.5 15.0 15.2  HCT 46.5 42.6 44.6 45.2  MCV 92.4  --   --  94.6  PLT 241  --   --  233   Cardiac Enzymes: No results for input(s): CKTOTAL, CKMB, CKMBINDEX, TROPONINI in the last 168 hours. BNP: Invalid input(s): POCBNP CBG: No results for input(s): GLUCAP in the last 168 hours. D-Dimer No results for input(s): DDIMER in the last 72 hours. Hgb A1c No results for input(s): HGBA1C in the last 72 hours. Lipid Profile No results for input(s): CHOL, HDL, LDLCALC, TRIG, CHOLHDL, LDLDIRECT in the last 72 hours. Thyroid function studies No results for input(s): TSH, T4TOTAL, T3FREE, THYROIDAB in the last 72 hours.  Invalid input(s): FREET3 Anemia work up No results for input(s): VITAMINB12, FOLATE, FERRITIN, TIBC, IRON, RETICCTPCT in the last 72 hours. Urinalysis    Component Value Date/Time   COLORURINE YELLOW 12/13/2014 1020   APPEARANCEUR CLEAR 12/13/2014 1020   LABSPEC 1.010 07/12/2015 1513   PHURINE 7.0 07/12/2015 1513   GLUCOSEU NEGATIVE 07/12/2015 1513   HGBUR NEGATIVE 07/12/2015 1513   BILIRUBINUR NEGATIVE 07/12/2015 1513   KETONESUR NEGATIVE 07/12/2015 1513   PROTEINUR NEGATIVE 07/12/2015 1513   UROBILINOGEN 0.2 07/12/2015 1513   NITRITE NEGATIVE 07/12/2015 1513   LEUKOCYTESUR NEGATIVE 07/12/2015  1513   Sepsis Labs Invalid input(s): PROCALCITONIN,  WBC,  LACTICIDVEN Microbiology No results found for this or any previous visit (from the past 240 hour(s)).   Time coordinating discharge: < 30 minutes  SIGNED:   Eddie North, MD  [facility] 07/14/2016, 4:57 PM Pager   If 7PM-7AM, please contact night-coverage [email or web address] Password [email or web address]

## 2016-07-17 ENCOUNTER — Other Ambulatory Visit: Payer: Self-pay | Admitting: Family Medicine

## 2016-07-17 DIAGNOSIS — G40901 Epilepsy, unspecified, not intractable, with status epilepticus: Secondary | ICD-10-CM

## 2016-07-21 ENCOUNTER — Ambulatory Visit: Payer: Self-pay

## 2016-07-21 ENCOUNTER — Other Ambulatory Visit: Payer: Self-pay | Admitting: *Deleted

## 2016-07-21 DIAGNOSIS — B20 Human immunodeficiency virus [HIV] disease: Secondary | ICD-10-CM

## 2016-07-21 MED ORDER — RALTEGRAVIR POTASSIUM 400 MG PO TABS: 400.0000 mg | ORAL_TABLET | Freq: Two times a day (BID) | ORAL | 5 refills | Status: DC

## 2016-07-21 MED ORDER — EMTRICITABINE-TENOFOVIR DF 200-300 MG PO TABS: 1.0000 | ORAL_TABLET | Freq: Every day | ORAL | 5 refills | Status: DC

## 2016-07-22 ENCOUNTER — Encounter: Payer: Self-pay | Admitting: Infectious Diseases

## 2016-07-24 ENCOUNTER — Other Ambulatory Visit: Payer: Self-pay | Admitting: Family Medicine

## 2016-07-24 DIAGNOSIS — G40901 Epilepsy, unspecified, not intractable, with status epilepticus: Secondary | ICD-10-CM

## 2016-07-24 MED ORDER — LEVETIRACETAM 500 MG PO TABS: 500.0000 mg | ORAL_TABLET | Freq: Two times a day (BID) | ORAL | 0 refills | Status: DC

## 2016-08-13 ENCOUNTER — Encounter: Payer: Self-pay | Admitting: Family Medicine

## 2016-08-13 ENCOUNTER — Ambulatory Visit (INDEPENDENT_AMBULATORY_CARE_PROVIDER_SITE_OTHER): Payer: Self-pay | Admitting: Family Medicine

## 2016-08-13 VITALS — BP 125/91 | HR 53 | Temp 98.7°F | Resp 14 | Ht 64.0 in | Wt 137.0 lb

## 2016-08-13 DIAGNOSIS — G40901 Epilepsy, unspecified, not intractable, with status epilepticus: Secondary | ICD-10-CM

## 2016-08-13 DIAGNOSIS — Z131 Encounter for screening for diabetes mellitus: Secondary | ICD-10-CM

## 2016-08-13 DIAGNOSIS — Z23 Encounter for immunization: Secondary | ICD-10-CM

## 2016-08-13 MED ORDER — LEVETIRACETAM 500 MG PO TABS
500.0000 mg | ORAL_TABLET | Freq: Two times a day (BID) | ORAL | 0 refills | Status: DC
Start: 2016-08-13 — End: 2016-09-23

## 2016-08-13 MED ORDER — FAMOTIDINE 20 MG PO TABS: 20.0000 mg | ORAL_TABLET | Freq: Two times a day (BID) | ORAL | 3 refills | Status: DC

## 2016-08-13 MED ORDER — OMEPRAZOLE 40 MG PO CPDR: 40.0000 mg | DELAYED_RELEASE_CAPSULE | Freq: Every day | ORAL | 3 refills | Status: DC

## 2016-08-13 MED ORDER — PHENYTOIN SODIUM EXTENDED 100 MG PO CAPS: 100.0000 mg | ORAL_CAPSULE | Freq: Three times a day (TID) | ORAL | 3 refills | Status: DC

## 2016-08-13 MED ORDER — LISINOPRIL 20 MG PO TABS: 20.0000 mg | ORAL_TABLET | Freq: Every day | ORAL | 3 refills | Status: DC

## 2016-08-13 NOTE — Patient Instructions (Signed)
Adding Pepcid two times a day Increasing omeprazole to 40 mg daily.

## 2016-08-14 LAB — HEMOGLOBIN A1C
Hgb A1c MFr Bld: 4.9 % (ref <5.7)
Mean Plasma Glucose: 94 mg/dL

## 2016-08-17 NOTE — Progress Notes (Signed)
Javier Hill, is a 35 y.o. male  KGM:[id number]  UYQ:[id number]  DOB - [birth year]  CC:  Chief Complaint  Patient presents with  . Hypertension  . Seizures       HPI: Javier Hill is a 35 y.o. male here for follow-up hypertension and seizure disorder. He is also HIV + He is on lisinopril 20, Keppra 500 bid and dilantin ER 100 tid. He recently ran out of dilantan. He denies recent seizure activity. His major complaint is of continued insomnia. He denies change in status since last visit. He does report frequent heartburn. He reports 3-4 cigarettes daily, using marijuana and occ alcohol.   Health maintenance:To receive influenza today.    Past Medical History:  Diagnosis Date  . Anxiety epi  . Epilepsy (HCC)   . HIV (human immunodeficiency virus infection) (HCC)   . Hypertension   . Migraines 2016  . Panic attack   . Seizure [facility](HCC)    last seizure 09/05/2014   Current Outpatient Prescriptions on File Prior to Visit  Medication Sig Dispense Refill  . diphenhydrAMINE (BENADRYL) 25 MG tablet Take 25 mg by mouth every 6 (six) hours as needed for itching.    Marland Kitchen. emtricitabine-tenofovir (TRUVADA) 200-300 MG tablet Take 1 tablet by mouth daily. 30 tablet 5  . oxymetazoline (AFRIN) 0.05 % nasal spray Place 1 spray into both nostrils 2 (two) times daily as needed for congestion.    . raltegravir (ISENTRESS) 400 MG tablet Take 1 tablet (400 mg total) by mouth 2 (two) times daily. 60 tablet 5  . ibuprofen (ADVIL,MOTRIN) 200 MG tablet Take 200 mg by mouth every 6 (six) hours as needed for moderate pain.    . [DISCONTINUED] gabapentin (NEURONTIN) 300 MG capsule Take 1 capsule (300 mg total) by mouth 3 (three) times daily. (Patient not taking: Reported on 10/23/2014) 90 capsule 3  . [DISCONTINUED] promethazine (PHENERGAN) 25 MG tablet Take 1 tablet (25 mg total) by mouth every 6 (six) hours as needed for nausea or vomiting. 20 tablet 0   No current facility-administered  medications on file prior to visit.    Family History  Problem Relation Age of Onset  . Adopted: Yes   Social History   Social History  . Marital status: Single    Spouse name: N/A  . Number of children: N/A  . Years of education: N/A   Occupational History  . Not on file.   Social History Main Topics  . Smoking status: Current Every Day Smoker    Packs/day: 0.10    Types: Cigarettes  . Smokeless tobacco: Never Used     Comment: 2-3 a day   . Alcohol use No     Comment: occasional   . Drug use:     Types: Marijuana     Comment: COUPLE TIMES A WEEK   . Sexual activity: Not on file   Other Topics Concern  . Not on file   Social History Narrative  . No narrative on file    Review of Systems: Constitutional: Negative Skin: Negative HENT: Negative  Eyes: Negative  Neck: Negative Respiratory: Negative Cardiovascular: Negative Gastrointestinal: + heartburn Genitourina+ upper back pain Neurological:+ headaches Hematological: Negative  Psychiatric/Behavioral: Negative    Objective:   Vitals:   08/13/16 1133  BP: (!) 125/91  Pulse: (!) 53  Resp: 14  Temp: 98.7 F (37.1 C)    Physical Exam: Constitutional: Patient appears well-developed and well-nourished. No distress. HENT: Normocephalic, atraumatic, External right and left ear normal.  Oropharynx is clear and moist.  Eyes: Conjunctivae and EOM are normal. PERRLA, no scleral icterus. Neck: Normal ROM. Neck supple. No lymphadenopathy, No thyromegaly. CVS: RRR, S1/S2 +, no murmurs, no gallops, no rubs Pulmonary: Effort and breath sounds normal, no stridor, rhonchi, wheezes, rales.  Abdominal: Soft. Normoactive BS,, no distension, tenderness, rebound or guarding.  Musculoskeletal: Normal range of motion. No edema and no tenderness.  Neuro: Alert.Normal muscle tone coordination. Non-focal Skin: Skin is warm and dry. No rash noted. Not diaphoretic. No erythema. No pallor. Psychiatric: Normal mood and affect.  Behavior, judgment, thought content normal.  Lab Results  Component Value Date   WBC 7.6 07/13/2016   HGB 15.2 07/13/2016   HCT 45.2 07/13/2016   MCV 94.6 07/13/2016   PLT 233 07/13/2016   Lab Results  Component Value Date   CREATININE 0.91 07/13/2016   BUN <5 (L) 07/13/2016   NA 141 07/13/2016   K 4.5 07/13/2016   CL 108 07/13/2016   CO2 28 07/13/2016    Lab Results  Component Value Date   HGBA1C 4.9 08/13/2016   Lipid Panel     Component Value Date/Time   CHOL 134 05/06/2015 1121   TRIG 103 05/06/2015 1121   HDL 42 05/06/2015 1121   CHOLHDL 3.2 05/06/2015 1121   VLDL 21 05/06/2015 1121   LDLCALC 71 05/06/2015 1121        Assessment and plan:   1. Status epilepticus (HCC)  - levETIRAcetam (KEPPRA) 500 MG tablet; Take 1 tablet (500 mg total) by mouth 2 (two) times daily.  Dispense: 60 tablet; Refill: 0 - phenytoin (DILANTIN) 100 MG ER capsule; Take 1 capsule (100 mg total) by mouth 3 (three) times daily.  Dispense: 90 capsule; Refill: 3  2. Screening for diabetes mellitus  - Hemoglobin A1c  3. Need for prophylactic vaccination and inoculation against influenza  - Flu Vaccine QUAD 36+ mos PF IM (Fluarix & Fluzone Quad PF)   Return in about 6 months (around 02/10/2017).  The patient was given clear instructions to go to ER or return to medical center if symptoms don't improve, worsen or new problems develop. The patient verbalized understanding.    Henrietta HooverLinda C Bernhardt FNP  08/17/2016, 10:48 AM

## 2016-08-31 ENCOUNTER — Other Ambulatory Visit: Payer: Self-pay

## 2016-09-14 ENCOUNTER — Ambulatory Visit: Payer: Self-pay | Admitting: Infectious Diseases

## 2016-09-15 IMAGING — CT CT ABD-PELV W/ CM
2 of 4 series · 16 of 46 positions shown, 18 images · IV contrast (APPLIED)
Comparison: Plain films 09/06/2014.  CT 08/21/2014.

CLINICAL DATA: Abdominal pain, bloody diarrhea.

EXAM:
CT ABDOMEN AND PELVIS WITH CONTRAST
TECHNIQUE: Multidetector CT imaging of the abdomen and pelvis was performed
using the standard protocol following bolus administration of
intravenous contrast.
CONTRAST:  100mL VMLXPQ-3DD IOPAMIDOL (VMLXPQ-3DD) INJECTION 61%

[Series 2: axial st · axial · 0.68mm/px · z∈[+765,+1190]mm · 13 of 93 slices shown, 15 images]
[im 4/93  soft-tissue]
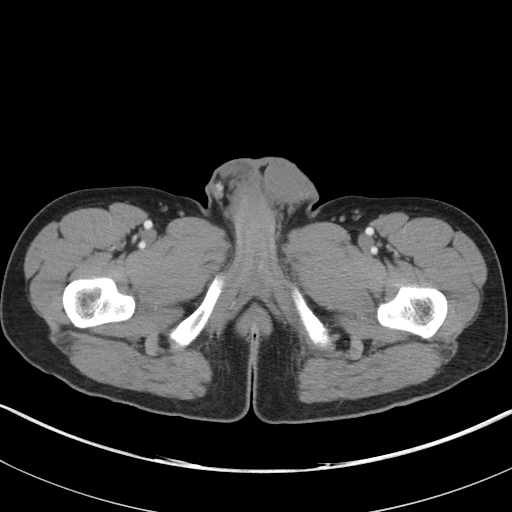
[im 4/93  bone]
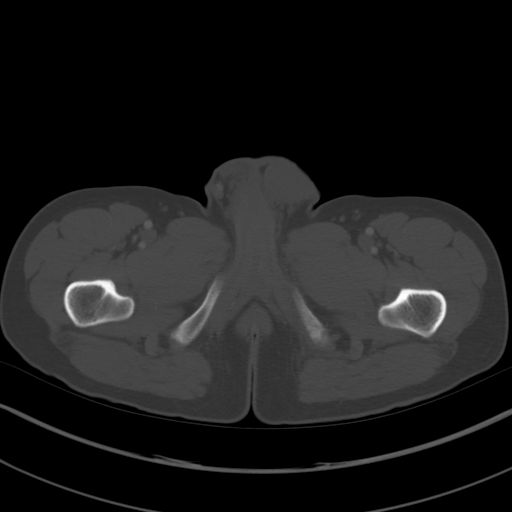
[im 12/93  soft-tissue]
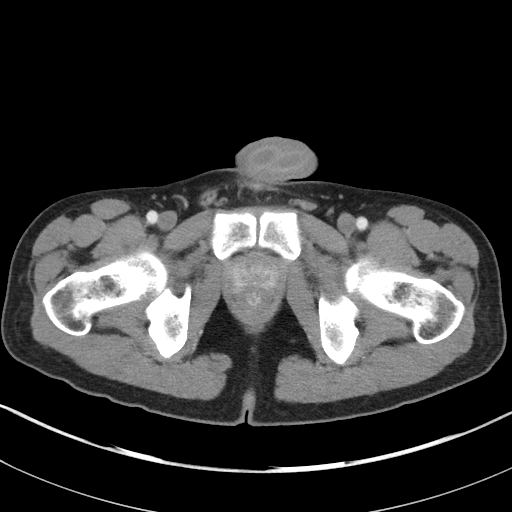
[im 19/93  soft-tissue]
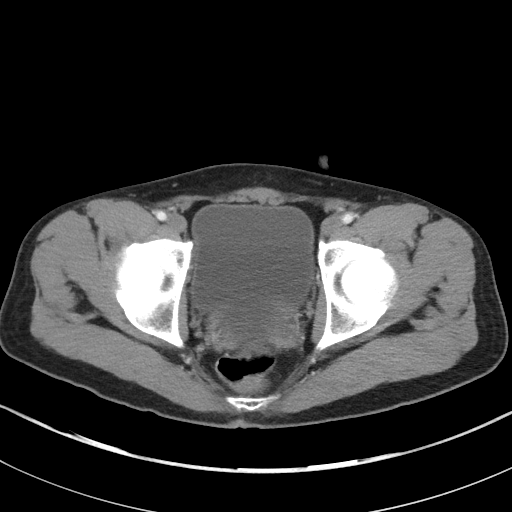
[im 26/93  soft-tissue]
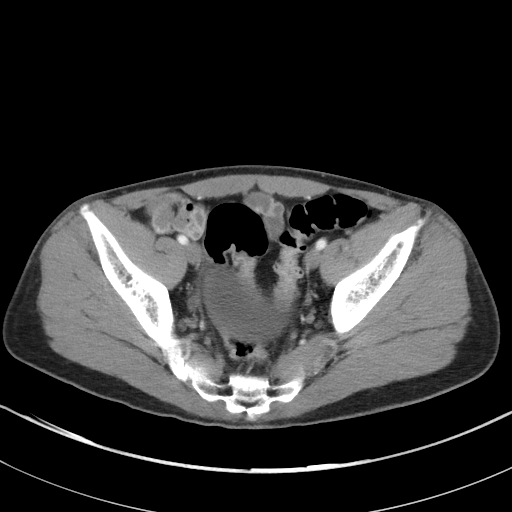
[im 34/93  soft-tissue]
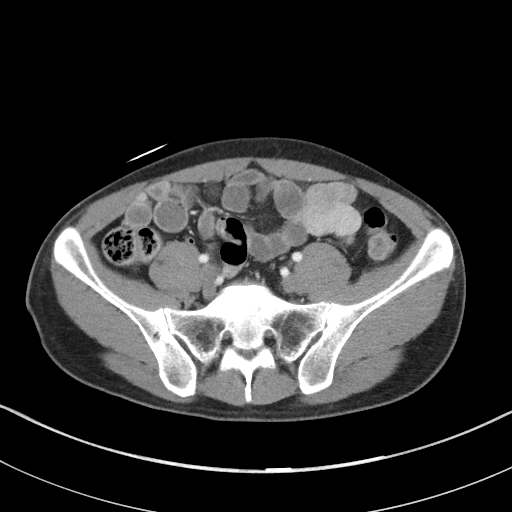
[im 41/93  soft-tissue]
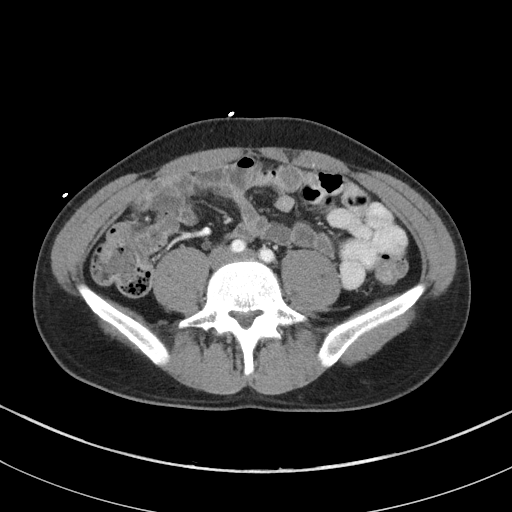
[im 48/93  soft-tissue]
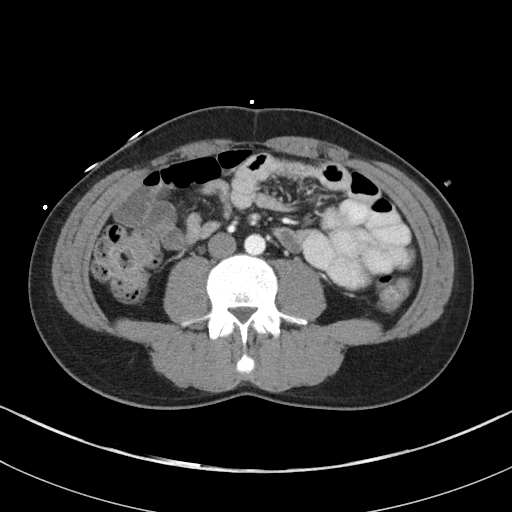
[im 52/93  soft-tissue]
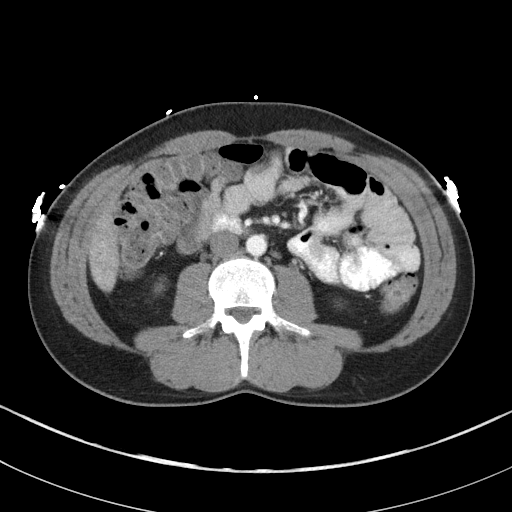
[im 59/93  soft-tissue]
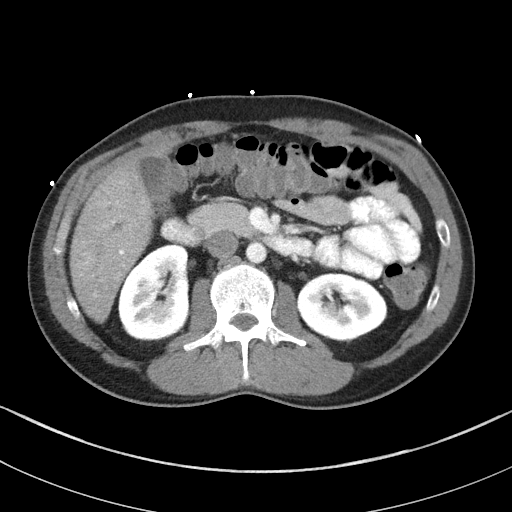
[im 59/93  bone]
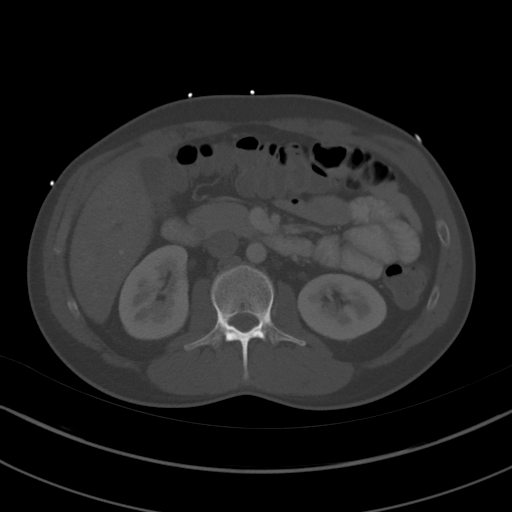
[im 67/93  soft-tissue]
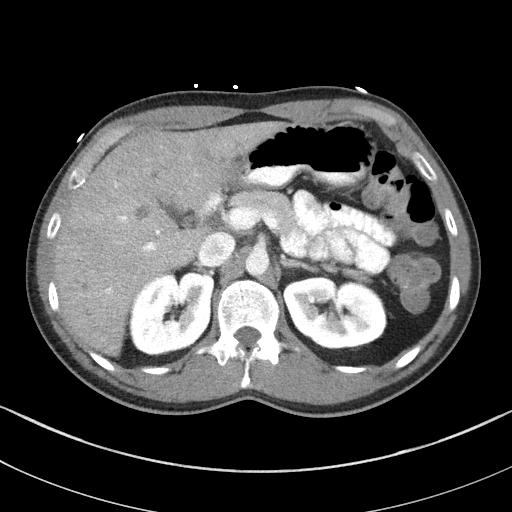
[im 74/93  soft-tissue]
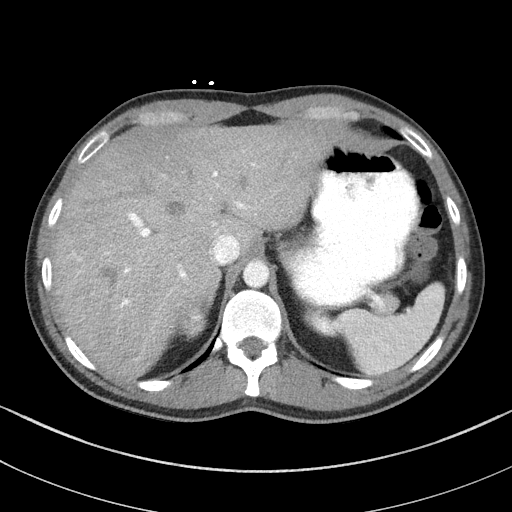
[im 81/93  soft-tissue]
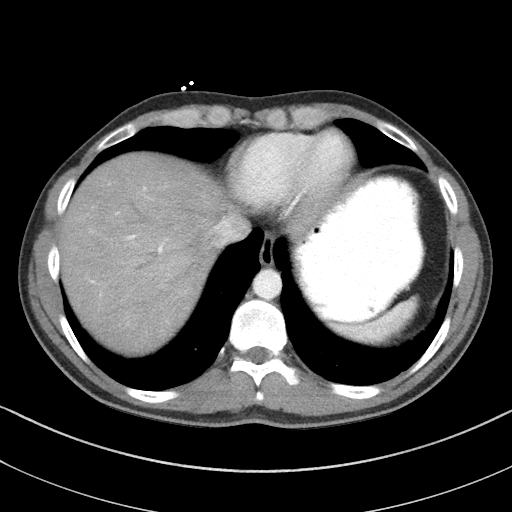
[im 89/93  soft-tissue]
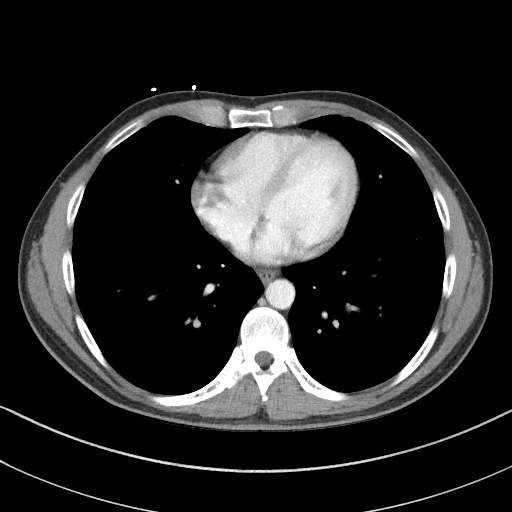

[Series 5: coronal st · coronal · 0.68mm/px · 3 of 77 slices shown]
[im 26/77  soft-tissue]
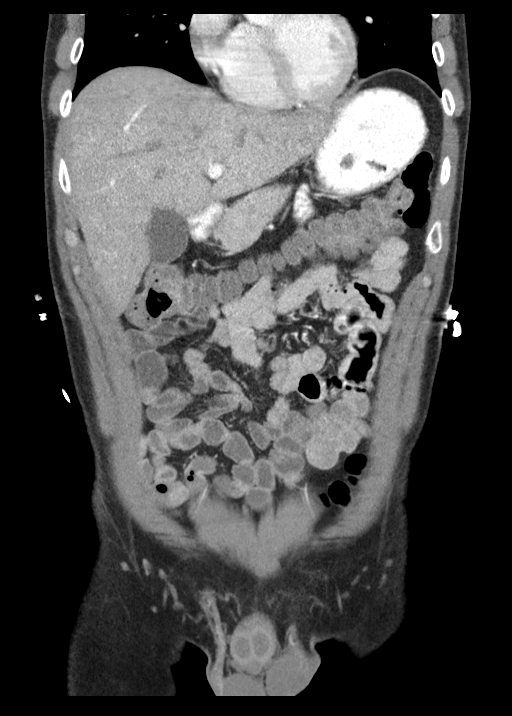
[im 34/77  soft-tissue]
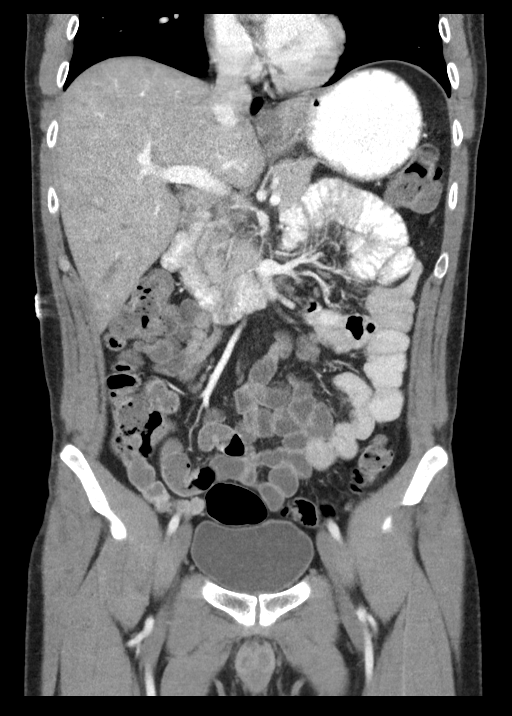
[im 43/77  soft-tissue]
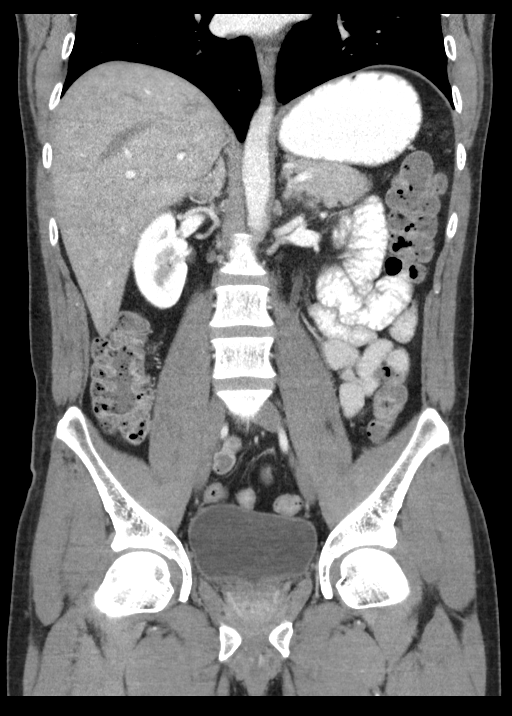

[16 of 46 positions shown; findings below may reference images not displayed]

FINDINGS: Lower chest: Lung bases are clear. No effusions. Heart is normal
size.

Hepatobiliary: No focal hepatic abnormality. Gallbladder
unremarkable.

Pancreas: No focal abnormality or ductal dilatation.

Spleen: No focal abnormality.  Normal size.

Adrenals/Urinary Tract: No adrenal abnormality. No focal renal
abnormality. No stones or hydronephrosis. Urinary bladder is
unremarkable.

Stomach/Bowel: Stomach, large and small bowel grossly unremarkable.
Appendix is normal.

Vascular/Lymphatic: No evidence of aneurysm or adenopathy.

Reproductive: No visible abnormality

Other: No free fluid or free air.

Musculoskeletal: No acute bony abnormality.
IMPRESSION: No acute findings in the abdomen or pelvis.

## 2016-09-23 ENCOUNTER — Other Ambulatory Visit: Payer: Self-pay

## 2016-09-23 DIAGNOSIS — G40901 Epilepsy, unspecified, not intractable, with status epilepticus: Secondary | ICD-10-CM

## 2016-09-23 MED ORDER — LEVETIRACETAM 500 MG PO TABS: 500.0000 mg | ORAL_TABLET | Freq: Two times a day (BID) | ORAL | 0 refills | Status: DC

## 2016-10-24 ENCOUNTER — Other Ambulatory Visit: Payer: Self-pay | Admitting: Infectious Diseases

## 2016-10-24 IMAGING — CR DG CHEST 2V
2 series · 2 of 2 positions shown · non-contrast
Comparison: 08/18/2015

CLINICAL DATA: Initial encounter for Pt has history of epilepsy,
states he had an epileptic episode in the shower and fell approx. 2
days ago. C/o pain to anterior and superior left ribs. H/o HTN.
Smoker.

EXAM:
CHEST  2 VIEW

[w chest pa]
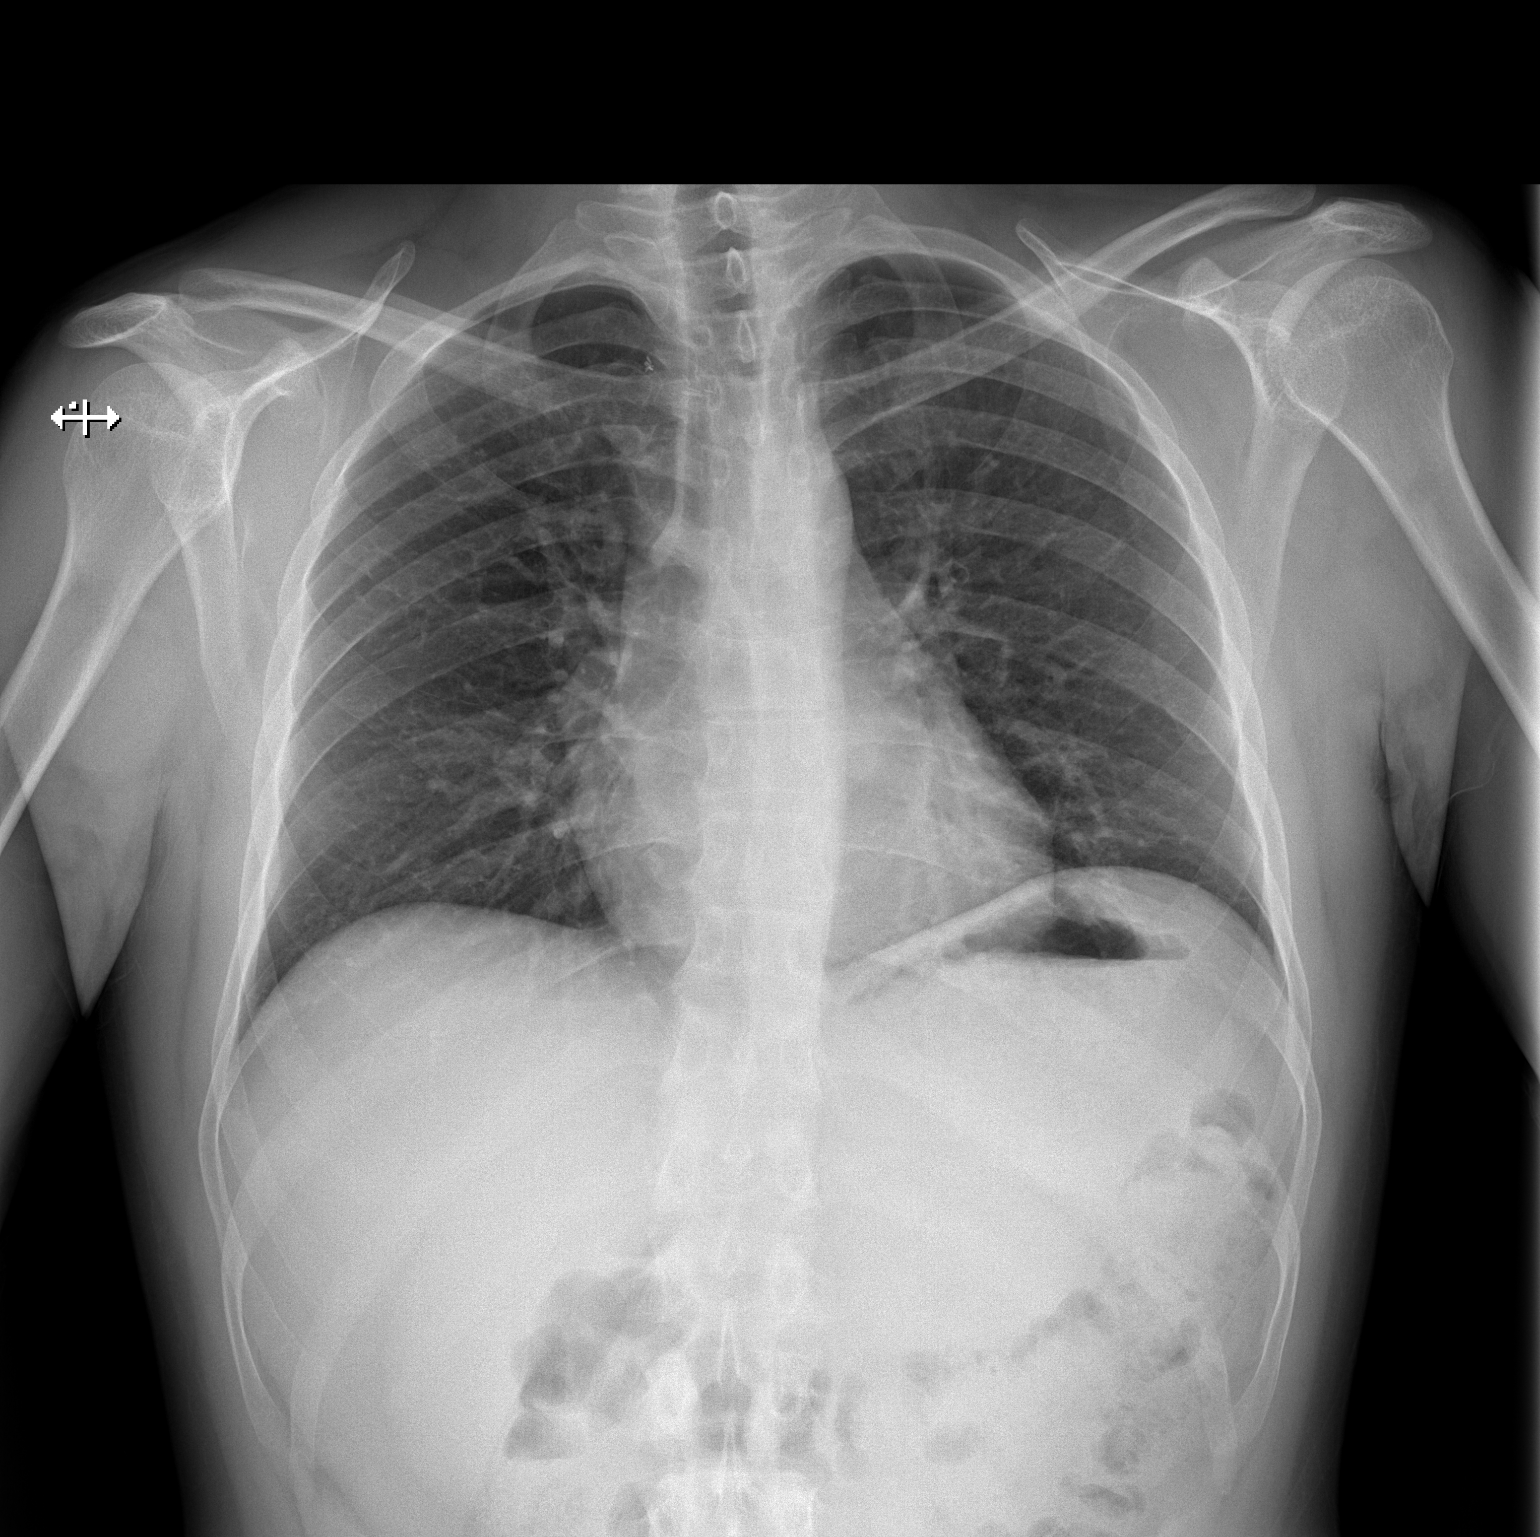

[w chest lat]
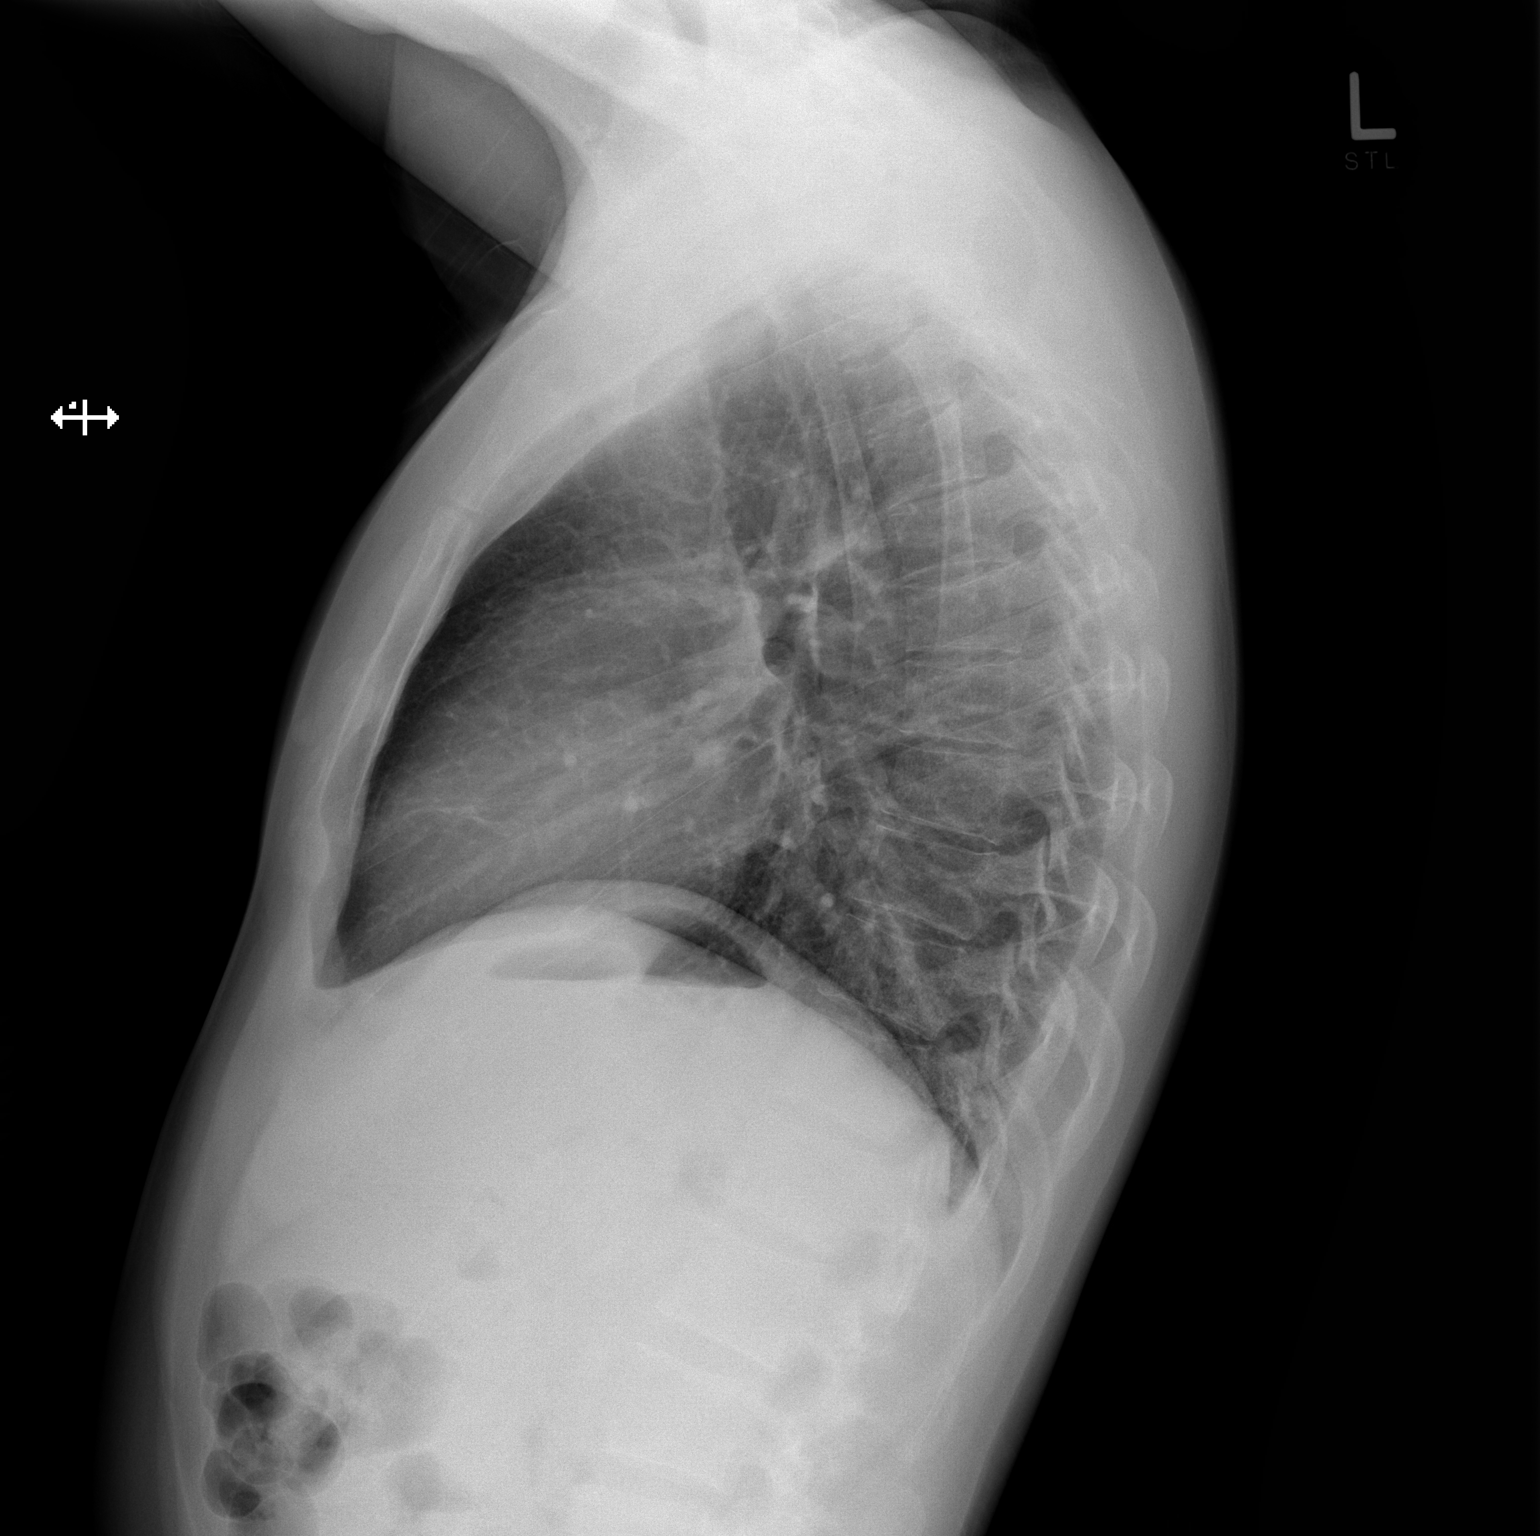

[2 of 2 positions shown; findings below may reference images not displayed]

FINDINGS: Midline trachea. Normal heart size and mediastinal contours. No
pleural effusion or pneumothorax. Clear lungs.
IMPRESSION: No active cardiopulmonary disease.

## 2016-10-26 ENCOUNTER — Other Ambulatory Visit: Payer: Self-pay

## 2016-10-26 DIAGNOSIS — G40901 Epilepsy, unspecified, not intractable, with status epilepticus: Secondary | ICD-10-CM

## 2016-10-26 MED ORDER — LEVETIRACETAM 500 MG PO TABS: 500.0000 mg | ORAL_TABLET | Freq: Two times a day (BID) | ORAL | 2 refills | Status: DC

## 2016-11-19 ENCOUNTER — Other Ambulatory Visit (INDEPENDENT_AMBULATORY_CARE_PROVIDER_SITE_OTHER): Payer: Self-pay

## 2016-11-19 DIAGNOSIS — Z113 Encounter for screening for infections with a predominantly sexual mode of transmission: Secondary | ICD-10-CM

## 2016-11-19 DIAGNOSIS — B20 Human immunodeficiency virus [HIV] disease: Secondary | ICD-10-CM

## 2016-11-20 LAB — RPR

## 2016-11-20 LAB — T-HELPER CELL (CD4) - (RCID CLINIC ONLY)
CD4 T CELL HELPER: 34 % (ref 33–55)
CD4 T Cell Abs: 780 /uL (ref 400–2700)

## 2016-11-20 LAB — URINE CYTOLOGY ANCILLARY ONLY
CHLAMYDIA, DNA PROBE: NEGATIVE
NEISSERIA GONORRHEA: NEGATIVE

## 2016-11-23 ENCOUNTER — Encounter: Payer: Self-pay | Admitting: Infectious Diseases

## 2016-11-23 LAB — HIV-1 RNA QUANT-NO REFLEX-BLD
HIV 1 RNA Quant: <20 copies/mL
HIV-1 RNA Quant, Log: <1.3 Log copies/mL

## 2016-11-26 ENCOUNTER — Ambulatory Visit: Payer: Self-pay

## 2016-11-26 NOTE — BH Specialist Note (Signed)
I was 5-10 minutes late due to bad traffic [facility] I checked the lobby at 9:07 and he had already left. Franne FortsKenny Irby Fails, LCSW

## 2016-12-07 ENCOUNTER — Ambulatory Visit: Payer: Self-pay | Admitting: Infectious Diseases

## 2016-12-25 ENCOUNTER — Other Ambulatory Visit: Payer: Self-pay | Admitting: Infectious Diseases

## 2016-12-25 DIAGNOSIS — B20 Human immunodeficiency virus [HIV] disease: Secondary | ICD-10-CM

## 2017-01-10 ENCOUNTER — Encounter (HOSPITAL_BASED_OUTPATIENT_CLINIC_OR_DEPARTMENT_OTHER): Payer: Self-pay | Admitting: Emergency Medicine

## 2017-01-10 ENCOUNTER — Emergency Department (HOSPITAL_BASED_OUTPATIENT_CLINIC_OR_DEPARTMENT_OTHER)
Admission: EM | Admit: 2017-01-10 | Discharge: 2017-01-10 | Disposition: A | Discharge status: Home / Self Care | Payer: Self-pay | Attending: Emergency Medicine | Admitting: Emergency Medicine

## 2017-01-10 DIAGNOSIS — Z79899 Other long term (current) drug therapy: Secondary | ICD-10-CM | POA: Insufficient documentation

## 2017-01-10 DIAGNOSIS — M65841 Other synovitis and tenosynovitis, right hand: Secondary | ICD-10-CM | POA: Insufficient documentation

## 2017-01-10 DIAGNOSIS — F17210 Nicotine dependence, cigarettes, uncomplicated: Secondary | ICD-10-CM | POA: Insufficient documentation

## 2017-01-10 DIAGNOSIS — M65842 Other synovitis and tenosynovitis, left hand: Secondary | ICD-10-CM | POA: Insufficient documentation

## 2017-01-10 DIAGNOSIS — I10 Essential (primary) hypertension: Secondary | ICD-10-CM | POA: Insufficient documentation

## 2017-01-10 DIAGNOSIS — Z21 Asymptomatic human immunodeficiency virus [HIV] infection status: Secondary | ICD-10-CM | POA: Insufficient documentation

## 2017-01-10 DIAGNOSIS — M659 Synovitis and tenosynovitis, unspecified: Secondary | ICD-10-CM

## 2017-01-10 NOTE — Discharge Instructions (Signed)
Take 4 over the counter ibuprofen tablets 3 times a day or 2 over-the-counter naproxen tablets twice a day for pain. Also take tylenol 1000mg(2 extra strength) four times a day.    

## 2017-01-10 NOTE — ED Provider Notes (Signed)
[facility] Note   CSN: [id number] Arrival date & time: 01/10/17  0857     History   Chief Complaint Chief Complaint  Patient presents with  . Hand Pain    HPI Javier Hill is a 36 y.o. male.  36 yo M with a chief complaint of bilateral wrist pain. Going on for at least a month. Worse after day of working. Also worse right when he wakes up the morning. Feels that they're getting weak secondary to the pain. Denies injury. Denies fevers or chills. Denies neck or back pain.   The history is provided by the patient.  Hand Pain  This is a new problem. The current episode started more than 1 week ago. The problem occurs constantly. The problem has been gradually worsening. Pertinent negatives include no chest pain, no abdominal pain, no headaches and no shortness of breath. The symptoms are aggravated by bending and twisting. Nothing relieves the symptoms. He has tried acetaminophen for the symptoms. The treatment provided mild relief.    Past Medical History:  Diagnosis Date  . Anxiety epi  . Epilepsy (HCC)   . HIV (human immunodeficiency virus infection) (HCC)   . Hypertension   . Migraines 2016  . Panic attack   . Seizure [facility])    last seizure 09/05/2014    Patient Active Problem List   Diagnosis Date Noted  . Epigastric pain   . Hematemesis with nausea   . GI bleed 07/12/2016  . 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 Status epilepticus (HCC) 02/26/2015  . Herpes genitalis in men 10/23/2014  . Anxiety disorder due to general medical condition with panic attack 08/02/2014  . Essential hypertension 08/02/2014  . Esophageal reflux 08/02/2014  . Right shoulder pain 08/02/2014  . Anxiety state 08/02/2014  . Seizure (HCC) 07/17/2014  . Acute respiratory failure with hypoxia (HCC) 07/17/2014  . HIV (human immunodeficiency virus infection) (HCC) 07/17/2014    Past Surgical History:  Procedure Laterality Date  . ORIF CLAVICULAR FRACTURE          Home Medications    Prior to Admission medications   Medication Sig Start Date End Date Taking? Authorizing Provider  diphenhydrAMINE (BENADRYL) 25 MG tablet Take 25 mg by mouth every 6 (six) hours as needed for itching.   Yes Historical Provider, MD  emtricitabine-tenofovir (TRUVADA) 200-300 MG tablet Take 1 tablet by mouth daily. 07/21/16  Yes Gardiner Barefoot, MD  ibuprofen (ADVIL,MOTRIN) 200 MG tablet Take 200 mg by mouth every 6 (six) hours as needed for moderate pain.   Yes Historical Provider, MD  ISENTRESS 400 MG tablet TAKE 1 TABLET BY MOUTH TWICE DAILY 12/28/16  Yes Ginnie Smart, MD  levETIRAcetam (KEPPRA) 500 MG tablet Take 1 tablet (500 mg total) by mouth 2 (two) times daily. 10/26/16  Yes Henrietta Hoover, NP  lisinopril (PRINIVIL,ZESTRIL) 20 MG tablet Take 1 tablet (20 mg total) by mouth daily. 08/13/16  Yes Henrietta Hoover, NP  omeprazole (PRILOSEC) 40 MG capsule Take 1 capsule (40 mg total) by mouth daily. 08/13/16  Yes Henrietta Hoover, NP  oxymetazoline (AFRIN) 0.05 % nasal spray Place 1 spray into both nostrils 2 (two) times daily as needed for congestion.   Yes Historical Provider, MD  phenytoin (DILANTIN) 100 MG ER capsule Take 1 capsule (100 mg total) by mouth 3 (three) times daily. 08/13/16  Yes Henrietta Hoover, NP  famotidine (PEPCID) 20 MG tablet Take 1 tablet (20 mg total) by mouth  2 (two) times daily. 08/13/16   Henrietta Hoover, NP  TRUVADA 200-300 MG tablet TAKE 1 TABLET BY MOUTH EVERY DAY 12/28/16   Ginnie Smart, MD    Family History Family History  Problem Relation Age of Onset  . Adopted: Yes    Social History Social History  Substance Use Topics  . Smoking status: Current Every Day Smoker    Packs/day: 0.10    Types: Cigarettes  . Smokeless tobacco: Never Used     Comment: 2-3 a day   . Alcohol use No     Comment: occasional      Allergies   Caffeine; Ciprofloxacin; Penicillins; Sulfamethoxazole-trimethoprim; Tramadol;  Acyclovir and related; Haldol [haloperidol lactate]; Librium [chlordiazepoxide]; Aloprim [allopurinol]; Ambien [zolpidem tartrate]; Bactrim [sulfamethoxazole-trimethoprim]; Other; Pneumovax [pneumococcal polysaccharide vaccine]; Prevnar [pneumococcal 13-val conj vacc]; Watermelon flavor; and Zoloft [sertraline hcl]   Review of Systems Review of Systems  Constitutional: Negative for chills and fever.  HENT: Negative for congestion and facial swelling.   Eyes: Negative for discharge and visual disturbance.  Respiratory: Negative for shortness of breath.   Cardiovascular: Negative for chest pain and palpitations.  Gastrointestinal: Negative for abdominal pain, diarrhea and vomiting.  Musculoskeletal: Positive for arthralgias and myalgias.  Skin: Negative for color change and rash.  Neurological: Negative for tremors, syncope and headaches.  Psychiatric/Behavioral: Negative for confusion and dysphoric mood.     Physical Exam Updated Vital Signs BP 119/82 (BP Location: Right Arm)   Pulse 82   Temp 98.1 F (36.7 C) (Oral)   Resp 16   Ht  (1.651 m)   Wt 140 lb (63.5 kg)   SpO2 100%   BMI 23.30 kg/m   Physical Exam  Constitutional: He is oriented to person, place, and time. He appears well-developed and well-nourished.  HENT:  Head: Normocephalic and atraumatic.  Eyes: EOM are normal. Pupils are equal, round, and reactive to light.  Neck: Normal range of motion. Neck supple. No JVD present.  Cardiovascular: Normal rate and regular rhythm.  Exam reveals no gallop and no friction rub.   No murmur heard. Pulmonary/Chest: No respiratory distress. He has no wheezes.  Abdominal: He exhibits no distension and no mass. There is no tenderness. There is no rebound and no guarding.  Musculoskeletal: Normal range of motion. He exhibits tenderness.  Pulse motor and sensation is intact to bilateral upper extremities. Patient has pain worse along bilateral brachoradialis muscles.  +  finklestein.  + tinnel at median canal.   Neurological: He is alert and oriented to person, place, and time.  Skin: No rash noted. No pallor.  Psychiatric: He has a normal mood and affect. His behavior is normal.  Nursing note and vitals reviewed.    ED Treatments / Results  Labs (all labs ordered are listed, but only abnormal results are displayed) Labs Reviewed - No data to display  EKG  EKG Interpretation None       Radiology No results found.  Procedures Procedures (including critical care time)  Medications Ordered in ED Medications - No data to display   Initial Impression / Assessment and Plan / ED Course  I have reviewed the triage vital signs and the nursing notes.  Pertinent labs & imaging results that were available during my care of the patient were reviewed by me and considered in my medical decision making (see chart for details).     36 yo M with a cc of bilateral hand pain.  This been going on for a  month. Patient has a positive Finkelstein test bilaterally. I suspect that he has tenosynovitis. He also has some symptoms consistent with carpal tunnel syndrome. Will treat conservatively. PCP follow-up.  9:51 AM:  I have discussed the diagnosis/risks/treatment options with the patient and family and believe the pt to be eligible for discharge home to follow-up with PCP. We also discussed returning to the ED immediately if new or worsening sx occur. We discussed the sx which are most concerning (e.g., sudden worsening pain, fever, inability to tolerate by mouth) that necessitate immediate return. Medications administered to the patient during their visit and any new prescriptions provided to the patient are listed below.  Medications given during this visit Medications - No data to display   The patient appears reasonably screen and/or stabilized for discharge and I doubt any other medical condition or other [facility] requiring further screening, evaluation, or  treatment in the ED at this time prior to discharge.    Final Clinical Impressions(s) / ED Diagnoses   Final diagnoses:  Tenosynovitis of forearm    New Prescriptions New Prescriptions   No medications on file     Melene Plan, DO 01/10/17 [id number]

## 2017-01-10 NOTE — ED Triage Notes (Signed)
Pain and numbness to hands x 1 month, worse after working a shift.

## 2017-01-14 ENCOUNTER — Ambulatory Visit: Payer: Self-pay | Admitting: Family Medicine

## 2017-01-19 ENCOUNTER — Encounter: Payer: Self-pay | Admitting: Family Medicine

## 2017-01-19 ENCOUNTER — Other Ambulatory Visit: Payer: Self-pay | Admitting: Infectious Diseases

## 2017-01-19 ENCOUNTER — Ambulatory Visit (INDEPENDENT_AMBULATORY_CARE_PROVIDER_SITE_OTHER): Payer: Self-pay | Admitting: Family Medicine

## 2017-01-19 VITALS — BP 122/70 | HR 84 | Temp 98.2°F | Resp 16 | Ht 65.0 in | Wt 137.0 lb

## 2017-01-19 DIAGNOSIS — Z8669 Personal history of other diseases of the nervous system and sense organs: Secondary | ICD-10-CM

## 2017-01-19 DIAGNOSIS — I10 Essential (primary) hypertension: Secondary | ICD-10-CM

## 2017-01-19 DIAGNOSIS — B20 Human immunodeficiency virus [HIV] disease: Secondary | ICD-10-CM

## 2017-01-19 DIAGNOSIS — G40901 Epilepsy, unspecified, not intractable, with status epilepticus: Secondary | ICD-10-CM

## 2017-01-19 DIAGNOSIS — G629 Polyneuropathy, unspecified: Secondary | ICD-10-CM

## 2017-01-19 LAB — COMPLETE METABOLIC PANEL WITH GFR
AG Ratio: 1.6 Ratio (ref 1.0–2.5)
ALT: 25 U/L (ref 9–46)
AST: 19 U/L (ref 10–40)
Albumin: 4.3 g/dL (ref 3.6–5.1)
Alkaline Phosphatase: 99 U/L (ref 40–115)
BILIRUBIN TOTAL: 0.4 mg/dL (ref 0.2–1.2)
BUN/Creatinine Ratio: 13.6 Ratio (ref 6–22)
BUN: 11 mg/dL (ref 7–25)
CO2: 25 mmol/L (ref 20–31)
Calcium: 9.3 mg/dL (ref 8.6–10.3)
Chloride: 103 mmol/L (ref 98–110)
Creat: 0.81 mg/dL (ref 0.60–1.35)
GFR, Est African American: >89 mL/min (ref >=60)
GLOBULIN: 2.7 g/dL (ref 1.9–3.7)
GLUCOSE: 82 mg/dL (ref 65–99)
Potassium: 4.1 mmol/L (ref 3.5–5.3)
Sodium: 137 mmol/L (ref 135–146)
TOTAL PROTEIN: 7 g/dL (ref 6.1–8.1)

## 2017-01-19 LAB — VITAMIN B12: Vitamin B-12: 452 pg/mL (ref 200–1100)

## 2017-01-19 LAB — CBC WITH DIFFERENTIAL/PLATELET
BASOS PCT: 0 %
Basophils Absolute: 0 cells/uL (ref 0–200)
EOS ABS: 120 {cells}/uL (ref 15–500)
Eosinophils Relative: 2 %
HCT: 45.5 % (ref 38.5–50.0)
Hemoglobin: 14.9 g/dL (ref 13.2–17.1)
Lymphocytes Relative: 41 %
Lymphs Abs: 2460 cells/uL (ref 850–3900)
MCH: 31 pg (ref 27.0–33.0)
MCHC: 32.7 g/dL (ref 32.0–36.0)
MCV: 94.8 fL (ref 80.0–100.0)
MONO ABS: 540 {cells}/uL (ref 200–950)
MONOS PCT: 9 %
MPV: 9.8 fL (ref 7.5–12.5)
NEUTROS ABS: 2880 {cells}/uL (ref 1500–7800)
Neutrophils Relative %: 48 %
PLATELETS: 261 10*3/uL (ref 140–400)
RBC: 4.8 MIL/uL (ref 4.20–5.80)
RDW: 14.4 % (ref 11.0–15.0)
WBC: 6 10*3/uL (ref 3.8–10.8)

## 2017-01-19 LAB — POCT GLYCOSYLATED HEMOGLOBIN (HGB A1C): HEMOGLOBIN A1C: 5.3

## 2017-01-19 MED ORDER — LEVETIRACETAM 500 MG PO TABS: 500.0000 mg | ORAL_TABLET | Freq: Two times a day (BID) | ORAL | 2 refills | Status: AC

## 2017-01-19 MED ORDER — PHENYTOIN SODIUM EXTENDED 100 MG PO CAPS: 100.0000 mg | ORAL_CAPSULE | Freq: Three times a day (TID) | ORAL | 3 refills | Status: AC

## 2017-01-19 MED ORDER — GABAPENTIN 300 MG PO CAPS: 300.0000 mg | ORAL_CAPSULE | Freq: Three times a day (TID) | ORAL | 3 refills | Status: DC

## 2017-01-19 MED ORDER — GABAPENTIN 300 MG PO CAPS: 300.0000 mg | ORAL_CAPSULE | Freq: Three times a day (TID) | ORAL | 3 refills | Status: AC

## 2017-01-19 MED ORDER — LEVETIRACETAM 500 MG PO TABS: 500.0000 mg | ORAL_TABLET | Freq: Two times a day (BID) | ORAL | 2 refills | Status: DC

## 2017-01-19 MED ORDER — LISINOPRIL 20 MG PO TABS: 20.0000 mg | ORAL_TABLET | Freq: Every day | ORAL | 3 refills | Status: AC

## 2017-01-19 MED ORDER — FAMOTIDINE 20 MG PO TABS: 20.0000 mg | ORAL_TABLET | Freq: Two times a day (BID) | ORAL | 3 refills | Status: DC

## 2017-01-19 MED ORDER — PHENYTOIN SODIUM EXTENDED 100 MG PO CAPS: 100.0000 mg | ORAL_CAPSULE | Freq: Three times a day (TID) | ORAL | 3 refills | Status: DC

## 2017-01-19 NOTE — Patient Instructions (Signed)
For numbness and bilateral arm pain start Gabapentin 300 mg, three times daily as needed.  You should also purchase some wrist splints to wear while sleeping to promote arm rest.    Wrist Splint A wrist splint holds your wrist in a set position so that it does not move. A wrist splint supports your wrist but is flexible. It can be removed or loosened. You may need a wrist splint if you hurt your wrist or have swelling in your wrist. A wrist splint can:  Support your wrist.  Protect a wrist injury.  Prevent another wrist injury.  Keep your wrist from moving.  Reduce pain.  Help your wrist heal. What are the risks? If you wear your splint too tight or have a lot of swelling, it can reduce the blood supply to your wrist or hand. This can cause a condition called compartment syndrome. It can be dangerous and cause lasting damage. Symptoms are:  Pain in your wrist that is getting worse.  Tingling and numbness.  Changes in skin color (paleness or a bluish color).  Cold fingers. Other risks of wearing a splint may be:  Skin irritation that can cause:  Itching.  Rash.  Skin sores.  Skin infection.  Wrist stiffness. This can happen if you have worn a splint for a long time. How to use your wrist splint Your wrist splint should be tight enough to support your wrist. It should not cut off your blood supply. Your doctor will tell you how to wear your wrist splint and for how long.  Follow all your doctor's directions.  Take medicine only as told by your doctor.  Keep your wrist above the level of your heart (elevated) while resting.  Ice may help reduce pain and swelling.  Place ice in a plastic bag.  Place a towel between your splint and the bag.  Leave the ice on for 20 minutes, 2-3 times a day.  Do not get your splint wet.  Do not push objects under your splint to scratch your skin.  Loosen your splint if it feels too tight. Talk to your doctor if you have  questions about how tight to wear the splint.  Keep all follow-up visits as told by your doctor. This is important. Get help if:  You have wrist pain or swelling that does not go away.  The skin around or under your splint becomes red, itchy, or moist.  You have chills or fever.  Your splint feels too tight or too loose.  Your splint breaks. Get help right away if: You have:  Pain in your wrist that is getting worse.  Tingling and numbness.  Changes in skin color (paleness or a bluish color).  Cold fingers. This information is not intended to replace advice given to you by your health care provider. Make sure you discuss any questions you have with your health care provider. Document Released: 03/02/2008 Document Revised: 02/20/2016 Document Reviewed: 12/26/2013 Elsevier Interactive Patient Education  2017 [employer]  Carpal Tunnel Syndrome Carpal tunnel syndrome is a condition that causes pain in your hand and arm. The carpal tunnel is a narrow area that is on the palm side of your wrist. Repeated wrist motion or certain diseases may cause swelling in the tunnel. This swelling can pinch the main nerve in the wrist (median nerve). Follow these instructions at home: If you have a splint:   Wear it as told by your doctor. Remove it only as told by your  doctor.  Loosen the splint if your fingers:  Become numb and tingle.  Turn blue and cold.  Keep the splint clean and dry. General instructions   Take over-the-counter and prescription medicines only as told by your doctor.  Rest your wrist from any activity that may be causing your pain. If needed, talk to your employer about changes that can be made in your work, such as getting a wrist pad to use while typing.  If directed, apply ice to the painful area:  Put ice in a plastic bag.  Place a towel between your skin and the bag.  Leave the ice on for 20 minutes, 2-3 times per day.  Keep all follow-up visits as  told by your doctor. This is important.  Do any exercises as told by your doctor, physical therapist, or occupational therapist. Contact a doctor if:  You have new symptoms.  Medicine does not help your pain.  Your symptoms get worse. This information is not intended to replace advice given to you by your health care provider. Make sure you discuss any questions you have with your health care provider. Document Released: 09/03/2011 Document Revised: 02/20/2016 Document Reviewed: 01/30/2015 Elsevier Interactive Patient Education  2017 [employer].

## 2017-01-19 NOTE — Progress Notes (Signed)
Patient ID: Javier Hill, male    DOB: [birth year], 36 y.o.   MRN: [id number]  PCP: Joaquin Courts, FNP  Chief Complaint  Patient presents with  . Numbness    hands and fingers    Subjective:  HPI  Javier Hill is a 36 y.o. male presents for evaluation of bilateral hand and finger numbness x 1 month. He was recently seen and evaluated for a similar complaint 01/10/2017 at [facility]. He was advised to treat with antiinflammatories as acetaminophen only provided minimal relief . He reports initial pain was in left forearm and subsequently pain is now bilaterally and involves persistent numbness and tingling of both hands and forearm. Pain is worst with flexion of arm and wrist, wrist pain exacerbated by extension and flexion of mid 3 fingers. He has some pain bilateral elbows. Denies redness or swelling, recent or past injuries involving wrists or forearms.   Social History   Social History  . Marital status: Single    Spouse name: N/A  . Number of children: N/A  . Years of education: N/A   Occupational History  . Not on file.   Social History Main Topics  . Smoking status: Current Every Day Smoker    Packs/day: 0.10    Types: Cigarettes  . Smokeless tobacco: Never Used     Comment: 2-3 a day   . Alcohol use No     Comment: occasional   . Drug use: Yes    Types: Marijuana     Comment: COUPLE TIMES A WEEK   . Sexual activity: Not on file   Other Topics Concern  . Not on file   Social History Narrative  . No narrative on file    Family History  Problem Relation Age of Onset  . Adopted: Yes   Review of Systems See HPI  Patient Active Problem List   Diagnosis Date Noted  . Epigastric pain   . Hematemesis with nausea   . GI bleed 07/12/2016  . 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 Status epilepticus (HCC) 02/26/2015  . Herpes genitalis in men 10/23/2014  . Anxiety disorder due to general medical condition with panic attack 08/02/2014  .  Essential hypertension 08/02/2014  . Esophageal reflux 08/02/2014  . Right shoulder pain 08/02/2014  . Anxiety state 08/02/2014  . Seizure (HCC) 07/17/2014  . Acute respiratory failure with hypoxia (HCC) 07/17/2014  . HIV (human immunodeficiency virus infection) (HCC) 07/17/2014      Prior to Admission medications   Medication Sig Start Date End Date Taking? Authorizing Provider  emtricitabine-tenofovir (TRUVADA) 200-300 MG tablet Take 1 tablet by mouth daily. 07/21/16  Yes Gardiner Barefoot, MD  ISENTRESS 400 MG tablet TAKE 1 TABLET BY MOUTH TWICE DAILY 12/28/16  Yes Ginnie Smart, MD  levETIRAcetam (KEPPRA) 500 MG tablet Take 1 tablet (500 mg total) by mouth 2 (two) times daily. 10/26/16  Yes Henrietta Hoover, NP  lisinopril (PRINIVIL,ZESTRIL) 20 MG tablet Take 1 tablet (20 mg total) by mouth daily. 08/13/16  Yes Henrietta Hoover, NP  oxymetazoline (AFRIN) 0.05 % nasal spray Place 1 spray into both nostrils 2 (two) times daily as needed for congestion.   Yes Historical Provider, MD  phenytoin (DILANTIN) 100 MG ER capsule Take 1 capsule (100 mg total) by mouth 3 (three) times daily. 08/13/16  Yes Henrietta Hoover, NP  diphenhydrAMINE (BENADRYL) 25 MG tablet Take 25 mg by mouth every 6 (six) hours as needed for itching.  Historical Provider, MD  famotidine (PEPCID) 20 MG tablet Take 1 tablet (20 mg total) by mouth 2 (two) times daily. Patient not taking: Reported on 01/19/2017 08/13/16   Henrietta Hoover, NP  ibuprofen (ADVIL,MOTRIN) 200 MG tablet Take 200 mg by mouth every 6 (six) hours as needed for moderate pain.    Historical Provider, MD  omeprazole (PRILOSEC) 40 MG capsule Take 1 capsule (40 mg total) by mouth daily. Patient not taking: Reported on 01/19/2017 08/13/16   Henrietta Hoover, NP  TRUVADA 200-300 MG tablet TAKE 1 TABLET BY MOUTH EVERY DAY Patient not taking: Reported on 01/19/2017 12/28/16   Ginnie Smart, MD    Past Medical, Surgical Family and Social History  reviewed and updated.    Objective:   Today's Vitals   01/19/17 1323  BP: 122/70  Pulse: 84  Resp: 16  Temp: 98.2 F (36.8 C)  TempSrc: Oral  Weight: 137 lb (62.1 kg)  Height:  (1.651 m)    Wt Readings from Last 3 Encounters:  01/19/17 137 lb (62.1 kg)  01/10/17 140 lb (63.5 kg)  08/13/16 137 lb (62.1 kg)   Physical Exam  Constitutional: He is oriented to person, place, and time. He appears well-developed and well-nourished.  Cardiovascular: Normal rate, regular rhythm, normal heart sounds and intact distal pulses.   Musculoskeletal:       Right elbow: He exhibits no swelling and no deformity. Tenderness found. No radial head, no medial epicondyle, no lateral epicondyle and no olecranon process tenderness noted.       Left elbow: He exhibits no swelling and no effusion. Tenderness found.       Right forearm: He exhibits tenderness. He exhibits no bony tenderness, no swelling, no edema and no deformity.       Left forearm: He exhibits tenderness. He exhibits no bony tenderness, no swelling, no edema and no laceration.  Positive Phalen's test Negative Fink[place]ien  Neurological: He is alert and oriented to person, place, and time. He has normal reflexes. Coordination normal.  Psychiatric: He has a normal mood and affect. His speech is normal and behavior is normal. Judgment and thought content normal.     Assessment & Plan:  1. Neuropathy - COMPLETE METABOLIC PANEL WITH GFR - Thyroid Panel With TSH - Vitamin B12 - POCT glycosylated hemoglobin (Hb A1C)  2. Hx of tonic-clonic seizures - Phenytoin level, total - CBC with Differential - levETIRAcetam (KEPPRA) 500 MG tablet; Take 1 tablet (500 mg total) by mouth 2 (two) times daily.  Dispense: 60 tablet; Refill: 2 - phenytoin (DILANTIN) 100 MG ER capsule; Take 1 capsule (100 mg total) by mouth 3 (three) times daily.  Dispense: 90 capsule; Refill: 3  3. Essential hypertension -Lisinopril 20 mg, daily.   4. HIV (human  immunodeficiency virus infection) (HCC)   Plan:  Rule out metabolic cause for neuropathy. Checking TSH, B12, A1C, and CMP Start Neurontin 300 mg, 3 times daily as need for neuropathy. Refilled famotidine 20 mg twice daily, Lisinopril 20 mg daily, Levetiracetam 500 mg, 1 tablet, two times daily, phenytoin (Dilantin) 100 mg ER, 3 times daily.    RTC: 2 weeks for neuropathy follow-up.  Godfrey Pick. Tiburcio Pea, MSN, [facility] [address] [place], [place] [address] [phone]

## 2017-01-20 LAB — THYROID PANEL WITH TSH
FREE THYROXINE INDEX: 2 (ref 1.4–3.8)
T3 UPTAKE: 31 % (ref 22–35)
T4, Total: 6.6 ug/dL (ref 4.5–12.0)
TSH: 0.59 m[IU]/L (ref 0.40–4.50)

## 2017-01-20 LAB — PHENYTOIN LEVEL, TOTAL

## 2017-02-02 ENCOUNTER — Encounter: Payer: Self-pay | Admitting: Family Medicine

## 2017-02-02 ENCOUNTER — Ambulatory Visit (INDEPENDENT_AMBULATORY_CARE_PROVIDER_SITE_OTHER): Payer: Self-pay | Admitting: Family Medicine

## 2017-02-02 VITALS — BP 114/66 | HR 68 | Temp 98.1°F | Resp 16 | Ht 65.0 in | Wt 135.0 lb

## 2017-02-02 DIAGNOSIS — F410 Panic disorder [episodic paroxysmal anxiety] without agoraphobia: Secondary | ICD-10-CM

## 2017-02-02 DIAGNOSIS — G629 Polyneuropathy, unspecified: Secondary | ICD-10-CM

## 2017-02-02 DIAGNOSIS — F4310 Post-traumatic stress disorder, unspecified: Secondary | ICD-10-CM

## 2017-02-02 MED ORDER — SUCRALFATE 1 GM/10ML PO SUSP: 1.0000 g | Freq: Three times a day (TID) | ORAL | 0 refills | Status: DC

## 2017-02-02 MED ORDER — CYCLOBENZAPRINE HCL 10 MG PO TABS: 10.0000 mg | ORAL_TABLET | Freq: Three times a day (TID) | ORAL | 0 refills | Status: DC | PRN

## 2017-02-02 MED ORDER — HYDROXYZINE PAMOATE 100 MG PO CAPS: 100.0000 mg | ORAL_CAPSULE | Freq: Three times a day (TID) | ORAL | 1 refills | Status: DC | PRN

## 2017-02-02 MED ORDER — LIDOCAINE 5 % EX PTCH: 1.0000 | MEDICATED_PATCH | CUTANEOUS | 0 refills | Status: AC

## 2017-02-02 MED ORDER — CYCLOBENZAPRINE HCL 10 MG PO TABS: 10.0000 mg | ORAL_TABLET | Freq: Three times a day (TID) | ORAL | 0 refills | Status: AC | PRN

## 2017-02-02 MED ORDER — OMEPRAZOLE 40 MG PO CPDR: 40.0000 mg | DELAYED_RELEASE_CAPSULE | Freq: Every day | ORAL | 3 refills | Status: AC

## 2017-02-02 MED ORDER — DULOXETINE HCL 30 MG PO CPEP: 30.0000 mg | ORAL_CAPSULE | Freq: Two times a day (BID) | ORAL | 1 refills | Status: DC

## 2017-02-02 MED ORDER — SUCRALFATE 1 GM/10ML PO SUSP: 1.0000 g | Freq: Three times a day (TID) | ORAL | 0 refills | Status: AC

## 2017-02-02 MED ORDER — HYDROXYZINE PAMOATE 100 MG PO CAPS: 100.0000 mg | ORAL_CAPSULE | Freq: Three times a day (TID) | ORAL | 1 refills | Status: AC | PRN

## 2017-02-02 MED FILL — OMEPRAZOLE DR 40 MG CAPSULE: 40 | 30 days supply | Qty: 30 | Fill #0

## 2017-02-02 MED FILL — CARAFATE 1 GM/10 ML SUSP: 1 | 10 days supply | Qty: 420 | Fill #0

## 2017-02-02 MED FILL — CYCLOBENZAPRINE 10 MG TAB: 10 | 10 days supply | Qty: 30 | Fill #0

## 2017-02-02 MED FILL — DULoxetine HCL 30 MG CPEP: 30 | 30 days supply | Qty: 60 | Fill #0
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
## 2017-02-02 NOTE — Patient Instructions (Signed)
For back pain:  -Start cyclobenzaprine 10 mg up to 3 times daily  -apply lidocaine patches as directed   For anxiety and neuropathy  -Start Cymbalta 30 mg twice daily  -Take Hydroxyzine 100 mg 3 times daily as needed for anxiety   For abdominal ulcer  -I am adding Carafate 3 time daily and continue omeprazole 40 mg daily.    Generalized Anxiety Disorder, Adult Generalized anxiety disorder (GAD) is a mental health disorder. People with this condition constantly worry about everyday events. Unlike normal anxiety, worry related to GAD is not triggered by a specific event. These worries also do not fade or get better with time. GAD interferes with life functions, including relationships, work, and school. GAD can vary from mild to severe. People with severe GAD can have intense waves of anxiety with physical symptoms (panic attacks). What are the causes? The exact cause of GAD is not known. What increases the risk? This condition is more likely to develop in:  Women.  People who have a family history of anxiety disorders.  People who are very shy.  People who experience very stressful life events, such as the death of a loved one.  People who have a very stressful family environment. What are the signs or symptoms? People with GAD often worry excessively about many things in their lives, such as their health and family. They may also be overly concerned about:  Doing well at work.  Being on time.  Natural disasters.  Friendships. Physical symptoms of GAD include:  Fatigue.  Muscle tension or having muscle twitches.  Trembling or feeling shaky.  Being easily startled.  Feeling like your heart is pounding or racing.  Feeling out of breath or like you cannot take a deep breath.  Having trouble falling asleep or staying asleep.  Sweating.  Nausea, diarrhea, or irritable bowel syndrome (IBS).  Headaches.  Trouble concentrating or remembering  facts.  Restlessness.  Irritability. How is this diagnosed? Your health care provider can diagnose GAD based on your symptoms and medical history. You will also have a physical exam. The health care provider will ask specific questions about your symptoms, including how severe they are, when they started, and if they come and go. Your health care provider may ask you about your use of alcohol or drugs, including prescription medicines. Your health care provider may refer you to a mental health specialist for further evaluation. Your health care provider will do a thorough examination and may perform additional tests to rule out other possible causes of your symptoms. To be diagnosed with GAD, a person must have anxiety that:  Is out of his or her control.  Affects several different aspects of his or her life, such as work and relationships.  Causes distress that makes him or her unable to take part in normal activities.  Includes at least three physical symptoms of GAD, such as restlessness, fatigue, trouble concentrating, irritability, muscle tension, or sleep problems. Before your health care provider can confirm a diagnosis of GAD, these symptoms must be present more days than they are not, and they must last for six months or longer. How is this treated? The following therapies are usually used to treat GAD:  Medicine. Antidepressant medicine is usually prescribed for long-term daily control. Antianxiety medicines may be added in severe cases, especially when panic attacks occur.  Talk therapy (psychotherapy). Certain types of talk therapy can be helpful in treating GAD by providing support, education, and guidance. Options include:  Cognitive behavioral therapy (CBT). People learn coping skills and techniques to ease their anxiety. They learn to identify unrealistic or negative thoughts and behaviors and to replace them with positive ones.  Acceptance and commitment therapy (ACT).  This treatment teaches people how to be mindful as a way to cope with unwanted thoughts and feelings.  Biofeedback. This process trains you to manage your body's response (physiological response) through breathing techniques and relaxation methods. You will work with a therapist while machines are used to monitor your physical symptoms.  Stress management techniques. These include yoga, meditation, and exercise. A mental health specialist can help determine which treatment is best for you. Some people see improvement with one type of therapy. However, other people require a combination of therapies. Follow these instructions at home:  Take over-the-counter and prescription medicines only as told by your health care provider.  Try to maintain a normal routine.  Try to anticipate stressful situations and allow extra time to manage them.  Practice any stress management or self-calming techniques as taught by your health care provider.  Do not punish yourself for setbacks or for not making progress.  Try to recognize your accomplishments, even if they are small.  Keep all follow-up visits as told by your health care provider. This is important. Contact a health care provider if:  Your symptoms do not get better.  Your symptoms get worse.  You have signs of depression, such as:  A persistently sad, cranky, or irritable mood.  Loss of enjoyment in activities that used to bring you joy.  Change in weight or eating.  Changes in sleeping habits.  Avoiding friends or family members.  Loss of energy for normal tasks.  Feelings of guilt or worthlessness. Get help right away if:  You have serious thoughts about hurting yourself or others. If you ever feel like you may hurt yourself or others, or have thoughts about taking your own life, get help right away. You can go to your nearest emergency department or call:  Your local emergency services (911 in the [place]  A suicide crisis  helpline, such as the National Suicide Prevention Lifeline at 530-714-50551-(612)301-4839. This is open 24 hours a day. Summary  Generalized anxiety disorder (GAD) is a mental health disorder that involves worry that is not triggered by a specific event.  People with GAD often worry excessively about many things in their lives, such as their health and family.  GAD may cause physical symptoms such as restlessness, trouble concentrating, sleep problems, frequent sweating, nausea, diarrhea, headaches, and trembling or muscle twitching.  A mental health specialist can help determine which treatment is best for you. Some people see improvement with one type of therapy. However, other people require a combination of therapies. This information is not intended to replace advice given to you by your health care provider. Make sure you discuss any questions you have with your health care provider. Document Released: 01/09/2013 Document Revised: 08/04/2016 Document Reviewed: 08/04/2016 Elsevier Interactive Patient Education  2017 [employer]

## 2017-02-15 ENCOUNTER — Ambulatory Visit: Payer: Self-pay | Admitting: Family Medicine

## 2017-02-16 ENCOUNTER — Encounter: Payer: Self-pay | Admitting: Infectious Diseases

## 2017-02-16 ENCOUNTER — Ambulatory Visit (INDEPENDENT_AMBULATORY_CARE_PROVIDER_SITE_OTHER): Payer: Self-pay | Admitting: Infectious Diseases

## 2017-02-16 VITALS — BP 124/85 | HR 120 | Temp 97.5°F | Wt 134.0 lb

## 2017-02-16 DIAGNOSIS — G40901 Epilepsy, unspecified, not intractable, with status epilepticus: Secondary | ICD-10-CM

## 2017-02-16 DIAGNOSIS — Z23 Encounter for immunization: Secondary | ICD-10-CM

## 2017-02-16 DIAGNOSIS — F410 Panic disorder [episodic paroxysmal anxiety] without agoraphobia: Secondary | ICD-10-CM

## 2017-02-16 DIAGNOSIS — Z79899 Other long term (current) drug therapy: Secondary | ICD-10-CM

## 2017-02-16 DIAGNOSIS — F064 Anxiety disorder due to known physiological condition: Secondary | ICD-10-CM

## 2017-02-16 DIAGNOSIS — I10 Essential (primary) hypertension: Secondary | ICD-10-CM

## 2017-02-16 DIAGNOSIS — B20 Human immunodeficiency virus [HIV] disease: Secondary | ICD-10-CM

## 2017-02-16 NOTE — Assessment & Plan Note (Signed)
Continues on anti-epilieptic.  sz free.  F/u pcp

## 2017-02-16 NOTE — Progress Notes (Signed)
   Subjective:    Patient ID: Javier Hill, male    DOB: 08/12/1981, 36 y.o.   MRN: [id number]  HPI 36 yo M with hx of HIV+ since 2013, currently on ISN/TRV (his only regiemen). Previously offered FDC but refused.  He also has a hx of seizures and HTN.  No problems with ART.  Has his anti-htn rx.  Was in hospital 06-2016 with blood per rectum. He left ama prior to endoscopy.  He has had no recurrences.  Normal EEG 06-2016.  He has been sz free.  He was seen by his PCP on 02-02-17 with anxiety- he was started on cymbalta and hydroxyzine.    HIV 1 RNA Quant (copies/mL)  Date Value  11/19/2016 <20 NOT DETECTED  02/12/2016 <20  05/06/2015 <20   CD4 T Cell Abs (/uL)  Date Value  11/19/2016 780  02/12/2016 1,050  05/06/2015 850    Review of Systems  Constitutional: Negative for appetite change and unexpected weight change.  Eyes: Negative for visual disturbance.  Gastrointestinal: Negative for blood in stool, diarrhea and nausea.  Genitourinary: Negative for difficulty urinating.  Neurological: Positive for headaches. Negative for seizures.   I repeated his HR at 88bpm.     Objective:   Physical Exam  Constitutional: He appears well-developed and well-nourished.  HENT:  Mouth/Throat: No oropharyngeal exudate.  Eyes: EOM are normal. Pupils are equal, round, and reactive to light.  Neck: Neck supple.  Cardiovascular: Normal rate, regular rhythm and normal heart sounds.   Pulmonary/Chest: Effort normal and breath sounds normal.  Abdominal: Soft. Bowel sounds are normal. There is no tenderness. There is no rebound.  Musculoskeletal: He exhibits no edema.  Lymphadenopathy:    He has no cervical adenopathy.  Psychiatric: He has a normal mood and affect.      Assessment & Plan:

## 2017-02-16 NOTE — Assessment & Plan Note (Signed)
His BP is normal today.  He is taking his ACE-I. Took an extra 10mg  today.  Will f/u with PCP. He thinks she is "really nice".

## 2017-02-16 NOTE — Assessment & Plan Note (Signed)
cymbalta made him shaky and was not able to sleep. Had cry mouth as well.  Stopped.

## 2017-02-16 NOTE — Assessment & Plan Note (Addendum)
He is doing well with his ART.  Will give him menig vax today.  Does not want to mondernize his art to FDC. Or to to desc  Has condoms.  Will check lipids today rtc in 6 months.  Spoke to his partner- he will get HIV test today, will get screened for PREP study

## 2017-02-17 NOTE — Addendum Note (Signed)
Addended by: Lurlean LeydenPOOLE, TRAVIS F on: 02/17/2017 12:24 PM   Modules accepted: Orders

## 2017-02-19 ENCOUNTER — Other Ambulatory Visit: Payer: Self-pay | Admitting: Infectious Diseases

## 2017-02-19 DIAGNOSIS — B20 Human immunodeficiency virus [HIV] disease: Secondary | ICD-10-CM

## 2017-03-04 ENCOUNTER — Ambulatory Visit: Payer: Self-pay | Admitting: Family Medicine

## 2017-03-26 ENCOUNTER — Other Ambulatory Visit: Payer: Self-pay | Admitting: Infectious Diseases

## 2017-03-26 DIAGNOSIS — B20 Human immunodeficiency virus [HIV] disease: Secondary | ICD-10-CM

## 2017-05-03 ENCOUNTER — Other Ambulatory Visit: Payer: Self-pay | Admitting: Infectious Diseases

## 2017-05-03 DIAGNOSIS — B20 Human immunodeficiency virus [HIV] disease: Secondary | ICD-10-CM

## 2017-05-18 NOTE — Telephone Encounter (Signed)
Tomasita Morrow, RN

## 2017-07-26 NOTE — Telephone Encounter (Signed)
Checking labs

## 2017-07-26 NOTE — Telephone Encounter (Signed)
Checking lab orders

## 2017-08-23 ENCOUNTER — Ambulatory Visit: Payer: Self-pay | Admitting: Infectious Diseases

## 2018-07-29 ENCOUNTER — Telehealth: Payer: Self-pay

## 2018-07-29 NOTE — Telephone Encounter (Signed)
Attempted to reach out to patient to inform him that Dr.Hatcher has been promoted, and is reassigning his patients to other providers in our office. Unable to reach patient at this time. Left a voicemail for patient to call office back. Javier Hill, [place]
